# Patient Record
Sex: Male | Born: 1965 | Race: Black or African American | Hispanic: No | Marital: Single | Smoking: Current every day smoker
Health system: Southern US, Community
[De-identification: ages and names within clinical notes are randomized; demographics above are authoritative.]

## PROBLEM LIST (undated history)

## (undated) DIAGNOSIS — I10 Essential (primary) hypertension: Secondary | ICD-10-CM

## (undated) DIAGNOSIS — K219 Gastro-esophageal reflux disease without esophagitis: Secondary | ICD-10-CM

## (undated) DIAGNOSIS — F259 Schizoaffective disorder, unspecified: Secondary | ICD-10-CM

## (undated) DIAGNOSIS — K000 Anodontia: Secondary | ICD-10-CM

## (undated) DIAGNOSIS — F1910 Other psychoactive substance abuse, uncomplicated: Secondary | ICD-10-CM

## (undated) DIAGNOSIS — Z933 Colostomy status: Secondary | ICD-10-CM

## (undated) DIAGNOSIS — C801 Malignant (primary) neoplasm, unspecified: Secondary | ICD-10-CM

## (undated) DIAGNOSIS — K08109 Complete loss of teeth, unspecified cause, unspecified class: Secondary | ICD-10-CM

## (undated) DIAGNOSIS — D649 Anemia, unspecified: Secondary | ICD-10-CM

## (undated) DIAGNOSIS — Z22322 Carrier or suspected carrier of Methicillin resistant Staphylococcus aureus: Secondary | ICD-10-CM

## (undated) DIAGNOSIS — F250 Schizoaffective disorder, bipolar type: Secondary | ICD-10-CM

## (undated) HISTORY — PX: COLOSTOMY: SHX63

## (undated) HISTORY — PX: OTHER SURGICAL HISTORY: SHX169

---

## 2015-04-07 ENCOUNTER — Emergency Department: Payer: Medicaid Other

## 2015-04-07 ENCOUNTER — Encounter: Payer: Self-pay | Admitting: Emergency Medicine

## 2015-04-07 ENCOUNTER — Inpatient Hospital Stay
Admission: EM | Admit: 2015-04-07 | Discharge: 2015-04-09 | DRG: 378 | Payer: Medicaid Other | Attending: Internal Medicine | Admitting: Internal Medicine

## 2015-04-07 DIAGNOSIS — E870 Hyperosmolality and hypernatremia: Secondary | ICD-10-CM | POA: Diagnosis present

## 2015-04-07 DIAGNOSIS — Z22322 Carrier or suspected carrier of Methicillin resistant Staphylococcus aureus: Secondary | ICD-10-CM

## 2015-04-07 DIAGNOSIS — Z933 Colostomy status: Secondary | ICD-10-CM | POA: Diagnosis not present

## 2015-04-07 DIAGNOSIS — Z880 Allergy status to penicillin: Secondary | ICD-10-CM

## 2015-04-07 DIAGNOSIS — I10 Essential (primary) hypertension: Secondary | ICD-10-CM | POA: Diagnosis present

## 2015-04-07 DIAGNOSIS — F17200 Nicotine dependence, unspecified, uncomplicated: Secondary | ICD-10-CM | POA: Diagnosis present

## 2015-04-07 DIAGNOSIS — G8929 Other chronic pain: Secondary | ICD-10-CM | POA: Diagnosis present

## 2015-04-07 DIAGNOSIS — F259 Schizoaffective disorder, unspecified: Secondary | ICD-10-CM | POA: Diagnosis present

## 2015-04-07 DIAGNOSIS — K922 Gastrointestinal hemorrhage, unspecified: Principal | ICD-10-CM | POA: Diagnosis present

## 2015-04-07 DIAGNOSIS — K219 Gastro-esophageal reflux disease without esophagitis: Secondary | ICD-10-CM | POA: Diagnosis present

## 2015-04-07 DIAGNOSIS — Z79899 Other long term (current) drug therapy: Secondary | ICD-10-CM | POA: Diagnosis not present

## 2015-04-07 DIAGNOSIS — F319 Bipolar disorder, unspecified: Secondary | ICD-10-CM | POA: Diagnosis present

## 2015-04-07 DIAGNOSIS — Z9049 Acquired absence of other specified parts of digestive tract: Secondary | ICD-10-CM | POA: Diagnosis not present

## 2015-04-07 HISTORY — DX: Essential (primary) hypertension: I10

## 2015-04-07 HISTORY — DX: Gastro-esophageal reflux disease without esophagitis: K21.9

## 2015-04-07 HISTORY — DX: Schizoaffective disorder, bipolar type: F25.0

## 2015-04-07 HISTORY — DX: Schizoaffective disorder, unspecified: F25.9

## 2015-04-07 HISTORY — DX: Other psychoactive substance abuse, uncomplicated: F19.10

## 2015-04-07 HISTORY — DX: Colostomy status: Z93.3

## 2015-04-07 LAB — CBC
HCT: 33.5 % — ABNORMAL LOW (ref 40.0–52.0)
HCT: 32.5 % — ABNORMAL LOW (ref 40.0–52.0)
HEMOGLOBIN: 10.9 g/dL — AB (ref 13.0–18.0)
Hemoglobin: 10.8 g/dL — ABNORMAL LOW (ref 13.0–18.0)
MCH: 31.9 pg (ref 26.0–34.0)
MCH: 32.8 pg (ref 26.0–34.0)
MCHC: 32.4 g/dL (ref 32.0–36.0)
MCHC: 33.6 g/dL (ref 32.0–36.0)
MCV: 98.5 fL (ref 80.0–100.0)
MCV: 97.6 fL (ref 80.0–100.0)
PLATELETS: 226 10*3/uL (ref 150–440)
Platelets: 213 10*3/uL (ref 150–440)
RBC: 3.4 MIL/uL — ABNORMAL LOW (ref 4.40–5.90)
RBC: 3.33 MIL/uL — ABNORMAL LOW (ref 4.40–5.90)
RDW: 16 % — ABNORMAL HIGH (ref 11.5–14.5)
RDW: 15.7 % — AB (ref 11.5–14.5)
WBC: 8.7 10*3/uL (ref 3.8–10.6)
WBC: 7.1 10*3/uL (ref 3.8–10.6)

## 2015-04-07 LAB — VALPROIC ACID LEVEL: Valproic Acid Lvl: 87 ug/mL (ref 50.0–100.0)

## 2015-04-07 LAB — COMPREHENSIVE METABOLIC PANEL
ALK PHOS: 54 U/L (ref 38–126)
ALT: 11 U/L — AB (ref 17–63)
ANION GAP: 4 — AB (ref 5–15)
AST: 15 U/L (ref 15–41)
Albumin: 3.6 g/dL (ref 3.5–5.0)
BILIRUBIN TOTAL: 0.5 mg/dL (ref 0.3–1.2)
BUN: 12 mg/dL (ref 6–20)
CALCIUM: 9.8 mg/dL (ref 8.9–10.3)
CO2: 28 mmol/L (ref 22–32)
CREATININE: 1.43 mg/dL — AB (ref 0.61–1.24)
Chloride: 113 mmol/L — ABNORMAL HIGH (ref 101–111)
GFR, EST NON AFRICAN AMERICAN: 57 mL/min — AB (ref >60)
Glucose, Bld: 92 mg/dL (ref 65–99)
Potassium: 4.1 mmol/L (ref 3.5–5.1)
SODIUM: 145 mmol/L (ref 135–145)
TOTAL PROTEIN: 6.5 g/dL (ref 6.5–8.1)

## 2015-04-07 LAB — CBC WITH DIFFERENTIAL/PLATELET
Basophils Absolute: 0 10*3/uL (ref 0–0.1)
Basophils Relative: 0 %
EOS ABS: 0.3 10*3/uL (ref 0–0.7)
Eosinophils Relative: 4 %
HEMATOCRIT: 34.7 % — AB (ref 40.0–52.0)
HEMOGLOBIN: 11.3 g/dL — AB (ref 13.0–18.0)
LYMPHS ABS: 1.7 10*3/uL (ref 1.0–3.6)
LYMPHS PCT: 24 %
MCH: 31.9 pg (ref 26.0–34.0)
MCHC: 32.5 g/dL (ref 32.0–36.0)
MCV: 98.3 fL (ref 80.0–100.0)
MONOS PCT: 9 %
Monocytes Absolute: 0.7 10*3/uL (ref 0.2–1.0)
NEUTROS PCT: 63 %
Neutro Abs: 4.6 10*3/uL (ref 1.4–6.5)
Platelets: 220 10*3/uL (ref 150–440)
RBC: 3.53 MIL/uL — ABNORMAL LOW (ref 4.40–5.90)
RDW: 16.3 % — ABNORMAL HIGH (ref 11.5–14.5)
WBC: 7.4 10*3/uL (ref 3.8–10.6)

## 2015-04-07 LAB — TYPE AND SCREEN
ABO/RH(D): A POS
ANTIBODY SCREEN: NEGATIVE

## 2015-04-07 LAB — MRSA PCR SCREENING: MRSA BY PCR: POSITIVE — AB

## 2015-04-07 LAB — ABO/RH: ABO/RH(D): A POS

## 2015-04-07 MED ORDER — ONDANSETRON HCL 4 MG/2ML IJ SOLN: 4.0000 mg | Freq: Four times a day (QID) | INTRAMUSCULAR | Status: DC | PRN

## 2015-04-07 MED ORDER — LAMOTRIGINE 25 MG PO TABS
50.0000 mg | ORAL_TABLET | Freq: Two times a day (BID) | ORAL | Status: DC
  Administered 2015-04-07 – 2015-04-09 (×4): 50 mg via ORAL
  Filled 2015-04-07 (×4): qty 2

## 2015-04-07 MED ORDER — CLOZAPINE 100 MG PO TABS
300.0000 mg | ORAL_TABLET | Freq: Every day | ORAL | Status: DC
  Administered 2015-04-07 – 2015-04-08 (×2): 300 mg via ORAL
  Filled 2015-04-07 (×3): qty 3

## 2015-04-07 MED ORDER — CLOZAPINE 100 MG PO TABS
150.0000 mg | ORAL_TABLET | ORAL | Status: DC
  Administered 2015-04-08 – 2015-04-09 (×2): 150 mg via ORAL
  Filled 2015-04-07 (×3): qty 2

## 2015-04-07 MED ORDER — SODIUM CHLORIDE 0.9 % IV BOLUS (SEPSIS)
1000.0000 mL | Freq: Once | INTRAVENOUS | Status: AC
  Administered 2015-04-07: 1000 mL via INTRAVENOUS

## 2015-04-07 MED ORDER — SODIUM CHLORIDE 0.9 % IV SOLN
INTRAVENOUS | Status: DC
  Administered 2015-04-07 – 2015-04-08 (×2): via INTRAVENOUS

## 2015-04-07 MED ORDER — AMLODIPINE BESYLATE 10 MG PO TABS
10.0000 mg | ORAL_TABLET | Freq: Every day | ORAL | Status: DC
  Administered 2015-04-08 – 2015-04-09 (×2): 10 mg via ORAL
  Filled 2015-04-07 (×2): qty 1

## 2015-04-07 MED ORDER — ONDANSETRON HCL 4 MG PO TABS: 4.0000 mg | ORAL_TABLET | Freq: Four times a day (QID) | ORAL | Status: DC | PRN

## 2015-04-07 MED ORDER — GLYCOPYRROLATE 1 MG PO TABS
1.0000 mg | ORAL_TABLET | Freq: Three times a day (TID) | ORAL | Status: DC
  Administered 2015-04-07 – 2015-04-09 (×5): 1 mg via ORAL
  Filled 2015-04-07 (×7): qty 1

## 2015-04-07 MED ORDER — LAMOTRIGINE 25 MG PO TABS
50.0000 mg | ORAL_TABLET | Freq: Two times a day (BID) | ORAL | Status: DC
  Filled 2015-04-07: qty 2

## 2015-04-07 MED ORDER — MORPHINE SULFATE (PF) 2 MG/ML IV SOLN
1.0000 mg | Freq: Four times a day (QID) | INTRAVENOUS | Status: DC | PRN
  Administered 2015-04-07: 1 mg via INTRAVENOUS
  Filled 2015-04-07: qty 1

## 2015-04-07 MED ORDER — PANTOPRAZOLE SODIUM 40 MG PO TBEC
40.0000 mg | DELAYED_RELEASE_TABLET | Freq: Every day | ORAL | Status: DC
  Administered 2015-04-08 – 2015-04-09 (×2): 40 mg via ORAL
  Filled 2015-04-07 (×2): qty 1

## 2015-04-07 MED ORDER — CHLORHEXIDINE GLUCONATE CLOTH 2 % EX PADS
6.0000 | MEDICATED_PAD | Freq: Every day | CUTANEOUS | Status: DC
  Administered 2015-04-08 – 2015-04-09 (×2): 6 via TOPICAL

## 2015-04-07 MED ORDER — SODIUM CHLORIDE 0.9 % IV SOLN
80.0000 mg | Freq: Once | INTRAVENOUS | Status: AC
  Administered 2015-04-07: 80 mg via INTRAVENOUS
  Filled 2015-04-07 (×2): qty 80

## 2015-04-07 MED ORDER — MUPIROCIN 2 % EX OINT
1.0000 "application " | TOPICAL_OINTMENT | Freq: Two times a day (BID) | CUTANEOUS | Status: DC
  Administered 2015-04-08 – 2015-04-09 (×4): 1 via NASAL
  Filled 2015-04-07 (×2): qty 22

## 2015-04-07 MED ORDER — ADULT MULTIVITAMIN W/MINERALS CH
1.0000 | ORAL_TABLET | Freq: Every day | ORAL | Status: DC
  Administered 2015-04-08 – 2015-04-09 (×2): 1 via ORAL
  Filled 2015-04-07 (×3): qty 1

## 2015-04-07 MED ORDER — IOHEXOL 300 MG/ML  SOLN
100.0000 mL | Freq: Once | INTRAMUSCULAR | Status: AC | PRN
  Administered 2015-04-07: 100 mL via INTRAVENOUS

## 2015-04-07 MED ORDER — IOHEXOL 240 MG/ML SOLN
25.0000 mL | Freq: Once | INTRAMUSCULAR | Status: AC | PRN
  Administered 2015-04-07: 25 mL via ORAL

## 2015-04-07 MED ORDER — PANTOPRAZOLE SODIUM 40 MG PO TBEC
40.0000 mg | DELAYED_RELEASE_TABLET | Freq: Every day | ORAL | Status: DC
Start: 2015-04-07 — End: 2015-04-07

## 2015-04-07 MED ORDER — VALPROIC ACID 250 MG PO CAPS
750.0000 mg | ORAL_CAPSULE | Freq: Two times a day (BID) | ORAL | Status: DC
  Administered 2015-04-07 – 2015-04-08 (×2): 750 mg via ORAL
  Filled 2015-04-07 (×2): qty 3

## 2015-04-07 MED ORDER — AMLODIPINE BESYLATE 10 MG PO TABS
10.0000 mg | ORAL_TABLET | Freq: Every day | ORAL | Status: DC
  Filled 2015-04-07 (×2): qty 2

## 2015-04-07 MED ORDER — VALPROIC ACID 250 MG PO CAPS
750.0000 mg | ORAL_CAPSULE | Freq: Three times a day (TID) | ORAL | Status: DC
  Filled 2015-04-07 (×2): qty 3

## 2015-04-07 NOTE — H&P (Signed)
[place] [facility] NAME: Mario Ortega    MR#:  [id number]  DATE OF BIRTH:  10-26-1965  DATE OF ADMISSION:  04/07/2015  PRIMARY CARE PHYSICIAN: Christie Nottingham., PA   REQUESTING/REFERRING PHYSICIAN: [email or web address]  CHIEF COMPLAINT:   Chief Complaint  Patient presents with  . Rectal Bleeding    HISTORY OF PRESENT ILLNESS:  Mario Ortega  is a 49 y.o. male with a known history of hypertension, schizoaffective disorder with bipolar disorder, history of recent bowel perforation requiring colostomy comes from family care home because of bloody stool in his colostomy bag. Patient is a poor historian, according to the staff at family care home patient the by mouth intake is poor since yesterday associated mild abdominal pain, today morning when they tried to change her colostomy began to noticed blood in the colostomy bag mixed with stool. PAST MEDICAL HISTORY:   Past Medical History  Diagnosis Date  . Colostomy in place [facility])   . Hypertension   . Schizo affective schizophrenia ([place])   . GERD (gastroesophageal reflux disease)   . Drug abuse     PAST SURGICAL HISTOIRY:  History reviewed. No pertinent past surgical history.  SOCIAL HISTORY:   Social History  Substance Use Topics  . Smoking status: Current Every Day Smoker  . Smokeless tobacco: Not on file  . Alcohol Use: No    FAMILY HISTORY:  History reviewed. No pertinent family history.  DRUG ALLERGIES:   Allergies  Allergen Reactions  . Penicillins Rash    REVIEW OF SYSTEMS:  CONSTITUTIONAL: No fever, fatigue or weakness.  EYES: No blurred or double vision.  EARS, NOSE, AND THROAT: No tinnitus or ear pain.  RESPIRATORY: No cough, shortness of breath, wheezing or hemoptysis.  CARDIOVASCULAR: No chest pain, orthopnea, edema.  GASTROINTESTINAL:Complains of abdominal pain.  : No dysuria, hematuria.  ENDOCRINE: No polyuria, nocturia,  HEMATOLOGY: No anemia, easy  bruising or bleeding SKIN: No rash or lesion. MUSCULOSKELETAL: No joint pain or arthritis.   NEUROLOGIC: No tingling, numbness, weakness.  PSYCHIATRY: No anxiety or depression.   MEDICATIONS AT HOME:   Prior to Admission medications   Medication Sig Start Date End Date Taking? Authorizing Provider  amLODipine (NORVASC) 10 MG tablet Take 10 mg by mouth daily.   Yes Historical Provider, MD  cloZAPine (CLOZARIL) 100 MG tablet Take 150 mg by mouth every morning.   Yes Historical Provider, MD  clozapine (CLOZARIL) 200 MG tablet Take 300 mg by mouth at bedtime.   Yes Historical Provider, MD  glycopyrrolate (ROBINUL) 1 MG tablet Take 1 mg by mouth 3 (three) times daily.   Yes Historical Provider, MD  lamoTRIgine (LAMICTAL) 100 MG tablet Take 50 mg by mouth 2 (two) times daily.   Yes Historical Provider, MD  Multiple Vitamins-Minerals (MULTIVITAMIN WITH MINERALS) tablet Take 1 tablet by mouth daily.   Yes Historical Provider, MD  omeprazole (PRILOSEC) 20 MG capsule Take 20 mg by mouth every morning.   Yes Historical Provider, MD  valproic acid (DEPAKENE) 250 MG capsule Take 750 mg by mouth 3 (three) times daily.   Yes Historical Provider, MD      VITAL SIGNS:  Blood pressure 129/89, pulse 78, temperature 98 F (36.7 C), temperature source Oral, resp. rate 12, height 5\' 11"  (1.803 m), weight 69.854 kg (154 lb), SpO2 100 %.  PHYSICAL EXAMINATION:  GENERAL:  49 y.o.-year-old patient lying in the bed with no acute distress.  EYES: Pupils equal, round, reactive  to light and accommodation. No scleral icterus. Extraocular muscles intact.  HEENT: Head atraumatic, normocephalic. Oropharynx and nasopharynx clear.  NECK:  Supple, no jugular venous distention. No thyroid enlargement, no tenderness.  LUNGS: Normal breath sounds bilaterally, no wheezing, rales,rhonchi or crepitation. No use of accessory muscles of respiration.  CARDIOVASCULAR: S1, S2 normal. No murmurs, rubs, or gallops.  ABDOMEN: Soft  nontender, colostomy bag filled with stool and blood present in the right lower quadrant. Stoma site is clean without evidence of infection. EXTREMITIES: No pedal edema, cyanosis, or clubbing.  NEUROLOGIC: Cranial nerves II through XII are intact. Muscle strength 5/5 in all extremities. Sensation intact. Gait not checked.  PSYCHIATRIC: The patient is alert and oriented x 3.  SKIN: No obvious rash, lesion, or ulcer.   LABORATORY PANEL:   CBC  Recent Labs Lab 04/07/15 1010  WBC 7.4  HGB 11.3*  HCT 34.7*  PLT 220   ------------------------------------------------------------------------------------------------------------------  Chemistries   Recent Labs Lab 04/07/15 1010  NA 145  K 4.1  CL 113*  CO2 28  GLUCOSE 92  BUN 12  CREATININE 1.43*  CALCIUM 9.8  AST 15  ALT 11*  ALKPHOS 54  BILITOT 0.5   ------------------------------------------------------------------------------------------------------------------  Cardiac Enzymes No results for input(s): TROPONINI in the last 168 hours. ------------------------------------------------------------------------------------------------------------------  RADIOLOGY:  Ct Abdomen Pelvis W Contrast  04/07/2015   CLINICAL DATA:  Generalized abdominal and pelvic pain, bleeding from colostomy, smoker  EXAM: CT ABDOMEN AND PELVIS WITH CONTRAST  TECHNIQUE: Multidetector CT imaging of the abdomen and pelvis was performed using the standard protocol following bolus administration of intravenous contrast. Sagittal and coronal MPR images reconstructed from axial data set.  CONTRAST:  113mL OMNIPAQUE IOHEXOL 300 MG/ML SOLN IV. Dilute oral contrast.  COMPARISON:  None  FINDINGS: Atelectasis versus infiltrate in posterior RIGHT lower lobe.  Tiny nonobstructing LEFT renal calculi.  6 mm lesion anterior mid LEFT kidney image 30, question tiny cyst.  Liver, spleen, pancreas, kidneys, and adrenal glands otherwise unremarkable.  Contracted gallbladder  containing intermediate attenuation material, unable to exclude stones or sludge.  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  RIGHT lower quadrant ostomy question ileostomy.  Stomach and small bowel loops normal appearance.  Minimal stranding at the LEFT pericolic gutter may be related to recent colon surgery.  No mass, adenopathy, free fluid, free air, or inflammatory process.  Postsurgical changes of the anterior abdominal wall at midline.  Bladder and ureters unremarkable.  No hernia or acute bone lesion.  IMPRESSION: RIGHT lower quadrant ostomy question ileostomy with suspect prior subtotal colectomy.  Tiny nonobstructing LEFT renal calculi.  Question sludge versus noncalcified calculi in gallbladder.   Electronically Signed   By: Lavonia Dana M.D.   On: 04/07/2015 11:55    EKG:  No orders found for this or any previous visit.  IMPRESSION AND PLAN:  1. GI bleed likely lower GI bleed: Patient has history of bipolar and schizoaffective desires were not able to give a history but the patient to the will be admitted for GI bleed because of the blood in the colostomy bag. He'll have a CBC every 6 hours, nothing by mouth, IV fluids. GI consult . Continue IV PPIs.\  #2 history of bowel perforation with recent stop stopped her to colectomy and ileostomy: Patient was at [facility], regional  Recently.  3. Bipolar disorder, schizoaffective disorder: Patient had recent lithium toxicity and was at [facility] for about a month and recently discharged on Friday from there to family  Care home,  #4 hypertension controlled   All the records are reviewed and case discussed with ED provider. Management plans discussed with the patient, family and they are in agreement.  CODE STATUS:Full   TOTAL TIME TAKING CARE OF THIS PATIENT: 55 minutes.    Epifanio Lesches M.D on 04/07/2015 at 1:09 PM  Between 7am to 6pm - Pager - [phone]  After 6pm go to [email or web address] - password  [facility]  [place] [facility]  Office  [phone]  CC: Primary care physician; Christie Nottingham., PA

## 2015-04-07 NOTE — Plan of Care (Signed)
Problem: Discharge Progression Outcomes Goal: Other Discharge Outcomes/Goals Outcome: Progressing Plan of Care Progressing to Goal: Pain medications given with noted relief. VSS. Remains NPO. IV fluids infusing.

## 2015-04-07 NOTE — Progress Notes (Addendum)
Dr Vianne Bulls -1 mg morphine IV everys 6 hours PRN, valproic acid 750 mg oral BID, cancel other valproic acid order

## 2015-04-07 NOTE — ED Notes (Signed)
Right lower quadrant colostomy changed due to leakage.

## 2015-04-07 NOTE — ED Notes (Signed)
Pt returned from CT °

## 2015-04-07 NOTE — ED Notes (Signed)
Patient transported to CT 

## 2015-04-07 NOTE — Progress Notes (Signed)
MEDICATION RELATED CONSULT NOTE - INITIAL   Pharmacy Consult for clozapine monitoring  Allergies  Allergen Reactions  . Penicillins Rash   Labs:  Recent Labs  04/07/15 1010  WBC 7.4  HGB 11.3*  HCT 34.7*  PLT 220  CREATININE 1.43*  ALBUMIN 3.6  PROT 6.5  AST 15  ALT 11*  ALKPHOS 54  BILITOT 0.5   Estimated Creatinine Clearance: 62.5 mL/min (by C-G formula based on Cr of 1.43).  Medical History: Past Medical History  Diagnosis Date  . Colostomy in place [facility])   . Hypertension   . Schizo affective schizophrenia ([place])   . GERD (gastroesophageal reflux disease)   . Drug abuse     Medications:  Scheduled:  . amLODipine  10 mg Oral Daily  . [START ON 04/08/2015] cloZAPine  150 mg Oral BH-q7a  . clozapine  300 mg Oral QHS  . glycopyrrolate  1 mg Oral TID  . lamoTRIgine  50 mg Oral BID  . multivitamin with minerals  1 tablet Oral Daily  . pantoprazole  40 mg Oral Daily  . valproic acid  750 mg Oral TID    Assessment: Pharmacy consult entered to monitor [facility] on this 49 year old patient admitted for a GI bleed. Patient was taking clozapine prior to admission. Pharmacy will monitor and report lab values to the clozapine REMS program.  Plan:  Patient was admitted from family care home for GI bleed. Patient was taking clozapine prior to admission.   Labs obtained today: WBC 4.7, ANC 4600  Labs have been reported to the clozapine REMS registry, labs have been saved, patient meets criteria to dispense medication and is currently on weekly monitoring.   Continue home dose of clozapine 150 mg qAM and 300 mg qHS. Pharmacy will continue to monitor.  Darylene Price Lynde Ludwig 04/07/2015,2:58 PM

## 2015-04-07 NOTE — ED Notes (Signed)
Pt to ed with c/o bloody stools from colostomy bag since this am.  Pt with reddish color stools. Denies abd pain.

## 2015-04-07 NOTE — ED Provider Notes (Signed)
CSN: [id number]     Arrival date & time 04/07/15  0915 History   First MD Initiated Contact with Patient 04/07/15 [phone]     Chief Complaint  Patient presents with  . Rectal Bleeding     (Consider location/radiation/quality/duration/timing/severity/associated sxs/prior Treatment) The history is provided by the patient, the EMS personnel and a caregiver.  Mario Ortega is a 49 y.o. male hx of schizoaffective, recent bowel perforation causing sepsis requiring colostomy bag that during regional here presenting with blood in colostomy bag. He woke up this morning and the colostomy bag fell off and he noticed bloody stools in the bag. Has chronic abdominal pain is getting worse but denies any vomiting or fevers. States that he is still passing gas. He is not on any blood thinners. He is still on Clozaril, lamictal, and depakote. During the hospitalization at [facility], he was also found to be lithium toxic and briefly required dialysis but no longer on dialysis.    Past Medical History  Diagnosis Date  . Colostomy in place [facility])   . Hypertension   . Schizo affective schizophrenia ([place])   . GERD (gastroesophageal reflux disease)   . Drug abuse    History reviewed. No pertinent past surgical history. History reviewed. No pertinent family history. Social History  Substance Use Topics  . Smoking status: Current Every Day Smoker  . Smokeless tobacco: None  . Alcohol Use: No    Review of Systems  Gastrointestinal: Positive for blood in stool and hematochezia.  All other systems reviewed and are negative.     Allergies  Penicillins  Home Medications   Prior to Admission medications   Medication Sig Start Date End Date Taking? Authorizing Provider  amLODipine (NORVASC) 10 MG tablet Take 10 mg by mouth daily.   Yes Historical Provider, MD  cloZAPine (CLOZARIL) 100 MG tablet Take 150 mg by mouth every morning.   Yes Historical Provider, MD  clozapine (CLOZARIL) 200 MG tablet  Take 300 mg by mouth at bedtime.   Yes Historical Provider, MD  glycopyrrolate (ROBINUL) 1 MG tablet Take 1 mg by mouth 3 (three) times daily.   Yes Historical Provider, MD  lamoTRIgine (LAMICTAL) 100 MG tablet Take 50 mg by mouth 2 (two) times daily.   Yes Historical Provider, MD  Multiple Vitamins-Minerals (MULTIVITAMIN WITH MINERALS) tablet Take 1 tablet by mouth daily.   Yes Historical Provider, MD  omeprazole (PRILOSEC) 20 MG capsule Take 20 mg by mouth every morning.   Yes Historical Provider, MD  valproic acid (DEPAKENE) 250 MG capsule Take 750 mg by mouth 3 (three) times daily.   Yes Historical Provider, MD   BP 129/89 mmHg  Pulse 78  Temp(Src) 98 F (36.7 C) (Oral)  Resp 12  Ht 5\' 11"  (1.803 m)  Wt 154 lb (69.854 kg)  BMI 21.49 kg/m2  SpO2 100% Physical Exam  Constitutional: He is oriented to person, place, and time.  Chronically ill, dehydrated   HENT:  Head: Normocephalic.  MM dry, slightly pale   Eyes: EOM are normal. Pupils are equal, round, and reactive to light.  Slightly pale   Neck: Normal range of motion. Neck supple.  Cardiovascular: Normal rate, regular rhythm and normal heart sounds.   Pulmonary/Chest: Effort normal and breath sounds normal. No respiratory distress. He has no wheezes. He has no rales.  Abdominal:  Multiple surgical scars. Colostomy bag with reddish stool. Guiac positive   Musculoskeletal: Normal range of motion. He exhibits no edema or tenderness.  Neurological:  He is alert and oriented to person, place, and time. No cranial nerve deficit. Coordination normal.  Skin: Skin is warm and dry.  Psychiatric:  Unable   Nursing note and vitals reviewed.   ED Course  Procedures (including critical care time) Labs Review Labs Reviewed  CBC WITH DIFFERENTIAL/PLATELET - Abnormal; Notable for the following:    RBC 3.53 (*)    Hemoglobin 11.3 (*)    HCT 34.7 (*)    RDW 16.3 (*)    All other components within normal limits  COMPREHENSIVE METABOLIC  PANEL - Abnormal; Notable for the following:    Chloride 113 (*)    Creatinine, Ser 1.43 (*)    ALT 11 (*)    GFR calc non Af Amer 57 (*)    Anion gap 4 (*)    All other components within normal limits  VALPROIC ACID LEVEL  LAMOTRIGINE LEVEL  TYPE AND SCREEN  ABO/RH    Imaging Review Ct Abdomen Pelvis W Contrast  04/07/2015   CLINICAL DATA:  Generalized abdominal and pelvic pain, bleeding from colostomy, smoker  EXAM: CT ABDOMEN AND PELVIS WITH CONTRAST  TECHNIQUE: Multidetector CT imaging of the abdomen and pelvis was performed using the standard protocol following bolus administration of intravenous contrast. Sagittal and coronal MPR images reconstructed from axial data set.  CONTRAST:  111mL OMNIPAQUE IOHEXOL 300 MG/ML SOLN IV. Dilute oral contrast.  COMPARISON:  None  FINDINGS: Atelectasis versus infiltrate in posterior RIGHT lower lobe.  Tiny nonobstructing LEFT renal calculi.  6 mm lesion anterior mid LEFT kidney image 30, question tiny cyst.  Liver, spleen, pancreas, kidneys, and adrenal glands otherwise unremarkable.  Contracted gallbladder containing intermediate attenuation material, unable to exclude stones or sludge.  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  RIGHT lower quadrant ostomy question ileostomy.  Stomach and small bowel loops normal appearance.  Minimal stranding at the LEFT pericolic gutter may be related to recent colon surgery.  No mass, adenopathy, free fluid, free air, or inflammatory process.  Postsurgical changes of the anterior abdominal wall at midline.  Bladder and ureters unremarkable.  No hernia or acute bone lesion.  IMPRESSION: RIGHT lower quadrant ostomy question ileostomy with suspect prior subtotal colectomy.  Tiny nonobstructing LEFT renal calculi.  Question sludge versus noncalcified calculi in gallbladder.   Electronically Signed   By: Lavonia Dana M.D.   On: 04/07/2015 11:55   I have personally reviewed and evaluated these images and lab  results as part of my medical decision-making.   EKG Interpretation None      MDM   Final diagnoses:  None   Mario Ortega is a 49 y.o. male here with blood in stool. Guiac positive, grossly bloody. Consider bleeding from anatamosis site vs GI bleed. Will check labs, type and screen, CT ab/pel. Will likely admit.   12:43 PM Hg 11. Given protonix. CT showed no obvious source of bleeding. Will admit for GI bleed.    Wandra Arthurs, MD 04/07/15 1245

## 2015-04-08 LAB — CBC
HCT: 32.2 % — ABNORMAL LOW (ref 40.0–52.0)
HCT: 34.5 % — ABNORMAL LOW (ref 40.0–52.0)
HEMOGLOBIN: 10.7 g/dL — AB (ref 13.0–18.0)
Hemoglobin: 11.2 g/dL — ABNORMAL LOW (ref 13.0–18.0)
MCH: 33.2 pg (ref 26.0–34.0)
MCH: 32.2 pg (ref 26.0–34.0)
MCHC: 33.2 g/dL (ref 32.0–36.0)
MCHC: 32.4 g/dL (ref 32.0–36.0)
MCV: 99.8 fL (ref 80.0–100.0)
MCV: 99.2 fL (ref 80.0–100.0)
PLATELETS: 220 10*3/uL (ref 150–440)
Platelets: 206 10*3/uL (ref 150–440)
RBC: 3.22 MIL/uL — AB (ref 4.40–5.90)
RBC: 3.48 MIL/uL — ABNORMAL LOW (ref 4.40–5.90)
RDW: 16.3 % — ABNORMAL HIGH (ref 11.5–14.5)
RDW: 16.3 % — ABNORMAL HIGH (ref 11.5–14.5)
WBC: 6.6 10*3/uL (ref 3.8–10.6)
WBC: 10.2 10*3/uL (ref 3.8–10.6)

## 2015-04-08 LAB — GLUCOSE, CAPILLARY: GLUCOSE-CAPILLARY: 71 mg/dL (ref 65–99)

## 2015-04-08 LAB — COMPREHENSIVE METABOLIC PANEL
ALBUMIN: 3 g/dL — AB (ref 3.5–5.0)
ALK PHOS: 43 U/L (ref 38–126)
ALT: 8 U/L — AB (ref 17–63)
AST: 11 U/L — AB (ref 15–41)
Anion gap: 5 (ref 5–15)
BUN: 11 mg/dL (ref 6–20)
CALCIUM: 9.6 mg/dL (ref 8.9–10.3)
CO2: 28 mmol/L (ref 22–32)
CREATININE: 1.28 mg/dL — AB (ref 0.61–1.24)
Chloride: 118 mmol/L — ABNORMAL HIGH (ref 101–111)
GFR calc Af Amer: >60 mL/min (ref >60)
GFR calc non Af Amer: >60 mL/min (ref >60)
GLUCOSE: 72 mg/dL (ref 65–99)
Potassium: 4.2 mmol/L (ref 3.5–5.1)
SODIUM: 151 mmol/L — AB (ref 135–145)
Total Bilirubin: 0.2 mg/dL — ABNORMAL LOW (ref 0.3–1.2)
Total Protein: 5.4 g/dL — ABNORMAL LOW (ref 6.5–8.1)

## 2015-04-08 MED ORDER — VALPROIC ACID 250 MG PO CAPS
750.0000 mg | ORAL_CAPSULE | Freq: Three times a day (TID) | ORAL | Status: DC
  Administered 2015-04-08 – 2015-04-09 (×3): 750 mg via ORAL
  Filled 2015-04-08 (×3): qty 3

## 2015-04-08 MED ORDER — DEXTROSE 5 % IV SOLN
INTRAVENOUS | Status: DC
  Administered 2015-04-08: 15:00:00 via INTRAVENOUS

## 2015-04-08 MED ORDER — PEG 3350-KCL-NA BICARB-NACL 420 G PO SOLR
4000.0000 mL | Freq: Once | ORAL | Status: DC
  Filled 2015-04-08: qty 4000

## 2015-04-08 NOTE — Plan of Care (Signed)
Problem: Discharge Progression Outcomes Goal: Other Discharge Outcomes/Goals Outcome: Progressing Pt is alert to self and location, no c/o pain at this time. Group home assistant at bedside, supportive. Positive for MRSA PCR, contact precautions initiated. Resting quietly, VSS.

## 2015-04-08 NOTE — Clinical Social Work Note (Signed)
Patient is from a [facility]. CSW completed FL2 to place on patient's chart but chart was unavailable. Full CSW assessment to follow. Shela Leff MSW,LCSW [phone]

## 2015-04-08 NOTE — Consult Note (Signed)
  Pt seen and examined. Please see D. Carlye Grippe' notes. No active bleeding right now. Green stool via colostomy. However, if pt wants can proceed with colonoscopy tomorrow after bowel prep. Thanks.

## 2015-04-08 NOTE — Plan of Care (Signed)
Problem: Discharge Progression Outcomes Goal: Other Discharge Outcomes/Goals Outcome: Progressing Plan of care progress to goal:  Patient is confused. No complaints of pain. NS discontinued - D5 started at 50 ml/hr. Diet advanced to clear liquid diet. Contact precautions remain for positive MRSA by PCR. GI consulted.

## 2015-04-08 NOTE — Progress Notes (Signed)
Patient ID: Mario Ortega, male   DOB: 09/20/1965, 49 y.o.   MRN: [id number] [facility] PROGRESS NOTE  PCP: Christie Nottingham., PA  HPI/Subjective: Patient asking for something to drink. No further bleeding. Hemoglobin remained stable. Patient is a poor historian. His sodium is very high at this point.  Objective: Filed Vitals:   04/08/15 0504  BP: 117/75  Pulse: 74  Temp: 98.1 F (36.7 C)  Resp: 18    Filed Weights   04/07/15 0925 04/07/15 1528  Weight: 69.854 kg (154 lb) 68.153 kg (150 lb 4 oz)    ROS: Review of Systems  Constitutional: Negative for fever and chills.  Eyes: Negative for blurred vision.  Respiratory: Negative for cough and shortness of breath.   Cardiovascular: Negative for chest pain.  Gastrointestinal: Positive for blood in stool. Negative for nausea, vomiting, abdominal pain, diarrhea and constipation.  Genitourinary: Negative for dysuria.  Musculoskeletal: Negative for joint pain.  Neurological: Negative for dizziness and headaches.   Exam: Physical Exam  HENT:  Nose: No mucosal edema.  Mouth/Throat: No oropharyngeal exudate or posterior oropharyngeal edema.  Eyes: Conjunctivae, EOM and lids are normal. Pupils are equal, round, and reactive to light.  Neck: No JVD present. Carotid bruit is not present. No edema present. No thyroid mass and no thyromegaly present.  Cardiovascular: S1 normal and S2 normal.  Exam reveals no gallop.   Murmur heard.  Systolic murmur is present with a grade of 2/6  Pulses:      Dorsalis pedis pulses are 2+ on the right side, and 2+ on the left side.  Respiratory: No respiratory distress. He has no wheezes. He has no rhonchi. He has no rales.  GI: Soft. Bowel sounds are normal. There is no tenderness.  Musculoskeletal:       Right ankle: He exhibits no swelling.       Left ankle: He exhibits no swelling.  Lymphadenopathy:    He has no cervical adenopathy.  Neurological: He is alert. No cranial nerve  deficit.  Skin: Skin is warm. No rash noted. Nails show no clubbing.  Psychiatric: He has a normal mood and affect.    Data Reviewed: Basic Metabolic Panel:  Recent Labs Lab 04/07/15 1010 04/08/15 0438  NA 145 151*  K 4.1 4.2  CL 113* 118*  CO2 28 28  GLUCOSE 92 72  BUN 12 11  CREATININE 1.43* 1.28*  CALCIUM 9.8 9.6   Liver Function Tests:  Recent Labs Lab 04/07/15 1010 04/08/15 0438  AST 15 11*  ALT 11* 8*  ALKPHOS 54 43  BILITOT 0.5 0.2*  PROT 6.5 5.4*  ALBUMIN 3.6 3.0*   CBC:  Recent Labs Lab 04/07/15 1010 04/07/15 1510 04/07/15 1855 04/08/15 0439 04/08/15 1024  WBC 7.4 8.7 7.1 6.6 10.2  NEUTROABS 4.6  --   --   --   --   HGB 11.3* 10.8* 10.9* 10.7* 11.2*  HCT 34.7* 33.5* 32.5* 32.2* 34.5*  MCV 98.3 98.5 97.6 99.8 99.2  PLT 220 226 213 206 220    CBG:  Recent Labs Lab 04/08/15 0736  GLUCAP 71    Recent Results (from the past 240 hour(s))  MRSA PCR Screening     Status: Abnormal   Collection Time: 04/07/15  7:50 PM  Result Value Ref Range Status   MRSA by PCR POSITIVE (A) NEGATIVE Final    Comment:        The GeneXpert MRSA Assay (FDA approved for NASAL specimens only), is one  component of a comprehensive MRSA colonization surveillance program. It is not intended to diagnose MRSA infection nor to guide or monitor treatment for MRSA infections. CRITICAL RESULT CALLED TO, READ BACK BY AND VERIFIED WITH: Emeterio Reeve RN @ 2115 04/07/2015      Studies: Ct Abdomen Pelvis W Contrast  04/07/2015   CLINICAL DATA:  Generalized abdominal and pelvic pain, bleeding from colostomy, smoker  EXAM: CT ABDOMEN AND PELVIS WITH CONTRAST  TECHNIQUE: Multidetector CT imaging of the abdomen and pelvis was performed using the standard protocol following bolus administration of intravenous contrast. Sagittal and coronal MPR images reconstructed from axial data set.  CONTRAST:  179mL OMNIPAQUE IOHEXOL 300 MG/ML SOLN IV. Dilute oral contrast.  COMPARISON:   None  FINDINGS: Atelectasis versus infiltrate in posterior RIGHT lower lobe.  Tiny nonobstructing LEFT renal calculi.  6 mm lesion anterior mid LEFT kidney image 30, question tiny cyst.  Liver, spleen, pancreas, kidneys, and adrenal glands otherwise unremarkable.  Contracted gallbladder containing intermediate attenuation material, unable to exclude stones or sludge.  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  RIGHT lower quadrant ostomy question ileostomy.  Stomach and small bowel loops normal appearance.  Minimal stranding at the LEFT pericolic gutter may be related to recent colon surgery.  No mass, adenopathy, free fluid, free air, or inflammatory process.  Postsurgical changes of the anterior abdominal wall at midline.  Bladder and ureters unremarkable.  No hernia or acute bone lesion.  IMPRESSION: RIGHT lower quadrant ostomy question ileostomy with suspect prior subtotal colectomy.  Tiny nonobstructing LEFT renal calculi.  Question sludge versus noncalcified calculi in gallbladder.   Electronically Signed   By: Lavonia Dana M.D.   On: 04/07/2015 11:55    Scheduled Meds: . amLODipine  10 mg Oral Daily  . Chlorhexidine Gluconate Cloth  6 each Topical Q0600  . cloZAPine  150 mg Oral BH-q7a  . clozapine  300 mg Oral QHS  . glycopyrrolate  1 mg Oral TID  . lamoTRIgine  50 mg Oral BID  . multivitamin with minerals  1 tablet Oral Daily  . mupirocin ointment  1 application Nasal BID  . pantoprazole  40 mg Oral Daily  . valproic acid  750 mg Oral BID   Continuous Infusions: . dextrose      Assessment/Plan:  1. Hypernatremia. Patient is on D5 drip. I will start on clear liquid diet at this point. Check a sodium again tomorrow morning. 2. Blood seen in the colostomy. Hemoglobin is stable. Awaiting GI consultation. Since the patient is in a group home will allow GI to see the patient while here and assess whether colonoscopy is needed. 3. Schizophrenia- continue all psychiatric  medications 4. Gastroesophageal reflux disease without esophagitis on PPI.  Code Status:     Code Status Orders        Start     Ordered   04/07/15 1307  Full code   Continuous     04/07/15 1308     Disposition Plan: Likely back to group home tomorrow  Consultants:  Gastroenterology  Time spent: 25 minutes  [place] [place] [facility]

## 2015-04-08 NOTE — Consult Note (Signed)
  Pt now refusing to drink bowel prep or sign the consent for colonoscopy for tomorrow. Pt having green stools. Non bloody. Will resume clear liquid diet. Since pt refusing colonoscopy, will sign off. Thanks.

## 2015-04-08 NOTE — Progress Notes (Signed)
Dr. Candace Cruise notified patient refuses to do colonoscopy and prep scheduled for tomorrow. Hemoglobin is normal levels, stool from colostomy is green. Continue with clear liquid diet.

## 2015-04-08 NOTE — Consult Note (Signed)
GI Inpatient Consult Note  Reason for Consult: GI Bleed   Attending Requesting Consult: Dr. Epifanio Lesches  History of Present Illness: Mario Ortega is a 49 y.o. male patient admitted for further evaluation presented to the Emergency Room for evaluation of blood noted in his colostomy bag.  Per history documented in the chart, patient was brought to the Emergency Room from a family care home after the staff noted bloody stool in colostomy bag. Today he denies any abdominal pain. However in review of his H & P, the staff at the family care home noted that he had a poor appetite and mild abdominal pain the day before. CT of Abd & Pelvis results noted.   Past Medical History:  Past Medical History  Diagnosis Date  . Colostomy in place [facility])   . Hypertension   . Schizo affective schizophrenia ([place])   . GERD (gastroesophageal reflux disease)   . Drug abuse     Problem List: Patient Active Problem List   Diagnosis Date Noted  . GI bleed 04/07/2015    Past Surgical History: History reviewed. No pertinent past surgical history.  Allergies: Allergies  Allergen Reactions  . Penicillins Rash    Home Medications: Prescriptions prior to admission  Medication Sig Dispense Refill Last Dose  . amLODipine (NORVASC) 10 MG tablet Take 10 mg by mouth daily.   04/07/2015 at 0800  . cloZAPine (CLOZARIL) 100 MG tablet Take 150 mg by mouth every morning.   04/07/2015 at 0800  . clozapine (CLOZARIL) 200 MG tablet Take 300 mg by mouth at bedtime.   04/06/2015 at 2000  . glycopyrrolate (ROBINUL) 1 MG tablet Take 1 mg by mouth 3 (three) times daily.   04/07/2015 at 0800  . lamoTRIgine (LAMICTAL) 100 MG tablet Take 50 mg by mouth 2 (two) times daily.   04/07/2015 at 0800  . Multiple Vitamins-Minerals (MULTIVITAMIN WITH MINERALS) tablet Take 1 tablet by mouth daily.   04/07/2015 at 0800  . omeprazole (PRILOSEC) 20 MG capsule Take 20 mg by mouth every morning.   04/07/2015 at 0800  . valproic acid  (DEPAKENE) 250 MG capsule Take 750 mg by mouth 3 (three) times daily.   04/07/2015 at 0800   Home medication reconciliation was completed with the patient.   Scheduled Inpatient Medications:   . amLODipine  10 mg Oral Daily  . Chlorhexidine Gluconate Cloth  6 each Topical Q0600  . cloZAPine  150 mg Oral [id number]  . clozapine  300 mg Oral QHS  . glycopyrrolate  1 mg Oral TID  . lamoTRIgine  50 mg Oral BID  . multivitamin with minerals  1 tablet Oral Daily  . mupirocin ointment  1 application Nasal BID  . pantoprazole  40 mg Oral Daily  . valproic acid  750 mg Oral TID    Continuous Inpatient Infusions:   . dextrose 50 mL/hr at 04/08/15 1455    PRN Inpatient Medications:  morphine injection, ondansetron **OR** ondansetron (ZOFRAN) IV  Family History: family history is not on file.  The patient's family history is negative for inflammatory bowel disorders, GI malignancy, or solid organ transplantation.  Social History:  Reports that he has been smoking.  He does not have any smokeless tobacco history on file. He reports that he does not drink alcohol or use illicit drugs.   Review of Systems:  Poor historian.  Constitutional: Thin.  Eyes: No changes in vision. ENT: No oral lesions, sore throat.  GI: see HPI.  Heme/Lymph: No easy bruising.  CV: No chest pain.  GU: No hematuria.  Integumentary: No rashes.  Neuro: No headaches.  Psych: Schizo affective schizophrenia ( Mario Ortega ) Endocrine: No heat/cold intolerance.  Allergic/Immunologic: No urticaria.  Resp: No cough, SOB.  Musculoskeletal: No joint swelling.      Physical Examination: BP 142/90 mmHg  Pulse 85  Temp(Src) 97.8 F (36.6 C) (Oral)  Resp 20  Ht 5\' 11"  (1.803 m)  Wt 68.153 kg (150 lb 4 oz)  BMI 20.96 kg/m2  SpO2 97% Gen: NAD, alert and oriented x 4 HEENT: PEERLA, EOMI, Neck: supple, no JVD or thyromegaly Chest: CTA bilaterally, no wheezes, crackles, or other adventitious sounds CV: RRR, no m/g/c/r Abd:  soft, NT, ND, +BS in all four quadrants; no HSM, guarding, ridigity, or rebound tenderness. Right lower quadrant ostomy noted. Green stool. No blood. Surgical scars noted.  Ext: no edema, well perfused with 2+ pulses, Skin: no rash or lesions noted  Data: Lab Results  Component Value Date   WBC 10.2 04/08/2015   HGB 11.2* 04/08/2015   HCT 34.5* 04/08/2015   MCV 99.2 04/08/2015   PLT 220 04/08/2015    Recent Labs Lab 04/07/15 1855 04/08/15 0439 04/08/15 1024  HGB 10.9* 10.7* 11.2*   Lab Results  Component Value Date   NA 151* 04/08/2015   K 4.2 04/08/2015   CL 118* 04/08/2015   CO2 28 04/08/2015   BUN 11 04/08/2015   CREATININE 1.28* 04/08/2015   Lab Results  Component Value Date   ALT 8* 04/08/2015   AST 11* 04/08/2015   ALKPHOS 43 04/08/2015   BILITOT 0.2* 04/08/2015   No results for input(s): APTT, INR, PTT in the last 168 hours.   CT of Abd & Pelvis: Impression: Right lower quadrant ostomy question ileostomy with suspect prior sub toal colectomy. Tiny non-obstructing left renal calculi. Question sludge versus noncalcified calculi in gallbladder.  Assessment/Plan: Mr. Mario Ortega is a 49 y.o. male with diagnosis of GI bleed. Source unknown. Maybe at anastomosis site of recent bowel perforation with subsequent colostomy. History of blood noted in his colostomy bag. Hgb stable. No further blood noted in colostomy bag.   Recommendations:  Agree with PPI. Monitor H/H. Colonoscopy recommended.  Potential complications such as but not limited to infection, bleeding, perforation or reaction to medication. Patient agreed to proceed.   Thank you for the consult. Please call with questions or concerns. Patient seen with Dr. Verdie Shire under collaborative agreement.  Faye Ramsay, FNP

## 2015-04-09 ENCOUNTER — Encounter
Admission: EM | Disposition: A | Discharge status: Other Acute Inpatient Hospital | Payer: Self-pay | Source: Home / Self Care | Attending: Internal Medicine

## 2015-04-09 LAB — BASIC METABOLIC PANEL
ANION GAP: 3 — AB (ref 5–15)
BUN: 11 mg/dL (ref 6–20)
CO2: 28 mmol/L (ref 22–32)
CREATININE: 1.32 mg/dL — AB (ref 0.61–1.24)
Calcium: 9.4 mg/dL (ref 8.9–10.3)
Chloride: 114 mmol/L — ABNORMAL HIGH (ref 101–111)
Glucose, Bld: 82 mg/dL (ref 65–99)
POTASSIUM: 3.9 mmol/L (ref 3.5–5.1)
Sodium: 145 mmol/L (ref 135–145)

## 2015-04-09 LAB — LAMOTRIGINE LEVEL: LAMOTRIGINE LVL: 5.7 ug/mL (ref 2.0–20.0)

## 2015-04-09 LAB — HEMOGLOBIN: HEMOGLOBIN: 10.7 g/dL — AB (ref 13.0–18.0)

## 2015-04-09 SURGERY — COLONOSCOPY WITH PROPOFOL
Anesthesia: General

## 2015-04-09 MED ORDER — MUPIROCIN 2 % EX OINT: TOPICAL_OINTMENT | CUTANEOUS | Status: DC

## 2015-04-09 MED ORDER — CHLORHEXIDINE GLUCONATE CLOTH 2 % EX PADS: MEDICATED_PAD | CUTANEOUS | Status: DC

## 2015-04-09 MED ORDER — VALPROIC ACID 250 MG PO CAPS: 750.0000 mg | ORAL_CAPSULE | Freq: Two times a day (BID) | ORAL | Status: DC

## 2015-04-09 NOTE — Discharge Summary (Addendum)
[facility] [place] NAME: Mario Ortega    MR#:  [id number]  DATE OF BIRTH:  1966-06-02  DATE OF ADMISSION:  04/07/2015 ADMITTING PHYSICIAN: Epifanio Lesches, MD  DATE OF DISCHARGE: 04/09/2015  PRIMARY CARE PHYSICIAN: Christie Nottingham., PA    ADMISSION DIAGNOSIS:  Gastrointestinal hemorrhage, unspecified gastritis, unspecified gastrointestinal hemorrhage type [K92.2]  DISCHARGE DIAGNOSIS:  Active Problems:   GI bleed   SECONDARY DIAGNOSIS:   Past Medical History  Diagnosis Date  . Colostomy in place [facility])   . Hypertension   . Schizo affective schizophrenia ([place])   . GERD (gastroesophageal reflux disease)   . Drug abuse     HOSPITAL COURSE:   1. Bleeding into colostomy. Patient seen in consultation by gastroenterology. They recommended doing a colonoscopy. Patient refused to drink the prep for the colonoscopy. Since hemoglobin remained stable they can set this up as outpatient if they choose to do so. 2. Hypernatremia- likely from nothing by mouth. Patient was on D5 drip. Restart regular diet. 3. MRSA nares- continue protocol 4. Schizophrenia-no changes in psych meds 5. Essential hypertension on Norvasc 6. Gastroesophageal reflux disease on PPI  DISCHARGE CONDITIONS:   stable  CONSULTS OBTAINED:     DRUG ALLERGIES:   Allergies  Allergen Reactions  . Penicillins Rash    DISCHARGE MEDICATIONS:   Current Discharge Medication List    START taking these medications   Details  mupirocin ointment (BACTROBAN) 2 % Until completed Qty: 22 g, Refills: 0      CONTINUE these medications which have CHANGED   Details  valproic acid (DEPAKENE) 250 MG capsule Take 3 capsules (750 mg total) by mouth 2 (two) times daily.      CONTINUE these medications which have NOT CHANGED   Details  amLODipine (NORVASC) 10 MG tablet Take 10 mg by mouth daily.    !! cloZAPine (CLOZARIL) 100 MG tablet Take 150 mg by mouth  every morning.    !! clozapine (CLOZARIL) 200 MG tablet Take 300 mg by mouth at bedtime.    glycopyrrolate (ROBINUL) 1 MG tablet Take 1 mg by mouth 3 (three) times daily.    lamoTRIgine (LAMICTAL) 100 MG tablet Take 50 mg by mouth 2 (two) times daily.    Multiple Vitamins-Minerals (MULTIVITAMIN WITH MINERALS) tablet Take 1 tablet by mouth daily.    omeprazole (PRILOSEC) 20 MG capsule Take 20 mg by mouth every morning.     !! - Potential duplicate medications found. Please discuss with provider.       DISCHARGE INSTRUCTIONS:   Follow up PMD one week  If you experience worsening of your admission symptoms, develop shortness of breath, life threatening emergency, suicidal or homicidal thoughts you must seek medical attention immediately by calling 911 or calling your MD immediately  if symptoms less severe.  You Must read complete instructions/literature along with all the possible adverse reactions/side effects for all the Medicines you take and that have been prescribed to you. Take any new Medicines after you have completely understood and accept all the possible adverse reactions/side effects.   Please note  You were cared for by a hospitalist during your hospital stay. If you have any questions about your discharge medications or the care you received while you were in the hospital after you are discharged, you can call the unit and asked to speak with the hospitalist on call if the hospitalist that took care of you is not available. Once you are discharged,  your primary care physician will handle any further medical issues. Please note that NO REFILLS for any discharge medications will be authorized once you are discharged, as it is imperative that you return to your primary care physician (or establish a relationship with a primary care physician if you do not have one) for your aftercare needs so that they can reassess your need for medications and monitor your lab  values.    Today   CHIEF COMPLAINT:   Chief Complaint  Patient presents with  . Rectal Bleeding    HISTORY OF PRESENT ILLNESS:  Mario Ortega  is a 49 y.o. male presented with bleeding into colostomy.   VITAL SIGNS:  Blood pressure 109/70, pulse 79, temperature 99.1 F (37.3 C), temperature source Oral, resp. rate 20, height 5\' 11"  (1.803 m), weight 68.153 kg (150 lb 4 oz), SpO2 97 %.    PHYSICAL EXAMINATION:  GENERAL:  50 y.o.-year-old patient lying in the bed with no acute distress.  EYES: Pupils equal, round, reactive to light and accommodation. No scleral icterus. Extraocular muscles intact.  HEENT: Head atraumatic, normocephalic. Oropharynx and nasopharynx clear.  NECK:  Supple, no jugular venous distention. No thyroid enlargement, no tenderness.  LUNGS: Normal breath sounds bilaterally, no wheezing, rales,rhonchi or crepitation. No use of accessory muscles of respiration.  CARDIOVASCULAR: S1, S2 normal. No murmurs, rubs, or gallops.  ABDOMEN: Soft, non-tender, non-distended. Bowel sounds present. No organomegaly or mass. No blood seen in colostomy on day of discharge EXTREMITIES: No pedal edema, cyanosis, or clubbing.  NEUROLOGIC: Cranial nerves II through XII are intact. Muscle strength 5/5 in all extremities. Sensation intact. Gait not checked.  PSYCHIATRIC: The patient is alert and oriented x 3.  SKIN: No obvious rash, lesion, or ulcer.   DATA REVIEW:   CBC  Recent Labs Lab 04/08/15 1024 04/09/15 0602  WBC 10.2  --   HGB 11.2* 10.7*  HCT 34.5*  --   PLT 220  --     Chemistries   Recent Labs Lab 04/08/15 0438 04/09/15 0602  NA 151* 145  K 4.2 3.9  CL 118* 114*  CO2 28 28  GLUCOSE 72 82  BUN 11 11  CREATININE 1.28* 1.32*  CALCIUM 9.6 9.4  AST 11*  --   ALT 8*  --   ALKPHOS 43  --   BILITOT 0.2*  --     Microbiology Results  Results for orders placed or performed during the hospital encounter of 04/07/15  MRSA PCR Screening     Status:  Abnormal   Collection Time: 04/07/15  7:50 PM  Result Value Ref Range Status   MRSA by PCR POSITIVE (A) NEGATIVE Final    Comment:        The GeneXpert MRSA Assay (FDA approved for NASAL specimens only), is one component of a comprehensive MRSA colonization surveillance program. It is not intended to diagnose MRSA infection nor to guide or monitor treatment for MRSA infections. CRITICAL RESULT CALLED TO, READ BACK BY AND VERIFIED WITH: Emeterio Reeve RN @ 2115 04/07/2015     RADIOLOGY:  No results found.  Management plans discussed with the patient, and he is in agreement.  CODE STATUS:     Code Status Orders        Start     Ordered   04/07/15 1307  Full code   Continuous     04/07/15 1308      TOTAL TIME TAKING CARE OF THIS PATIENT: 35 minutes.    Leslye Peer,  Mikaya Bunner M.D on 04/09/2015 at 12:37 PM  Between 7am to 6pm - Pager - [phone]  After 6pm go to [email or web address] - password [email or web address] [email or web address]  [facility]  Office  [phone]  CC: Primary care physician; Christie Nottingham., PA

## 2015-04-09 NOTE — Discharge Instructions (Signed)
Can follow up with Dr Candace Cruise if you want to schedule colonoscopy

## 2015-04-09 NOTE — Clinical Social Work Note (Signed)
Patient to discharge today to return to his family care home: [facility]. CSW contacted Ms. Nicole Kindred who is the owner and is listed on the facesheet and she will come to pick patient up today. Nurse is aware. Packet made and ready. Patient is aware and in agreement. Shela Leff MSW,LCSW [phone]

## 2015-04-09 NOTE — Progress Notes (Addendum)
Pt is alert and oriented, restricted affect, sleeping in between care, compliant with medications, remains on MRSA precautions, denies pain, small green output in colostomy bag, hgb remains stable, pt is d/c to group home, reviewed d/c instructions with Manuela Schwartz, caregiver at group home. No new prescriptions, patient will continue on ointment for MRSA in nares, tube used in hospital provided to caregiver. Pt is d/c via group home, morning medications given prior to discharge. Pt continues to have low grade fever/ Uneventful shift.

## 2015-04-09 NOTE — Plan of Care (Signed)
Problem: Discharge Progression Outcomes Goal: Other Discharge Outcomes/Goals Outcome: Progressing Pt is alert and oriented, no c/o pain at this time. No signs of GI bleed at this time. VSS, continue to assess.

## 2015-04-22 DIAGNOSIS — Z79899 Other long term (current) drug therapy: Secondary | ICD-10-CM | POA: Diagnosis not present

## 2015-04-29 DIAGNOSIS — Z79899 Other long term (current) drug therapy: Secondary | ICD-10-CM | POA: Diagnosis not present

## 2015-05-08 ENCOUNTER — Encounter: Payer: Self-pay | Admitting: *Deleted

## 2015-05-08 ENCOUNTER — Emergency Department
Admission: EM | Admit: 2015-05-08 | Discharge: 2015-05-12 | Disposition: A | Discharge status: Other Acute Inpatient Hospital | Payer: Medicaid Other | Attending: Emergency Medicine | Admitting: Emergency Medicine

## 2015-05-08 DIAGNOSIS — Z720 Tobacco use: Secondary | ICD-10-CM | POA: Diagnosis not present

## 2015-05-08 DIAGNOSIS — Z880 Allergy status to penicillin: Secondary | ICD-10-CM | POA: Insufficient documentation

## 2015-05-08 DIAGNOSIS — F250 Schizoaffective disorder, bipolar type: Secondary | ICD-10-CM | POA: Diagnosis not present

## 2015-05-08 DIAGNOSIS — I10 Essential (primary) hypertension: Secondary | ICD-10-CM

## 2015-05-08 DIAGNOSIS — Z79899 Other long term (current) drug therapy: Secondary | ICD-10-CM | POA: Diagnosis not present

## 2015-05-08 DIAGNOSIS — F259 Schizoaffective disorder, unspecified: Secondary | ICD-10-CM | POA: Diagnosis not present

## 2015-05-08 DIAGNOSIS — K219 Gastro-esophageal reflux disease without esophagitis: Secondary | ICD-10-CM

## 2015-05-08 DIAGNOSIS — F22 Delusional disorders: Secondary | ICD-10-CM | POA: Insufficient documentation

## 2015-05-08 DIAGNOSIS — R4182 Altered mental status, unspecified: Secondary | ICD-10-CM | POA: Diagnosis present

## 2015-05-08 HISTORY — DX: Carrier or suspected carrier of methicillin resistant Staphylococcus aureus: Z22.322

## 2015-05-08 LAB — URINE DRUG SCREEN, QUALITATIVE (ARMC ONLY)
AMPHETAMINES, UR SCREEN: NOT DETECTED
BARBITURATES, UR SCREEN: NOT DETECTED
BENZODIAZEPINE, UR SCRN: NOT DETECTED
Cannabinoid 50 Ng, Ur ~~LOC~~: NOT DETECTED
Cocaine Metabolite,Ur ~~LOC~~: NOT DETECTED
MDMA (Ecstasy)Ur Screen: NOT DETECTED
METHADONE SCREEN, URINE: NOT DETECTED
OPIATE, UR SCREEN: NOT DETECTED
Phencyclidine (PCP) Ur S: NOT DETECTED
TRICYCLIC, UR SCREEN: NOT DETECTED

## 2015-05-08 LAB — COMPREHENSIVE METABOLIC PANEL
ALBUMIN: 3.8 g/dL (ref 3.5–5.0)
ALK PHOS: 48 U/L (ref 38–126)
ALT: 19 U/L (ref 17–63)
ANION GAP: 5 (ref 5–15)
AST: 22 U/L (ref 15–41)
BUN: 16 mg/dL (ref 6–20)
CO2: 24 mmol/L (ref 22–32)
CREATININE: 1.31 mg/dL — AB (ref 0.61–1.24)
Calcium: 9.5 mg/dL (ref 8.9–10.3)
Chloride: 110 mmol/L (ref 101–111)
GFR calc Af Amer: >60 mL/min (ref >60)
GFR calc non Af Amer: >60 mL/min (ref >60)
GLUCOSE: 91 mg/dL (ref 65–99)
POTASSIUM: 4.3 mmol/L (ref 3.5–5.1)
SODIUM: 139 mmol/L (ref 135–145)
TOTAL PROTEIN: 6.8 g/dL (ref 6.5–8.1)
Total Bilirubin: 0.4 mg/dL (ref 0.3–1.2)

## 2015-05-08 LAB — CBC
HEMATOCRIT: 34.4 % — AB (ref 40.0–52.0)
Hemoglobin: 11.4 g/dL — ABNORMAL LOW (ref 13.0–18.0)
MCH: 33 pg (ref 26.0–34.0)
MCHC: 33.1 g/dL (ref 32.0–36.0)
MCV: 99.8 fL (ref 80.0–100.0)
Platelets: 224 10*3/uL (ref 150–440)
RBC: 3.45 MIL/uL — ABNORMAL LOW (ref 4.40–5.90)
RDW: 15.1 % — AB (ref 11.5–14.5)
WBC: 7.8 10*3/uL (ref 3.8–10.6)

## 2015-05-08 LAB — ACETAMINOPHEN LEVEL

## 2015-05-08 LAB — ETHANOL

## 2015-05-08 LAB — SALICYLATE LEVEL
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
## 2015-05-08 NOTE — BH Assessment (Addendum)
Tele Assessment Note   Mario Ortega is an 49 y.o. male, single, African-American who presents unaccompanied to [facility] ED from [place]. Pt has a history of schizophrenia and states he is hearing voices telling him to kill people. Pt states he has auditory hallucinations and has heard voices for years but recently they have increased in intensity. Pt has tangential thought process and is religiously preoccupied. He states he is "dealing with the devil." His thought process is tangential and he quotes Biblical verse when asked questions. He states he is having "flashbacks." He says he disrespected at the care home, "they are trying to have a boy do a man's job", and that two residents were trying to kill him but he would kill them first. He also told ED staff he was concerned that he didn't get to see him mother because he didn't have any teeth and that he wasn't a child and he would hurt Manuela Schwartz, (staff member) if she tried to keep him from his mother. Pt reports he has a history of abusing cocaine in the past but cannot say how long ago he last used. Pt reported to EDP that he killed his grandfather and per [place] offender search Pt was convicted of second degree murder in 91.  Pt became increasingly frustrated and agitated as assessment progressed. He states, "You are trying to play mind games with me and I'm not going to let you." He then refused to answer any additional question.  Pt is dressed in hospital scrubs, alert and oriented to person, place and situation but not time. He has no teeth and speech is mumbled and unclear at times. Motor behavior appears normal. Unable to assess ADLs. Eye contact is poor and Pt kept eyes closed during assessment. Pt's mood is angry and defensive and affect agitated. Thought process is tangential with grandiose, paranoid and religious content.  Pt appears to be responding to internal stimuli. Pt would not indicate whether he would be willing to  participate in inpatient psychiatric treatment.   Diagnosis: Schizophrenia  Past Medical History:  Past Medical History  Diagnosis Date  . Colostomy in place [facility])   . Hypertension   . Schizo affective schizophrenia ([place])   . GERD (gastroesophageal reflux disease)   . Drug abuse   . MRSA (methicillin resistant staph aureus) culture positive     History reviewed. No pertinent past surgical history.  Family History: No family history on file.  Social History:  reports that he has been smoking.  He does not have any smokeless tobacco history on file. He reports that he does not drink alcohol or use illicit drugs.  Additional Social History:  Alcohol / Drug Use Pain Medications: Unknown Prescriptions: See MAR Over the Counter: See MAR History of alcohol / drug use?: Yes (Pt reports a history of abusing cocaine in the past) Longest period of sobriety (when/how long): Unknown  CIWA: CIWA-Ar BP: (!) 162/99 mmHg Pulse Rate: 92 COWS:    PATIENT STRENGTHS: (choose at least two) Average or above average intelligence General fund of knowledge  Allergies:  Allergies  Allergen Reactions  . Penicillins Rash    Home Medications:  (Not in a hospital admission)  OB/GYN Status:  No LMP for male patient.  General Assessment Data Location of Assessment: [facility] ED TTS Assessment: In system Is this a Tele or Face-to-Face Assessment?: Tele Assessment Is this an Initial Assessment or a Re-assessment for this encounter?: Initial Assessment Marital status: Single [place]  name: NA Is patient pregnant?: No Pregnancy Status: No Living Arrangements: Other (Comment) (Care Home) Can pt return to current living arrangement?: Yes Admission Status: Voluntary Is patient capable of signing voluntary admission?: Yes Referral Source: Other (Care home staff) Insurance type: Medicaid     Crisis Care Plan Living Arrangements: Other (Comment) (Care Home) Name of Psychiatrist: Unknown Name of  Therapist: Unknown  Education Status Is patient currently in school?: No Current Grade: NA Highest grade of school patient has completed: NA Name of school: NA Contact person: NA  Risk to self with the past 6 months Suicidal Ideation: No Has patient been a risk to self within the past 6 months prior to admission? : No Suicidal Intent: No Has patient had any suicidal intent within the past 6 months prior to admission? : No Is patient at risk for suicide?: No Suicidal Plan?: No Has patient had any suicidal plan within the past 6 months prior to admission? : No Access to Means: No What has been your use of drugs/alcohol within the last 12 months?: Pt reports a history of using cocaine in the past Previous Attempts/Gestures: No How many times?: 0 Other Self Harm Risks: None identified Triggers for Past Attempts: None known Intentional Self Injurious Behavior: None Family Suicide History: Unknown Recent stressful life event(s): Other (Comment) (Pt feels disrespected by staff at care home) Persecutory voices/beliefs?: Yes Depression: Yes Depression Symptoms: Feeling angry/irritable Substance abuse history and/or treatment for substance abuse?: Yes Suicide prevention information given to non-admitted patients: Not applicable  Risk to Others within the past 6 months Homicidal Ideation: No Does patient have any lifetime risk of violence toward others beyond the six months prior to admission? : Unknown Thoughts of Harm to Others: Yes-Currently Present Comment - Thoughts of Harm to Others: Thoughts of harm residents and staff at care home Current Homicidal Intent: No Current Homicidal Plan: No Access to Homicidal Means: No Identified Victim: Residents and staff at care home History of harm to others?: No (Pt denies history of violence) Assessment of Violence: None Noted Violent Behavior Description: Pt denies history of violence Does patient have access to weapons?: No Criminal  Charges Pending?: No Does patient have a court date: No Is patient on probation?: No  Psychosis Hallucinations: Auditory, With command (Command auditory hallucinations to kill people) Delusions: Grandiose, Persecutory (Religious preoccupation)  Mental Status Report Appearance/Hygiene: In scrubs Eye Contact: Poor Motor Activity: Unremarkable Speech: Tangential Level of Consciousness: Alert Mood: Irritable, Angry Affect: Irritable Anxiety Level: Minimal Thought Processes: Tangential Judgement: Impaired Orientation: Person, Place Obsessive Compulsive Thoughts/Behaviors: Moderate  Cognitive Functioning Concentration: Decreased Memory: Unable to Assess IQ: Average Insight: Poor Impulse Control: Fair Appetite: Fair Weight Loss: 0 Weight Gain: 0 Sleep: No Change Total Hours of Sleep: 8 Vegetative Symptoms: Unable to Assess  ADLScreening [facility] Assessment Services) Patient's cognitive ability adequate to safely complete daily activities?: Yes Patient able to express need for assistance with ADLs?: Yes Independently performs ADLs?:  (unknown)  Prior Inpatient Therapy Prior Inpatient Therapy: Yes Prior Therapy Dates: unknown Prior Therapy Facilty/Provider(s): unknown Reason for Treatment: Schizophrenia  Prior Outpatient Therapy Prior Outpatient Therapy: Yes Prior Therapy Dates: Current Prior Therapy Facilty/Provider(s): Unknown Reason for Treatment: Schizophrenia Does patient have an ACCT team?: No Does patient have Intensive In-House Services?  : No Does patient have [facility] services? : No Does patient have P4CC services?: No  ADL Screening (condition at time of admission) Patient's cognitive ability adequate to safely complete daily activities?: Yes Patient able to express need for  assistance with ADLs?: Yes Independently performs ADLs?:  (unknown)       Abuse/Neglect Assessment (Assessment to be complete while patient is alone) Physical Abuse: Denies Verbal  Abuse: Denies Sexual Abuse: Denies Exploitation of patient/patient's resources: Denies Self-Neglect: Denies     Regulatory affairs officer (For Healthcare) Does patient have an advance directive?: No Would patient like information on creating an advanced directive?: No - patient declined information    Additional Information 1:1 In Past 12 Months?: No CIRT Risk: No Elopement Risk: No Does patient have medical clearance?: Yes     Disposition: Melissa, RN at [facility], confirms psychiatric unit is at capacity. Lavell Luster, [facility] at [facility], confirms [address] is currently at capacity. Attempted to contact [facility] psychiatry on-call Dr. Idalia Needle at [phone] and left voicemail requesting callback. After no response, spoke to Socorro, Therapist, sports at [facility] who instructed to call Dr. Jimmye Norman at [phone]-- left TTS number on pager.  05/09/15 2353 - No response from [facility] psychiatry on-call. Discussed case with EDP, Nena Polio, MD, who agreed that Pt be observed in ED and evaluated by psychiatry in the morning.  Disposition Initial Assessment Completed for this Encounter: Yes Disposition of Patient: Other dispositions Other disposition(s): Other (Comment)   Evelena Peat, [facility], [facility], [facility] Triage Specialist [phone]   Anson Fret, Orpah Greek 05/08/2015 11:26 PM

## 2015-05-09 ENCOUNTER — Encounter: Payer: Self-pay | Admitting: Emergency Medicine

## 2015-05-09 DIAGNOSIS — I10 Essential (primary) hypertension: Secondary | ICD-10-CM

## 2015-05-09 DIAGNOSIS — F259 Schizoaffective disorder, unspecified: Secondary | ICD-10-CM | POA: Diagnosis not present

## 2015-05-09 DIAGNOSIS — K219 Gastro-esophageal reflux disease without esophagitis: Secondary | ICD-10-CM

## 2015-05-09 MED ORDER — ZIPRASIDONE MESYLATE 20 MG IM SOLR
20.0000 mg | Freq: Once | INTRAMUSCULAR | Status: AC
  Administered 2015-05-09: 20 mg via INTRAMUSCULAR

## 2015-05-09 MED ORDER — MORPHINE SULFATE (PF) 2 MG/ML IV SOLN
INTRAVENOUS | Status: AC
  Filled 2015-05-09: qty 1

## 2015-05-09 MED ORDER — PANTOPRAZOLE SODIUM 20 MG PO TBEC
20.0000 mg | DELAYED_RELEASE_TABLET | Freq: Every day | ORAL | Status: DC
  Filled 2015-05-09: qty 1

## 2015-05-09 MED ORDER — ZIPRASIDONE MESYLATE 20 MG IM SOLR
INTRAMUSCULAR | Status: AC
  Administered 2015-05-09: 20 mg via INTRAMUSCULAR
  Filled 2015-05-09: qty 20

## 2015-05-09 MED ORDER — LAMOTRIGINE 25 MG PO TABS
50.0000 mg | ORAL_TABLET | Freq: Every day | ORAL | Status: DC
  Administered 2015-05-09 – 2015-05-12 (×3): 50 mg via ORAL
  Filled 2015-05-09 (×4): qty 2

## 2015-05-09 MED ORDER — VALPROIC ACID 250 MG PO CAPS
750.0000 mg | ORAL_CAPSULE | Freq: Two times a day (BID) | ORAL | Status: DC
  Administered 2015-05-09 – 2015-05-12 (×7): 750 mg via ORAL
  Filled 2015-05-09 (×10): qty 3

## 2015-05-09 MED ORDER — CLOZAPINE 100 MG PO TABS
400.0000 mg | ORAL_TABLET | Freq: Every day | ORAL | Status: DC
  Administered 2015-05-10 – 2015-05-12 (×4): 400 mg via ORAL
  Filled 2015-05-09 (×4): qty 4

## 2015-05-09 MED ORDER — GLYCOPYRROLATE 1 MG PO TABS
1.0000 mg | ORAL_TABLET | Freq: Three times a day (TID) | ORAL | Status: DC
  Administered 2015-05-09 – 2015-05-12 (×9): 1 mg via ORAL
  Filled 2015-05-09 (×10): qty 1

## 2015-05-09 MED ORDER — CLOZAPINE 25 MG PO TABS
50.0000 mg | ORAL_TABLET | Freq: Every morning | ORAL | Status: DC
  Administered 2015-05-09: 50 mg via ORAL
  Filled 2015-05-09 (×2): qty 2

## 2015-05-09 MED ORDER — PANTOPRAZOLE SODIUM 40 MG PO TBEC
40.0000 mg | DELAYED_RELEASE_TABLET | Freq: Every day | ORAL | Status: DC
  Administered 2015-05-09: 40 mg via ORAL
  Filled 2015-05-09: qty 1

## 2015-05-09 MED ORDER — AMLODIPINE BESYLATE 5 MG PO TABS
10.0000 mg | ORAL_TABLET | Freq: Every day | ORAL | Status: DC
  Administered 2015-05-09 – 2015-05-12 (×3): 10 mg via ORAL
  Filled 2015-05-09 (×4): qty 2

## 2015-05-09 MED ORDER — ONDANSETRON HCL 4 MG/2ML IJ SOLN
INTRAMUSCULAR | Status: AC
  Filled 2015-05-09: qty 2

## 2015-05-09 NOTE — ED Notes (Signed)
Pt in room with MD. Yelling heard. Door opened and MD states they are currently under control. Pt heard crying stating homicidal ideation. Will continue to monitor. Education officer, environmental present.

## 2015-05-09 NOTE — ED Notes (Signed)
BEHAVIORAL HEALTH ROUNDING  Patient sleeping: No.  Patient alert and oriented: yes  Behavior appropriate: Yes. ; If no, describe:  Nutrition and fluids offered: Yes  Toileting and hygiene offered: Yes  Sitter present: not applicable  Law enforcement present: Yes [facility]  

## 2015-05-09 NOTE — ED Notes (Signed)
Report received from RN Kerry  Pt in room. No complaints or concerns voiced at this time. No abnormal behavior noted at this time. Will continue to monitor with q15 min checks. [employer] officer in area.

## 2015-05-09 NOTE — ED Notes (Signed)
Pt given sandwich tray due to not liking breakfast tray options. Pt given peanut butter crackers and milk per request as well. Placed into room per request to watch TV. Denies complaints at this time. Will continue to monitor for behavioral escalation. Calm and cooperative at this time.

## 2015-05-09 NOTE — ED Notes (Signed)
Pharm called for med 

## 2015-05-09 NOTE — ED Notes (Signed)
BEHAVIORAL HEALTH ROUNDING Patient sleeping: Yes.   Patient alert and oriented: not applicable Behavior appropriate: Yes.    Nutrition and fluids offered: No Toileting and hygiene offered: No Sitter present: q15 minute observations Law enforcement present: Yes [employer] 

## 2015-05-09 NOTE — ED Notes (Signed)
BEHAVIORAL HEALTH ROUNDING  Patient sleeping: No.  Patient alert and oriented: yes  Behavior appropriate: Yes. ; If no, describe:  Nutrition and fluids offered: Yes  Toileting and hygiene offered: Yes  Sitter present: not applicable  Law enforcement present: Yes [facility] ED [place]  Is the patient under IVC or is there intent for IVC: Yes.  Is the patient medically cleared: Yes.  Is there vacancy in the ED BHU: Yes.  Is the population mix appropriate for patient: Yes.  Is the patient awaiting placement in inpatient or outpatient setting: Yes.  Has the patient had a psychiatric consult: Yes.  Survey of unit performed for contraband, proper placement and condition of furniture, tampering with fixtures in bathroom, shower, and each patient room: Yes. ; Findings: All clear  APPEARANCE/BEHAVIOR  calm, cooperative and adequate rapport can be established  NEURO ASSESSMENT  Orientation: time, place and person  Hallucinations: No.None noted (Hallucinations)  Speech: Normal  Gait: normal  RESPIRATORY ASSESSMENT  WNL  CARDIOVASCULAR ASSESSMENT  WNL  GASTROINTESTINAL ASSESSMENT  WNL Pt has colostomy bag.  EXTREMITIES  WNL  PLAN OF CARE  Provide calm/safe environment. Vital signs assessed twice daily. ED BHU Assessment once each 12-hour shift. Collaborate with intake RN daily or as condition indicates. Assure the ED provider has rounded once each shift. Provide and encourage hygiene. Provide redirection as needed. Assess for escalating behavior; address immediately and inform ED provider.  Assess family dynamic and appropriateness for visitation as needed: Yes. ; If necessary, describe findings:  Educate the patient/family about BHU procedures/visitation: Yes. ; If necessary, describe findings: Pt is calm and cooperative at this time. Pt understanding and accepting of unit procedures/rules. Will continue to monitor.

## 2015-05-09 NOTE — ED Notes (Signed)
BEHAVIORAL HEALTH ROUNDING Patient sleeping: yes Patient alert and oriented: yes Behavior appropriate: Yes.  ; If no, describe: y Nutrition and fluids offered: Yes  Toileting and hygiene offered: no Sitter present: no, pt not on suicide watch.  q 15 min checks only Law enforcement present: Yes

## 2015-05-09 NOTE — ED Notes (Signed)
BEHAVIORAL HEALTH ROUNDING Patient sleeping: Yes.   Patient alert and oriented: yes Behavior appropriate: Yes.  ;  Nutrition and fluids offered: No Toileting and hygiene offered: No Sitter present: pt not on suicide watch,  No sitter needed.   q 15 minute checks only Law enforcement present: Yes

## 2015-05-09 NOTE — ED Notes (Signed)
BEHAVIORAL HEALTH ROUNDING Patient sleeping: No. Patient alert and oriented: yes Behavior appropriate: Yes.  ; If no, describe: y Nutrition and fluids offered: Yes  Toileting and hygiene offered: Yes  Sitter present: no, pt not on suicide watch.  q 15 min checks only Law enforcement present: Yes

## 2015-05-09 NOTE — ED Provider Notes (Signed)
-----------------------------------------   10:55 PM on 05/09/2015 -----------------------------------------  Patient was calm and comfortable all day, however we will come to give his medications he began to scream and violently threaten [place]. He states "get the fuck out of my room you pale faced mother fucker"  given his significant agitation and violent statements towards staff, we will give him Geodon to help relieve some of his anxiety. QTC is 408.  Schuyler Amor, MD 05/09/15 2256

## 2015-05-09 NOTE — ED Notes (Signed)
Pt agitated due to breakfast tray not being brought by cafeteria this am. Cafeteria called and breakfast tray ordered. Pt states " yall will not treat me this way. Bring me my things. Im ready to go." Instructed tray has been ordered and assisted to shower by [employer]. Called cafeteria for tray once again, and notified tray has been sent. Pt calm and cooperative at this time. Mildly agitated.

## 2015-05-09 NOTE — ED Notes (Signed)

## 2015-05-09 NOTE — Consult Note (Addendum)
[facility] Consult   Reason for Consult:  Altered Mental Status Referring Physician:  Nena Polio, MD  Patient Identification: Mario Ortega MRN:  [id number] Principal Diagnosis: Schizoaffective disorder [facility])  Diagnosis:   Patient Active Problem List   Diagnosis Date Noted  . Schizoaffective disorder ([place]) [F25.9] 05/09/2015  . HTN (hypertension) [I10] 05/09/2015  . GERD (gastroesophageal reflux disease) [K21.9] 05/09/2015  . GI bleed [K92.2] 04/07/2015   Total Time spent with patient: 45 minutes  Subjective:   Mario Ortega is a 49 y.o. male patient who presents to [facility] ED with altered mental status. He notes he is experiencing command AH telling him to kill people in his group home. He shared he previously killed his grandfather.  He is afraid he would harm himself or someone else and therefore he came to the hospital for help.   Nursing notes reviewed. The pt shared he is having flashbacks and hearing voices.  He expressed flashbacks are of the evil and it is causing him to be violent.  Per his group home, [facility], Mario Ortega was agitated and stating two residents there were trying to kill him but he would kill them first. He is also upset that he was not able to see his mother. Pt has colostomy bag.   Today on interview, Mario Ortega is tangential and circumstantial. His mood is agitated and irritable. His affect is labile. He begins the interview by greeting this provider then, unprovoked, he began to discuss the murder of his grandfather (who he calls his father). The pt relived that story and discussed that he wants to kill the person who killed his father. The pt then becomes upset, starts crying, shows this provider his colostomy bag, starts to cry, yell and scream and subsequently stated that he does not know who killed his grandfather. He states he wants to find the man who killed his grandfather and kill him as the pt spent 7.5  years in prison for "A crime I did not commit."   The pt is uncertain of his current medications. He states we will have to check his group home. He does not recall when he took his last medications but shared he has been medication compliant. Pt endorses command AH stating they are telling him to kill his family. He also shares he has SI with a plan to kill himself via hanging. He expressed he also has VH of dots.  Past Psychiatric History: Dx: Schizoaffective disorder, bipolar type Polysubstance abuse Psychiatrist: Unknown, possibly Dr. Lacinda Axon Therapist: Denies Hospitalizations: Unknown ECT: Denies  Suicide attempt/Self-harm: Denies  Homicide attempts/harming others: Yes, murdered his grandfather in 1986/87 Previous Medication Trials: Unknown   Risk to Self: Suicidal Ideation: No Suicidal Intent: No Is patient at risk for suicide?: No Suicidal Plan?: No Access to Means: No What has been your use of drugs/alcohol within the last 12 months?: Pt reports a history of using cocaine in the past How many times?: 0 Other Self Harm Risks: None identified Triggers for Past Attempts: None known Intentional Self Injurious Behavior: None Risk to Others: Homicidal Ideation: No Thoughts of Harm to Others: Yes-Currently Present Comment - Thoughts of Harm to Others: Thoughts of harm residents and staff at care home Current Homicidal Intent: No Current Homicidal Plan: No Access to Homicidal Means: No Identified Victim: Residents and staff at care home History of harm to others?: No (Pt denies history of violence) Assessment of Violence: None Noted Violent Behavior Description:  Pt denies history of violence Does patient have access to weapons?: No Criminal Charges Pending?: No Does patient have a court date: No Prior Inpatient Therapy: Prior Inpatient Therapy: Yes Prior Therapy Dates: unknown Prior Therapy Facilty/Provider(s): unknown Reason for Treatment: Schizophrenia Prior Outpatient  Therapy: Prior Outpatient Therapy: Yes Prior Therapy Dates: Current Prior Therapy Facilty/Provider(s): Unknown Reason for Treatment: Schizophrenia Does patient have an ACCT team?: No Does patient have Intensive In-House Services?  : No Does patient have [facility] services? : No Does patient have P4CC services?: No  Past Medical History:  Past Medical History  Diagnosis Date  . Colostomy in place [facility])   . Hypertension   . Schizo affective schizophrenia ([place])   . GERD (gastroesophageal reflux disease)   . Drug abuse   . MRSA (methicillin resistant staph aureus) culture positive    History reviewed. No pertinent past surgical history. Family History: History reviewed. No pertinent family history.  Family Psychiatric  History:  Maternal: Unknown Paternal: Unknown Suicide: Unknown  Social History:  History  Alcohol Use No     History  Drug Use No    Social History   Social History  . Marital Status: Single    Spouse Name: N/A  . Number of Children: N/A  . Years of Education: N/A   Social History Main Topics  . Smoking status: Current Every Day Smoker  . Smokeless tobacco: None  . Alcohol Use: No  . Drug Use: No  . Sexual Activity: Not Asked   Other Topics Concern  . None   Social History Narrative   Additional Social History:    Pain Medications: Unknown Prescriptions: See MAR Over the Counter: See MAR History of alcohol / drug use?: Yes (Pt reports a history of abusing cocaine in the past) Longest period of sobriety (when/how long): Unknown  Lives in a group home,  [facility] [address] [place], [place] [address] Phone  Allergies:   Allergies  Allergen Reactions  . Penicillins Rash    Labs:  Results for orders placed or performed during the hospital encounter of 05/08/15 (from the past 48 hour(s))  Comprehensive metabolic panel     Status: Abnormal   Collection Time: 05/08/15  8:51 PM  Result Value Ref  Range   Sodium 139 135 - 145 mmol/L   Potassium 4.3 3.5 - 5.1 mmol/L   Chloride 110 101 - 111 mmol/L   CO2 24 22 - 32 mmol/L   Glucose, Bld 91 65 - 99 mg/dL   BUN 16 6 - 20 mg/dL   Creatinine, Ser 1.31 (H) 0.61 - 1.24 mg/dL   Calcium 9.5 8.9 - 10.3 mg/dL   Total Protein 6.8 6.5 - 8.1 g/dL   Albumin 3.8 3.5 - 5.0 g/dL   AST 22 15 - 41 U/L   ALT 19 17 - 63 U/L   Alkaline Phosphatase 48 38 - 126 U/L   Total Bilirubin 0.4 0.3 - 1.2 mg/dL   GFR calc non Af Amer >60 >60 mL/min   GFR calc Af Amer >60 >60 mL/min    Comment: (NOTE) The eGFR has been calculated using the CKD EPI equation. This calculation has not been validated in all clinical situations. eGFR's persistently <60 mL/min signify possible Chronic Kidney Disease.    Anion gap 5 5 - 15  Ethanol (ETOH)     Status: None   Collection Time: 05/08/15  8:51 PM  Result Value Ref Range   Alcohol, Ethyl (B) <  5 <5 mg/dL    Comment:        LOWEST DETECTABLE LIMIT FOR SERUM ALCOHOL IS 5 mg/dL FOR MEDICAL PURPOSES ONLY   Salicylate level     Status: None   Collection Time: 05/08/15  8:51 PM  Result Value Ref Range   Salicylate Lvl <8.0 2.8 - 30.0 mg/dL  Acetaminophen level     Status: Abnormal   Collection Time: 05/08/15  8:51 PM  Result Value Ref Range   Acetaminophen (Tylenol), Serum <10 (L) 10 - 30 ug/mL    Comment:        THERAPEUTIC CONCENTRATIONS VARY SIGNIFICANTLY. A RANGE OF 10-30 ug/mL MAY BE AN EFFECTIVE CONCENTRATION FOR MANY PATIENTS. HOWEVER, SOME ARE BEST TREATED AT CONCENTRATIONS OUTSIDE THIS RANGE. ACETAMINOPHEN CONCENTRATIONS >150 ug/mL AT 4 HOURS AFTER INGESTION AND >50 ug/mL AT 12 HOURS AFTER INGESTION ARE OFTEN ASSOCIATED WITH TOXIC REACTIONS.   CBC     Status: Abnormal   Collection Time: 05/08/15  8:51 PM  Result Value Ref Range   WBC 7.8 3.8 - 10.6 K/uL   RBC 3.45 (L) 4.40 - 5.90 MIL/uL   Hemoglobin 11.4 (L) 13.0 - 18.0 g/dL   HCT 34.4 (L) 40.0 - 52.0 %   MCV 99.8 80.0 - 100.0 fL   MCH 33.0  26.0 - 34.0 pg   MCHC 33.1 32.0 - 36.0 g/dL   RDW 15.1 (H) 11.5 - 14.5 %   Platelets 224 150 - 440 K/uL  Urine Drug Screen, Qualitative ([facility] only)     Status: None   Collection Time: 05/08/15  8:51 PM  Result Value Ref Range   Tricyclic, Ur Screen NONE DETECTED NONE DETECTED   Amphetamines, Ur Screen NONE DETECTED NONE DETECTED   MDMA (Ecstasy)Ur Screen NONE DETECTED NONE DETECTED   Cocaine Metabolite,Ur [place] NONE DETECTED NONE DETECTED   Opiate, Ur Screen NONE DETECTED NONE DETECTED   Phencyclidine (PCP) Ur S NONE DETECTED NONE DETECTED   Cannabinoid 50 Ng, Ur [place] NONE DETECTED NONE DETECTED   Barbiturates, Ur Screen NONE DETECTED NONE DETECTED   Benzodiazepine, Ur Scrn NONE DETECTED NONE DETECTED   Methadone Scn, Ur NONE DETECTED NONE DETECTED    Comment: (NOTE) 034  Tricyclics, urine               Cutoff 1000 ng/mL 200  Amphetamines, urine             Cutoff 1000 ng/mL 300  MDMA (Ecstasy), urine           Cutoff 500 ng/mL 400  Cocaine Metabolite, urine       Cutoff 300 ng/mL 500  Opiate, urine                   Cutoff 300 ng/mL 600  Phencyclidine (PCP), urine      Cutoff 25 ng/mL 700  Cannabinoid, urine              Cutoff 50 ng/mL 800  Barbiturates, urine             Cutoff 200 ng/mL 900  Benzodiazepine, urine           Cutoff 200 ng/mL 1000 Methadone, urine                Cutoff 300 ng/mL 1100 1200 The urine drug screen provides only a preliminary, unconfirmed 1300 analytical test result and should not be used for non-medical 1400 purposes. Clinical consideration and professional judgment should 1500 be applied to any positive drug  screen result due to possible 1600 interfering substances. A more specific alternate chemical method 1700 must be used in order to obtain a confirmed analytical result.  1800 Gas chromato graphy / mass spectrometry (GC/MS) is the preferred 1900 confirmatory method.     No current facility-administered medications for this encounter.   Current  Outpatient Prescriptions  Medication Sig Dispense Refill  . amLODipine (NORVASC) 10 MG tablet Take 10 mg by mouth daily.    . cloZAPine (CLOZARIL) 100 MG tablet Take 150 mg by mouth every morning.    . clozapine (CLOZARIL) 200 MG tablet Take 300 mg by mouth at bedtime.    Marland Kitchen glycopyrrolate (ROBINUL) 1 MG tablet Take 1 mg by mouth 3 (three) times daily.    Marland Kitchen lamoTRIgine (LAMICTAL) 100 MG tablet Take 50 mg by mouth 2 (two) times daily.    . Multiple Vitamins-Minerals (MULTIVITAMIN WITH MINERALS) tablet Take 1 tablet by mouth daily.    . mupirocin ointment (BACTROBAN) 2 % Until completed 22 g 0  . omeprazole (PRILOSEC) 20 MG capsule Take 20 mg by mouth every morning.    . valproic acid (DEPAKENE) 250 MG capsule Take 3 capsules (750 mg total) by mouth 2 (two) times daily.      Musculoskeletal: Strength & Muscle Tone: within normal limits Gait & Station: not assessed Patient leans: N/A  Psychiatric Specialty Exam: Review of Systems  Constitutional: Negative.   HENT: Negative.   Eyes: Negative.   Respiratory: Negative.   Cardiovascular: Negative.   Gastrointestinal: Negative.   Genitourinary: Negative.   Musculoskeletal: Negative.   Skin: Negative.   Neurological: Negative.   Endo/Heme/Allergies: Negative.   Psychiatric/Behavioral: Positive for depression, suicidal ideas and hallucinations. Negative for memory loss and substance abuse. The patient is not nervous/anxious and does not have insomnia.     Blood pressure 162/99, pulse 92, temperature 99.4 F (37.4 C), temperature source Oral, resp. rate 20, height $RemoveBe'5\' 11"'HGlHsibwP$  (1.803 m), weight 71.668 kg (158 lb), SpO2 100 %.Body mass index is 22.05 kg/(m^2).  General Appearance: Disheveled and edentulous  Eye Contact::  Fair  Speech:  Garbled and variable rate  Volume:  Increased  Mood:  Irritable and agitated  Affect:  Labile  Thought Process:  Circumstantial, Disorganized and Tangential  Orientation:  Full (Time, Place, and Person)   Thought Content:  Hallucinations: Auditory Command:  yes, please see HPI. Visual  Suicidal Thoughts:  Yes.  with intent/plan  Homicidal Thoughts:  Yes.  without intent/plan  Memory:  intact to conversation  Judgement:  Poor  Insight:  Lacking  Psychomotor Activity:  Increased and Tremor  Concentration:  Poor  Recall:  intact to conversation  [employer] of Knowledge:Fair  Language: Fair  Akathisia:  No  Handed:  Not asked  AIMS (if indicated):     Assets:  Financial Resources/Insurance Housing  ADL's:  Intact  Cognition: WNL  Sleep:      Treatment Plan Summary: Daily contact with patient to assess and evaluate symptoms and progress in treatment and Medication management   1. Restart home medications including the following:  - Clozaril $RemoveBe'50mg'kubtnaEol$  po Qam and $Remov'400mg'UAgLMI$  po QHS for schizoaffective disorder  - Lamictal $RemoveBe'50mg'BAtoXHiSR$  po Qam (uncertain why pt is taking this home medication)  - Depakote $RemoveBe'750mg'NzMjppmVZ$  po BID for mood stabilization  - Glycopyrrolate $RemoveBeforeDE'1mg'tvFizoBTcDvrKFb$  po TID   - Multivitamin 1 po QD  - Norvasc $RemoveB'10mg'SXzOBTkd$  po Qam.   - Prilosec $RemoveBe'20mg'qjeNLlbcm$  po Qam (pantoprazole $RemoveBeforeDEI'40mg'ObLkthZfYeDeYBNF$  po Qam was initially ordered and the pt may have  received '40mg'$  on 05/09/2015. Dosing was decreased to $RemoveBefo'20mg'eSUAWEOUQqE$  on 05/09/2015 11:50 AM as this was his home dosage).     - Please note that Prilosec and pantoprazole can cause a decrease in antipsychotic levels.   - Also note that Norvasc can increase the level of Clozaril in the pt's system.   2. It may be of benefit to check the following studies/labs:  - valproic acid level prior to morning dosage  - will order Clozaril level   - will order EKG to determine QTc prolongation.   3. Labs from 04-16-2015 were reviewed which included VPA level of 73; Vitamin D of 41.6; Hemoglobin A1c: 4.9%; Albumin 3.8, Creatinine: 1.55m, serum glucose of 105; serum calcium 10.4 and GFR: 1.73. CBC with diff was unremarkable.   4. Will order EKG as pt is on Clozaril.  3. Continue IVC at this time.   Disposition: Recommend  psychiatric Inpatient admission when medically cleared.  Donita Brooks 05/09/2015 7:11 AM

## 2015-05-09 NOTE — ED Notes (Signed)
BEHAVIORAL HEALTH ROUNDING Patient sleeping: Yes.   Patient alert and oriented: not applicable Behavior appropriate: Yes.    Nutrition and fluids offered: No Toileting and hygiene offered: No Sitter present: q15 minute observations Law enforcement present: Yes [employer]  ENVIRONMENTAL ASSESSMENT Potentially harmful objects out of patient reach: Yes.   Personal belongings secured: Yes.   Patient dressed in hospital provided attire only: Yes.   Plastic bags out of patient reach: Yes.   Patient care equipment (cords, cables, call bells, lines, and drains) shortened, removed, or accounted for: Yes.   Equipment and supplies removed from bottom of stretcher: Yes.   Potentially toxic materials out of patient reach: Yes.   Sharps container removed or out of patient reach: Yes.

## 2015-05-09 NOTE — ED Notes (Signed)
Pt on stretcher in [place]. No complaints or concerns voiced at this time. No abnormal behavior noted at this time. Will continue to monitor with q15 min checks. ODS officer in area.

## 2015-05-09 NOTE — ED Provider Notes (Signed)
-----------------------------------------   7:05 AM on 05/09/2015 -----------------------------------------   Blood pressure 162/99, pulse 92, temperature 99.4 F (37.4 C), temperature source Oral, resp. rate 20, height 5\' 11"  (1.803 m), weight 158 lb (71.668 kg), SpO2 100 %.  The patient had no acute events since last update.  Calm and cooperative at this time.  Disposition is pending per Psychiatry/Behavioral Medicine team recommendations.     Delman Kitten, MD 05/09/15 [phone]

## 2015-05-09 NOTE — ED Notes (Signed)
Patient ambulatory to bathroom, emptied his colostomy bag. Pt denies complaints, remains cooperative.

## 2015-05-09 NOTE — ED Notes (Signed)
BEHAVIORAL HEALTH ROUNDING Patient sleeping: No. Patient alert and oriented: yes Behavior appropriate: Yes.  ;  Nutrition and fluids offered: Yes  Toileting and hygiene offered: Yes  Sitter present: pt no on suicide watch.   No sitter present.   Q 15 min checks Law enforcement present: Yes

## 2015-05-09 NOTE — BH Assessment (Signed)
Referral information for Inpatient Psychiatric Treatment have been faxed to;    [facility] [phone])   [phone])   [phone])    [phone])   [facility]([phone])   [facility] [phone])   Declined  [facility]([phone])-Don't take Adult [facility] ([place]-[phone])-No Beds

## 2015-05-09 NOTE — ED Notes (Signed)
BEHAVIORAL HEALTH ROUNDING Patient sleeping: No. Patient alert and oriented: yes Behavior appropriate: Yes.  ; If no, describe: y  Nutrition and fluids offered: Yes  Toileting and hygiene offered: Yes  Sitter present: yes Law enforcement present: Yes

## 2015-05-09 NOTE — ED Notes (Signed)
Into room to give medications. Pt not talking. Explained to pt that I needed to give his medications and pt became upset. Pt started yelling and cursing at this nurse. Pt shaking. Asked officer to step into the room. Pt became more upset.

## 2015-05-09 NOTE — ED Notes (Signed)
Pharmacy called for valproic acid. Will administer when received.

## 2015-05-09 NOTE — ED Notes (Signed)
TTS consult via computer in progress

## 2015-05-10 DIAGNOSIS — F250 Schizoaffective disorder, bipolar type: Secondary | ICD-10-CM

## 2015-05-10 LAB — CLOZAPINE (CLOZARIL)
Clozapine Lvl: 200 ng/mL — ABNORMAL LOW (ref 350–650)
NorClozapine: 50 ng/mL
TOTAL(CLOZ+ NORCLOZ): 250 ng/mL

## 2015-05-10 LAB — CBC WITH DIFFERENTIAL/PLATELET
Basophils Absolute: 0 10*3/uL (ref 0–0.1)
Basophils Relative: 0 %
EOS PCT: 6 %
Eosinophils Absolute: 0.4 10*3/uL (ref 0–0.7)
HEMATOCRIT: 33.7 % — AB (ref 40.0–52.0)
Hemoglobin: 11 g/dL — ABNORMAL LOW (ref 13.0–18.0)
LYMPHS PCT: 27 %
Lymphs Abs: 1.8 10*3/uL (ref 1.0–3.6)
MCH: 32.6 pg (ref 26.0–34.0)
MCHC: 32.7 g/dL (ref 32.0–36.0)
MCV: 99.7 fL (ref 80.0–100.0)
MONO ABS: 0.7 10*3/uL (ref 0.2–1.0)
MONOS PCT: 10 %
NEUTROS ABS: 3.8 10*3/uL (ref 1.4–6.5)
Neutrophils Relative %: 57 %
PLATELETS: 198 10*3/uL (ref 150–440)
RBC: 3.38 MIL/uL — ABNORMAL LOW (ref 4.40–5.90)
RDW: 14.9 % — AB (ref 11.5–14.5)
WBC: 6.6 10*3/uL (ref 3.8–10.6)

## 2015-05-10 MED ORDER — ZIPRASIDONE MESYLATE 20 MG IM SOLR
20.0000 mg | Freq: Two times a day (BID) | INTRAMUSCULAR | Status: DC
  Administered 2015-05-10 – 2015-05-12 (×5): 20 mg via INTRAMUSCULAR
  Filled 2015-05-10 (×6): qty 20

## 2015-05-10 MED ORDER — PANTOPRAZOLE SODIUM 40 MG PO TBEC
40.0000 mg | DELAYED_RELEASE_TABLET | Freq: Every day | ORAL | Status: DC
  Administered 2015-05-11 – 2015-05-12 (×2): 40 mg via ORAL
  Filled 2015-05-10 (×3): qty 1

## 2015-05-10 MED ORDER — CLOZAPINE 100 MG PO TABS
100.0000 mg | ORAL_TABLET | Freq: Every morning | ORAL | Status: DC
  Administered 2015-05-11 – 2015-05-12 (×2): 100 mg via ORAL
  Filled 2015-05-10 (×2): qty 4

## 2015-05-10 NOTE — ED Notes (Signed)
Pt in room. No complaints or concerns voiced at this time. No abnormal behavior noted at this time. Will continue to monitor with q15 min checks. ODS officer in area. 

## 2015-05-10 NOTE — ED Notes (Signed)

## 2015-05-10 NOTE — ED Notes (Signed)
Lunch provided along with an extra drink   Appropriate to stimulation  No verbalized needs or concerns at this time   I have ordered him an extra lunch tray  NAD assessed  Continue to monitor

## 2015-05-10 NOTE — ED Notes (Signed)
Pt has stuffed his extra food into a urinal and then emptied his colostomy bag into the urinal with the food - pt then demanding another food tray   - he will wait for a supper tray

## 2015-05-10 NOTE — ED Notes (Signed)
Back to room to give injection and pt noted to be on bed being held by officer [place]. Pt still agitated. Pt held for injection by staff. Pt hold and medication administration observed by York Spaniel, RN until completion. Pt released immediately after injection. No injuries noted or reports. Pt calmed immediately. Pt stating he will continue to be calm.

## 2015-05-10 NOTE — ED Notes (Signed)
He has observed the officer in the hallway and he has decided to cuss and yell at him - pt verbalizing threats at the officer  - pt was able to be verbally deescalated at this time  Continue to monitor closely

## 2015-05-10 NOTE — ED Notes (Signed)
BEHAVIORAL HEALTH ROUNDING Patient sleeping: No. Patient alert and oriented: yes Behavior appropriate: Yes.  ; If no, describe:  Nutrition and fluids offered: yes Toileting and hygiene offered: Yes  Sitter present: q15 minute observations and security camera monitoring Law enforcement present: Yes  ODS  

## 2015-05-10 NOTE — ED Notes (Signed)
BEHAVIORAL HEALTH ROUNDING Patient sleeping: No. Patient alert and oriented: yes Behavior appropriate: Yes.  ; If no, describe:  Nutrition and fluids offered: yes Toileting and hygiene offered: Yes  Sitter present: q15 minute observations and security camera monitoring Law enforcement present: Yes  ODS  He can be observed lying in bed with his eyes open - as I entered his room he closed his eyes really tight

## 2015-05-10 NOTE — ED Notes (Signed)
BEHAVIORAL HEALTH ROUNDING Patient sleeping: No. Patient alert and oriented: yes Behavior appropriate: Yes.  ; If no, describe:  Nutrition and fluids offered: yes Toileting and hygiene offered: Yes  Sitter present: q15 minute observations and security camera monitoring Law enforcement present: Yes  ODS  Pt refuses to allow me to draw any labs on him

## 2015-05-10 NOTE — ED Notes (Signed)
I have attempted to give his am medications again and he has refused again

## 2015-05-10 NOTE — ED Notes (Signed)
BEHAVIORAL HEALTH ROUNDING Patient sleeping: Yes.   Patient alert and oriented: not applicable SLEEPING Behavior appropriate: Yes.  ; If no, describe: SLEEPING Nutrition and fluids offered: No SLEEPING Toileting and hygiene offered: NoSLEEPING Sitter present: not applicable Law enforcement present: Yes ODS 

## 2015-05-10 NOTE — Progress Notes (Signed)
Pharmacy Note   Pharmacy consulted for clozapine monitoring in this 49 year old male continuing on home clozapine regimen.  Current orders for clozapine 50mg  PO QAM and 400mg  PO QHS  Patient eligible for clozaril in registry, last labs submitted 05/06/15, ANC: 4500. Monitored weekly.   Will recheck [place] 05/13/15 and submit to registry  Rexene Edison, PharmD Clinical Pharmacist  05/10/2015 1:55 PM
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
## 2015-05-10 NOTE — ED Notes (Signed)
BEHAVIORAL HEALTH ROUNDING Patient sleeping: No. Patient alert and oriented: yes Behavior appropriate: Yes.   Nutrition and fluids offered: Yes  Toileting and hygiene offered: Yes  Sitter present: q15 min observations Law enforcement present: Yes [employer] 

## 2015-05-10 NOTE — ED Notes (Addendum)
ED [facility] [place] Is the patient under IVC or is there intent for IVC: Yes.   Is the patient medically cleared: Yes.   Is there vacancy in the ED BHU: Yes.   Is the population mix appropriate for patient: Yes.   Is the patient awaiting placement in inpatient or outpatient setting:    Has the patient had a psychiatric consult: Yes.   Survey of unit performed for contraband, proper placement and condition of furniture, tampering with fixtures in bathroom, shower, and each patient room: Yes.  ; Findings:  APPEARANCE/BEHAVIOR Calm and cooperative NEURO ASSESSMENT Orientation: oriented x3  Denies pain Hallucinations: No.None noted (Hallucinations) Speech: Normal Gait: normal RESPIRATORY ASSESSMENT Even  Unlabored respirations  CARDIOVASCULAR ASSESSMENT         Skin warm and dry   GASTROINTESTINAL ASSESSMENT no GI complaint EXTREMITIES Full ROM  PLAN OF CARE Provide calm/safe environment. Vital signs assessed twice daily. ED BHU Assessment once each 12-hour shift. Collaborate with intake RN daily or as condition indicates. Assure the ED provider has rounded once each shift. Provide and encourage hygiene. Provide redirection as needed. Assess for escalating behavior; address immediately and inform ED provider.  Assess family dynamic and appropriateness for visitation as needed: Yes.  ; If necessary, describe findings:  Educate the patient/family about BHU procedures/visitation: Yes.  ; If necessary, describe findings:

## 2015-05-10 NOTE — ED Notes (Signed)
Breakfast provided  Appropriate to stimulation  No verbalized needs or concerns at this time  NAD assessed  Continue to monitor 

## 2015-05-10 NOTE — ED Notes (Signed)
Report received from RN Amy T  Pt in room. No complaints or concerns voiced at this time. No abnormal behavior noted at this time. Will continue to monitor with q15 min checks. ODS officer in area. 

## 2015-05-10 NOTE — ED Notes (Signed)
Extra tray provided

## 2015-05-10 NOTE — ED Notes (Signed)
Pt observed lying in bed with his TV on  Pt visualized with NAD  No verbalized needs or concerns at this time  Continue to monitor

## 2015-05-10 NOTE — ED Notes (Signed)
Dr Burlene Arnt to bedside and pt remained agitated. Orders received for Geodon.

## 2015-05-10 NOTE — ED Notes (Signed)
Supper provided along with an extra drink  Pt observed with no unusual behavior  Appropriate to stimulation  No verbalized needs or concerns at this time  NAD assessed  Continue to monitor 
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
## 2015-05-10 NOTE — ED Notes (Signed)
BEHAVIORAL HEALTH ROUNDING  Patient sleeping: No.  Patient alert and oriented: yes  Behavior appropriate: Yes. ; If no, describe:  Nutrition and fluids offered: Yes  Toileting and hygiene offered: Yes  Sitter present: not applicable  Law enforcement present: Yes [facility]  

## 2015-05-10 NOTE — BHH Counselor (Signed)
Pt is on waiting list for [facility].

## 2015-05-10 NOTE — ED Notes (Signed)

## 2015-05-10 NOTE — ED Notes (Signed)
Pt is talkative, calm and relaxed, consented to blood draw and took pills previously refused.  Pt apologized for earlier actions and thanked for care.

## 2015-05-11 DIAGNOSIS — F250 Schizoaffective disorder, bipolar type: Secondary | ICD-10-CM | POA: Insufficient documentation

## 2015-05-11 NOTE — ED Notes (Signed)
Pt took a shower and pulled his colostomy bag off, emptied it into the trashcan, and threw away bag. New colostomy/wafer ordered and applied to pt. New behavior scrubs applied. Pt tolerated well.

## 2015-05-11 NOTE — ED Notes (Signed)
Psychotherapeutic Services ACT team came by for information on [employer]. She wants to be notified when pt is moved downstairs to [employer]. She also states she is faxing over pts labwork when she gets back to office. [phone]; Crisis [phone].

## 2015-05-11 NOTE — ED Notes (Signed)
Pt eating breakfast 

## 2015-05-11 NOTE — Consult Note (Signed)
  Psychiatry: Follow-up consult for this 49 year old man with a history of schizophrenia. I tried to interview the patient today. He was wide awake and made brief eye contact but refused to speak to me. He didn't move and didn't answer any of my questions. Nursing tells me that he has been more compliant with his medicine. They also tell me that he has gotten up and dumped his colostomy bag inside the bathroom and made other bizarre behaviors although he doesn't seem to be as angry or hostile as he was previously. Her Payton Mccallum I had hoped that if his cooperation and behavior improved we could consider hospitalization here but with what they are describing I still don't think that's appropriate. We have put in a [facility]. Continue his current medicine with the clozapine as well as the Geodon that I have added. Follow-up daily. We may be able to consider admission if his behavior continues to improve some. No symptoms of schizophrenia. I do note that his blood pressure has been running quite high. Not consistent however. We will keep an eye on that and may consider adjusting his medicine if it stays high.

## 2015-05-11 NOTE — ED Notes (Signed)

## 2015-05-11 NOTE — ED Notes (Signed)

## 2015-05-11 NOTE — ED Notes (Signed)
Pt sleeping. Breakfast tray put at bedside

## 2015-05-11 NOTE — ED Notes (Signed)

## 2015-05-11 NOTE — ED Notes (Signed)
Pt has clogged up sink in room by draining his ostomy bag into the sink along with urinating into the sink. Flow Coordinator Lea notifed and work order being put in.  Pt informed not to use sink for the rest of the night, and door will not be allowed to be closed for the rest of the night.

## 2015-05-11 NOTE — ED Notes (Signed)
Pt in room. No complaints or concerns voiced at this time. No abnormal behavior noted at this time. Will continue to monitor with q15 min checks. ODS officer in area. 

## 2015-05-11 NOTE — ED Notes (Signed)
BEHAVIORAL HEALTH ROUNDING Patient sleeping: Yes.   Patient alert and oriented: not applicable Behavior appropriate: Yes.    Nutrition and fluids offered: No Toileting and hygiene offered: No Sitter present: q15 minute observations Law enforcement present: Yes [employer] 

## 2015-05-11 NOTE — ED Notes (Signed)

## 2015-05-11 NOTE — ED Provider Notes (Signed)
-----------------------------------------   7:32 AM on 05/11/2015 -----------------------------------------   Blood pressure 133/82, pulse 83, temperature 99.4 F (37.4 C), temperature source Oral, resp. rate 18, height 5\' 11"  (1.803 m), weight 158 lb (71.668 kg), SpO2 99 %.  The patient had no acute events since last update.  Calm and cooperative at this time.  Disposition is pending per Psychiatry/Behavioral Medicine team recommendations.     Schuyler Amor, MD 05/11/15 [phone]

## 2015-05-11 NOTE — ED Notes (Signed)
IVC/Pending placement 

## 2015-05-12 ENCOUNTER — Inpatient Hospital Stay
Admit: 2015-05-12 | Discharge: 2015-05-17 | DRG: 885 | Disposition: A | Discharge status: Home / Self Care | Payer: Medicaid Other | Source: Intra-hospital | Attending: Psychiatry | Admitting: Psychiatry

## 2015-05-12 DIAGNOSIS — F209 Schizophrenia, unspecified: Secondary | ICD-10-CM | POA: Insufficient documentation

## 2015-05-12 DIAGNOSIS — I10 Essential (primary) hypertension: Secondary | ICD-10-CM | POA: Diagnosis present

## 2015-05-12 DIAGNOSIS — N3944 Nocturnal enuresis: Secondary | ICD-10-CM | POA: Diagnosis present

## 2015-05-12 DIAGNOSIS — G4700 Insomnia, unspecified: Secondary | ICD-10-CM | POA: Diagnosis present

## 2015-05-12 DIAGNOSIS — Z9114 Patient's other noncompliance with medication regimen: Secondary | ICD-10-CM

## 2015-05-12 DIAGNOSIS — F22 Delusional disorders: Secondary | ICD-10-CM | POA: Diagnosis not present

## 2015-05-12 DIAGNOSIS — R45851 Suicidal ideations: Secondary | ICD-10-CM | POA: Diagnosis present

## 2015-05-12 DIAGNOSIS — Z933 Colostomy status: Secondary | ICD-10-CM

## 2015-05-12 DIAGNOSIS — F250 Schizoaffective disorder, bipolar type: Secondary | ICD-10-CM | POA: Diagnosis present

## 2015-05-12 DIAGNOSIS — Z85038 Personal history of other malignant neoplasm of large intestine: Secondary | ICD-10-CM | POA: Diagnosis not present

## 2015-05-12 DIAGNOSIS — R45850 Homicidal ideations: Secondary | ICD-10-CM | POA: Diagnosis present

## 2015-05-12 DIAGNOSIS — Z9119 Patient's noncompliance with other medical treatment and regimen: Secondary | ICD-10-CM

## 2015-05-12 DIAGNOSIS — Z9049 Acquired absence of other specified parts of digestive tract: Secondary | ICD-10-CM | POA: Diagnosis not present

## 2015-05-12 DIAGNOSIS — R451 Restlessness and agitation: Secondary | ICD-10-CM | POA: Diagnosis present

## 2015-05-12 DIAGNOSIS — F17200 Nicotine dependence, unspecified, uncomplicated: Secondary | ICD-10-CM | POA: Diagnosis present

## 2015-05-12 DIAGNOSIS — A4902 Methicillin resistant Staphylococcus aureus infection, unspecified site: Secondary | ICD-10-CM | POA: Diagnosis present

## 2015-05-12 DIAGNOSIS — K219 Gastro-esophageal reflux disease without esophagitis: Secondary | ICD-10-CM | POA: Diagnosis present

## 2015-05-12 MED ORDER — CLOZAPINE 100 MG PO TABS
400.0000 mg | ORAL_TABLET | Freq: Every day | ORAL | Status: DC
  Filled 2015-05-12: qty 4

## 2015-05-12 MED ORDER — ACETAMINOPHEN 325 MG PO TABS
650.0000 mg | ORAL_TABLET | Freq: Four times a day (QID) | ORAL | Status: DC | PRN
  Administered 2015-05-13 – 2015-05-15 (×2): 650 mg via ORAL
  Filled 2015-05-12 (×2): qty 2

## 2015-05-12 MED ORDER — VALPROIC ACID 250 MG PO CAPS
750.0000 mg | ORAL_CAPSULE | Freq: Two times a day (BID) | ORAL | Status: DC
  Filled 2015-05-12 (×2): qty 3

## 2015-05-12 MED ORDER — MAGNESIUM HYDROXIDE 400 MG/5ML PO SUSP
30.0000 mL | Freq: Every day | ORAL | Status: DC | PRN
Start: 2015-05-12 — End: 2015-05-17

## 2015-05-12 MED ORDER — CLOZAPINE 25 MG PO TABS: 100.0000 mg | ORAL_TABLET | Freq: Every morning | ORAL | Status: DC

## 2015-05-12 MED ORDER — GLYCOPYRROLATE 1 MG PO TABS
1.0000 mg | ORAL_TABLET | Freq: Three times a day (TID) | ORAL | Status: DC
  Filled 2015-05-12 (×3): qty 1

## 2015-05-12 MED ORDER — PANTOPRAZOLE SODIUM 40 MG PO TBEC
40.0000 mg | DELAYED_RELEASE_TABLET | Freq: Every day | ORAL | Status: DC
  Administered 2015-05-13 – 2015-05-17 (×5): 40 mg via ORAL
  Filled 2015-05-12 (×5): qty 1

## 2015-05-12 MED ORDER — ALUM & MAG HYDROXIDE-SIMETH 200-200-20 MG/5ML PO SUSP: 30.0000 mL | ORAL | Status: DC | PRN

## 2015-05-12 MED ORDER — AMLODIPINE BESYLATE 10 MG PO TABS: 10.0000 mg | ORAL_TABLET | Freq: Every day | ORAL | Status: DC

## 2015-05-12 MED ORDER — LAMOTRIGINE 25 MG PO TABS: 50.0000 mg | ORAL_TABLET | Freq: Every day | ORAL | Status: DC

## 2015-05-12 MED ORDER — ZIPRASIDONE MESYLATE 20 MG IM SOLR
20.0000 mg | Freq: Two times a day (BID) | INTRAMUSCULAR | Status: DC
  Administered 2015-05-13: 20 mg via INTRAMUSCULAR
  Filled 2015-05-12: qty 20

## 2015-05-12 NOTE — ED Notes (Signed)
Report to Margaret, RN

## 2015-05-12 NOTE — ED Notes (Signed)
Pt ostomy bag changed, and at pt's request given change of clothes, requested 2 x pants "cause I'm cold natured" -- given, meal tray and OJ given
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
## 2015-05-12 NOTE — ED Notes (Signed)
First call for report, Thayer Headings states RN starting med pass, will call back for report, # given

## 2015-05-12 NOTE — ED Notes (Signed)
Pt taken over to shower by Solmon Ice, ED tech

## 2015-05-12 NOTE — Progress Notes (Signed)
Pt. is to be admitted to [facility] by Dr. Weber Cooks. Attending Physician will be Dr. Bary Leriche Pt. has been assigned to room 320, by [place]. Intake Paper Work has been signed and placed on pt. chart. ER staff [facility] Dr. Marcelene Butte, ER MD; Mitchell Heir Patient's Nurse & Renee Patient Access) have been made aware of the admission.   05/12/2015 Con Memos, MS, [place], [facility] Therapeutic Triage Specialist

## 2015-05-12 NOTE — ED Provider Notes (Signed)
-----------------------------------------   6:21 AM on 05/12/2015 -----------------------------------------   Blood pressure 127/96, pulse 121, temperature 99.2 F (37.3 C), temperature source Oral, resp. rate 20, height 5\' 11"  (1.803 m), weight 158 lb (71.668 kg), SpO2 100 %.  The patient had no acute events since last update.  Calm and cooperative at this time.  Disposition is pending per Psychiatry/Behavioral Medicine team recommendations.     Paulette Blanch, MD 05/12/15 [id number]

## 2015-05-12 NOTE — Progress Notes (Signed)
TTS faxed pts paperwork down to the [facility] unit for the charge nurse to review for possible admission. TTS has confirmed that the pt has a colostomy bag and has cared for himself independently while in the ed. Per nursing report materials management has supplies neccessary to care for the pt if needed.   05/12/2015 Con Memos, MS, [place], [facility] Therapeutic Triage Specialist

## 2015-05-12 NOTE — ED Notes (Signed)
Pt eating lunch at this time

## 2015-05-12 NOTE — Progress Notes (Signed)
This is a 48 y.o. AA male with a h/o Schizoaffective d/o that has been placed under IVC with hearing voices telling him to kill people in the group home. Pt. Has been accepted for inpatient services by Dr.Clapacs/Dr. Bary Leriche to room 320-A on the Inpatient Behavioral Medicine Unit. Client presented to the ED-BHU @ 2054; after having received report from Marrion Coy., RN @ 2040; and entered into room #1. Pt. Received a tour of the unit; fluids/snack was given; Pt. Received p.m. Medications with out difficulty; and report was called to Burnard Bunting, RN on the inpatient unit.

## 2015-05-12 NOTE — ED Notes (Signed)
Report from Rebecca, RN

## 2015-05-13 ENCOUNTER — Encounter: Payer: Self-pay | Admitting: Psychiatry

## 2015-05-13 DIAGNOSIS — F17200 Nicotine dependence, unspecified, uncomplicated: Secondary | ICD-10-CM | POA: Diagnosis present

## 2015-05-13 DIAGNOSIS — A4902 Methicillin resistant Staphylococcus aureus infection, unspecified site: Secondary | ICD-10-CM | POA: Diagnosis present

## 2015-05-13 LAB — CBC WITH DIFFERENTIAL/PLATELET
BASOS ABS: 0 10*3/uL (ref 0–0.1)
Basophils Relative: 0 %
EOS PCT: 3 %
Eosinophils Absolute: 0.3 10*3/uL (ref 0–0.7)
HCT: 38.5 % — ABNORMAL LOW (ref 40.0–52.0)
Hemoglobin: 12.3 g/dL — ABNORMAL LOW (ref 13.0–18.0)
LYMPHS PCT: 32 %
Lymphs Abs: 3.4 10*3/uL (ref 1.0–3.6)
MCH: 32 pg (ref 26.0–34.0)
MCHC: 31.8 g/dL — AB (ref 32.0–36.0)
MCV: 100.4 fL — AB (ref 80.0–100.0)
MONO ABS: 0.9 10*3/uL (ref 0.2–1.0)
MONOS PCT: 9 %
Neutro Abs: 5.8 10*3/uL (ref 1.4–6.5)
Neutrophils Relative %: 56 %
PLATELETS: 231 10*3/uL (ref 150–440)
RBC: 3.84 MIL/uL — ABNORMAL LOW (ref 4.40–5.90)
RDW: 14.9 % — AB (ref 11.5–14.5)
WBC: 10.4 10*3/uL (ref 3.8–10.6)

## 2015-05-13 LAB — MRSA PCR SCREENING: MRSA by PCR: POSITIVE — AB

## 2015-05-13 MED ORDER — VALPROIC ACID 250 MG PO CAPS
750.0000 mg | ORAL_CAPSULE | Freq: Two times a day (BID) | ORAL | Status: DC
  Administered 2015-05-13 – 2015-05-17 (×9): 750 mg via ORAL
  Filled 2015-05-13 (×10): qty 3

## 2015-05-13 MED ORDER — CLOZAPINE 25 MG PO TABS
100.0000 mg | ORAL_TABLET | Freq: Every day | ORAL | Status: DC
  Administered 2015-05-13 – 2015-05-17 (×5): 100 mg via ORAL
  Filled 2015-05-13 (×5): qty 4

## 2015-05-13 MED ORDER — LAMOTRIGINE 25 MG PO TABS
50.0000 mg | ORAL_TABLET | Freq: Every day | ORAL | Status: DC
  Administered 2015-05-13: 50 mg via ORAL
  Filled 2015-05-13: qty 2

## 2015-05-13 MED ORDER — AMLODIPINE BESYLATE 10 MG PO TABS
10.0000 mg | ORAL_TABLET | Freq: Every day | ORAL | Status: DC
  Administered 2015-05-13 – 2015-05-17 (×5): 10 mg via ORAL
  Filled 2015-05-13 (×5): qty 1

## 2015-05-13 MED ORDER — GLYCOPYRROLATE 1 MG PO TABS
1.0000 mg | ORAL_TABLET | Freq: Three times a day (TID) | ORAL | Status: DC
  Administered 2015-05-13 – 2015-05-17 (×14): 1 mg via ORAL
  Filled 2015-05-13 (×15): qty 1

## 2015-05-13 MED ORDER — TRAZODONE HCL 100 MG PO TABS
100.0000 mg | ORAL_TABLET | Freq: Every day | ORAL | Status: DC
  Administered 2015-05-13: 100 mg via ORAL
  Filled 2015-05-13: qty 1

## 2015-05-13 MED ORDER — ZIPRASIDONE HCL 40 MG PO CAPS
80.0000 mg | ORAL_CAPSULE | Freq: Two times a day (BID) | ORAL | Status: DC
  Administered 2015-05-13 – 2015-05-17 (×8): 80 mg via ORAL
  Filled 2015-05-13 (×8): qty 2

## 2015-05-13 MED ORDER — CLOZAPINE 25 MG PO TABS
400.0000 mg | ORAL_TABLET | Freq: Every day | ORAL | Status: DC
  Administered 2015-05-13 – 2015-05-15 (×3): 400 mg via ORAL
  Filled 2015-05-13 (×2): qty 16

## 2015-05-13 NOTE — Tx Team (Signed)
Initial Interdisciplinary Treatment Plan   PATIENT STRESSORS: Health problems Medication change or noncompliance   PATIENT STRENGTHS: Capable of independent living Motivation for treatment/growth   PROBLEM LIST: Problem List/Patient Goals Date to be addressed Date deferred Reason deferred Estimated date of resolution  Anger/Aggression Symptoms 05/12/2015     Homicidal Ideations 05/12/2015     Psychosis 05/12/2015     Depressive Symptoms 05/12/2015                                    DISCHARGE CRITERIA:  Improved stabilization in mood, thinking, and/or behavior Motivation to continue treatment in a less acute level of care Safe-care adequate arrangements made  PRELIMINARY DISCHARGE PLAN: Attend aftercare/continuing care group Return to previous living arrangement  PATIENT/FAMIILY INVOLVEMENT: This treatment plan has been presented to and reviewed with the patient, Mario Ortega, and/or family member.  The patient and family have been given the opportunity to ask questions and make suggestions.  Mario Ortega T [name] 05/13/2015, 4:00 AM

## 2015-05-13 NOTE — BHH Suicide Risk Assessment (Signed)
[facility] Admission Suicide Risk Assessment   Nursing information obtained from:    Demographic factors:    Current Mental Status:    Loss Factors:    Historical Factors:    Risk Reduction Factors:    Total Time spent with patient: 1 hour Principal Problem: Schizoaffective disorder, bipolar type ([place]) Diagnosis:   Patient Active Problem List   Diagnosis Date Noted  . Tobacco use disorder [F17.200] 05/13/2015  . MRSA (methicillin resistant Staphylococcus aureus) [A49.02] 05/13/2015  . Schizophrenia ([place]) [F20.9] 05/12/2015  . Schizoaffective disorder, bipolar type ([place]) [F25.0]   . HTN (hypertension) [I10] 05/09/2015  . GERD (gastroesophageal reflux disease) [K21.9] 05/09/2015  . GI bleed [K92.2] 04/07/2015     Continued Clinical Symptoms:  Alcohol Use Disorder Identification Test Final Score (AUDIT): 0 The "Alcohol Use Disorders Identification Test", Guidelines for Use in Primary Care, Second Edition.  World Pharmacologist [facility]). Score between 0-7:  no or low risk or alcohol related problems. Score between 8-15:  moderate risk of alcohol related problems. Score between 16-19:  high risk of alcohol related problems. Score 20 or above:  warrants further diagnostic evaluation for alcohol dependence and treatment.   CLINICAL FACTORS:   Schizophrenia:   Command hallucinatons Paranoid or undifferentiated type   Musculoskeletal: Strength & Muscle Tone: within normal limits Gait & Station: normal Patient leans: N/A  Psychiatric Specialty Exam: Physical Exam  Nursing note and vitals reviewed. Constitutional: He is oriented to person, place, and time. He appears well-developed and well-nourished.  HENT:  Head: Normocephalic.  Eyes: Conjunctivae and EOM are normal. Pupils are equal, round, and reactive to light.  Neck: Normal range of motion. Neck supple.  Cardiovascular: Normal rate, regular rhythm and normal heart sounds.   Respiratory: Effort normal and breath sounds  normal.  GI: Soft. Bowel sounds are normal.  Colostomy bag.  Musculoskeletal: Normal range of motion.  Neurological: He is alert and oriented to person, place, and time. He has normal reflexes.  Skin: Skin is warm and dry.    Review of Systems  Psychiatric/Behavioral: Positive for hallucinations. The patient has insomnia.   All other systems reviewed and are negative.   Blood pressure 121/89, pulse 115, temperature 98.3 F (36.8 C), temperature source Oral, resp. rate 18, height 5' 11.5" (1.816 m), weight 69.4 kg (153 lb), SpO2 100 %.Body mass index is 21.04 kg/(m^2).  General Appearance: Casual  Eye Contact::  Good  Speech:  Clear and Coherent  Volume:  Normal  Mood:  Euphoric  Affect:  Appropriate  Thought Process:  Disorganized  Orientation:  Full (Time, Place, and Person)  Thought Content:  Delusions, Hallucinations: Auditory and Paranoid Ideation  Suicidal Thoughts:  Yes.  without intent/plan  Homicidal Thoughts:  No  Memory:  Immediate;   Fair Recent;   Fair Remote;   Fair  Judgement:  Impaired  Insight:  Shallow  Psychomotor Activity:  Increased  Concentration:  Fair  Recall:  [place]: Fair  Akathisia:  No  Handed:  Right  AIMS (if indicated):     Assets:  Communication Skills Desire for Improvement Financial Resources/Insurance Housing Physical Health Resilience Social Support  Sleep:  Number of Hours: 8.3  Cognition: WNL  ADL's:  Intact     COGNITIVE FEATURES THAT CONTRIBUTE TO RISK:  None    SUICIDE RISK:   Mild:  Suicidal ideation of limited frequency, intensity, duration, and specificity.  There are no identifiable plans, no associated intent, mild dysphoria and related symptoms,  good self-control (both objective and subjective assessment), few other risk factors, and identifiable protective factors, including available and accessible social support.  PLAN OF CARE: hospital admission, medication management, discharge  planning.  Medical Decision Making:  New problem, with additional work up planned, Review of Psycho-Social Stressors (1), Review or order clinical lab tests (1), Review of Medication Regimen & Side Effects (2) and Review of New Medication or Change in Dosage (2)   Mario Ortega is a 49 year old male with a history of schizoaffective disorder admitted for disorganized, agitated, threatening behavior at the group home in the context of medication noncompliance.  1. Agitation. This has resolved  2. Suicidal and homicidal ideation. The patient is able to contract for safety in the hospital.  3. Psychosis. The patient has not been compliant with clozapine at the group home. Clozaril level was only 250. We restarted Clozaril. The patient refused several doses in the emergency room but now agrees to take his medications. Geodon injections were elevated in the emergency room to address psychosis and partial compliance with clozaril. The patient agrees to take Geodon by mouth. We'll give 80 mg twice daily.  4. Mood. The patient was started on Depakote. We will check the level on Friday.  5. GERD. We'll continue Protonix . 6. Hypertension. We'll continue Norvasc.  7. Disposition. The patient isn't allowed to return to his group home although they've been looking for another placement. He will follow-up with PSI act team.  I certify that inpatient services furnished can reasonably be expected to improve the patient's condition.   Mario Ortega 05/13/2015, 2:55 PM
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
## 2015-05-13 NOTE — Progress Notes (Signed)
Recreation Therapy Notes  INPATIENT RECREATION THERAPY ASSESSMENT  Patient Details Name: Devonta Bohren MRN: [id number] DOB: 1965-09-13 Today's Date: 05/13/2015  Patient Stressors:  Patient reported no stressors.  Coping Skills:   Isolate, Arguments, Exercise, Art/Dance, Talking, Sports, Music, Other (Comment) (Do beads)  Personal Challenges: Anger, Trusting Others  Leisure Interests (2+):  Individual - Other (Comment) (Read magazines, read history books)  Awareness of Community Resources:  Yes  Community Resources:  Park  Current Use: Yes  If no, Barriers?:    Patient Strengths:  Goes to church, expresses feelings  Patient Identified Areas of Improvement:  Self-control - terrible temper  Current Recreation Participation:  Working for group home owners  Patient Goal for Hospitalization:  To go to [employer] of Residence:  [place] of Residence:  [place]   Current SI (including self-harm):  No  Current HI:  No  Consent to Intern Participation: N/A   Leonette Monarch, LRT/CTRS 05/13/2015, 2:23 PM

## 2015-05-13 NOTE — BHH Group Notes (Signed)
[place] Group Notes:  (Nursing/MHT/Case Management/Adjunct)  Date:  05/13/2015  Time:  1:51 PM  Type of Therapy:  Psychoeducational Skills  Participation Level:  Minimal  Participation Quality:  Inattentive and Redirectable  Affect:  Defensive  Cognitive:  Lacking  Insight:  Lacking  Engagement in Group:  Limited  Modes of Intervention:  Activity  Summary of Progress/Problems:  Mario Ortega Mario Ortega 05/13/2015, 1:51 PM

## 2015-05-13 NOTE — Progress Notes (Signed)
Recreation Therapy Notes  Date: 11.10.16 Time: 3:00 pm Location: Craft Room  Group Topic: Self-expression, Coping Skills  Goal Area(s) Addresses:  Patient will effectively use art as a means of self-expression. Patient will recognize positive benefit of self-expression. Patient will be able to identify one emotion experienced during group discussion. Patient will identify use of art as a coping skill.  Behavioral Response: Arrived late, Attentive, Interactive  Intervention: Two Faces of Me  Activity: Patients were given a blank face worksheet and instructed to draw a line down the middle of the worksheet. On one side, patients were instructed to draw how they felt when they were admitted to the hospital and on the other side they were instructed to draw how they want to feel when they are d/c from the hospital.  Education: LRT educated patients on how art is a good Technical sales engineer.  Education Outcome: In group clarification offered   Clinical Observations/Feedback: Patient arrived to group at approximately 3:23 pm. LRT explained activity. Patient worked on activity. Patient contributed to group discussion by stating how he felt when he was admitted and how he wants to feel when he is d/c, what he has learned since being in the hospital to help him with positive change, and what types of feelings he felt during group. Patient did talk about God and the devil. LRT redirected patient. Patient complied.  Leonette Monarch, LRT/CTRS 05/13/2015 4:14 PM

## 2015-05-13 NOTE — Clinical Social Work Note (Signed)
CSW called group home [facility] and Manuela Schwartz stated patient can return but is looking for patient a group home in [place] where he came from. Patient was there 17 years and stable but recently was placed at [facility] and discharge to [facility] on 04/02/15. She reports she has been talking to [facility] in [place] and would like for patient to go there if possible but can return to her group home if nothing is found by discharge.

## 2015-05-13 NOTE — Progress Notes (Signed)
Patient positive for MRSA, Dr. Gwyndolyn Saxon notified, advised to Maintain Contact Isolation per Guidelines on Psych Unit; call infection control for further advice, no medication ordered at this time.
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
## 2015-05-13 NOTE — Progress Notes (Signed)
Called infection control department at [facility] regarding patient's MRSA in nares. No answer at office or on call phone. Left message for call back.
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
## 2015-05-13 NOTE — Progress Notes (Signed)
Pharmacy Note   Pharmacy consulted for clozapine monitoring in this 49 year old male continuing on home clozapine regimen.  Current orders for clozapine 50mg  PO QAM and 400mg  PO QHS  Patient eligible for clozaril in registry, last labs submitted 05/06/15, [place]: 4500. Monitored weekly.   Will recheck [place] 05/13/15 and submit to registry  Rexene Edison, PharmD Clinical Pharmacist  05/13/2015 9:22 AM   05/13/15 ANC is 5.8 acceptable to continue Clozapine Labs submitted to registry  Will recheck [place] on 05/20/15 Andros Channing K 05/13/2015 9:23 AM

## 2015-05-14 LAB — CBC WITH DIFFERENTIAL/PLATELET
BASOS ABS: 0.1 10*3/uL (ref 0–0.1)
Basophils Relative: 1 %
EOS ABS: 0.5 10*3/uL (ref 0–0.7)
EOS PCT: 5 %
HCT: 34.8 % — ABNORMAL LOW (ref 40.0–52.0)
Hemoglobin: 11.4 g/dL — ABNORMAL LOW (ref 13.0–18.0)
LYMPHS ABS: 3.1 10*3/uL (ref 1.0–3.6)
LYMPHS PCT: 31 %
MCH: 32.6 pg (ref 26.0–34.0)
MCHC: 32.8 g/dL (ref 32.0–36.0)
MCV: 99.5 fL (ref 80.0–100.0)
MONO ABS: 0.9 10*3/uL (ref 0.2–1.0)
Monocytes Relative: 9 %
Neutro Abs: 5.5 10*3/uL (ref 1.4–6.5)
Neutrophils Relative %: 54 %
PLATELETS: 214 10*3/uL (ref 150–440)
RBC: 3.5 MIL/uL — AB (ref 4.40–5.90)
RDW: 14.5 % (ref 11.5–14.5)
WBC: 10.1 10*3/uL (ref 3.8–10.6)

## 2015-05-14 MED ORDER — CLOZAPINE 25 MG PO TABS
ORAL_TABLET | ORAL | Status: AC
Start: 2015-05-14 — End: 2015-05-14
  Administered 2015-05-14: 22:00:00
  Filled 2015-05-14: qty 4

## 2015-05-14 MED ORDER — TEMAZEPAM 15 MG PO CAPS: 30.0000 mg | ORAL_CAPSULE | Freq: Every evening | ORAL | Status: DC | PRN

## 2015-05-14 NOTE — BHH Group Notes (Signed)
[place] LCSW Group Therapy  05/14/2015 4:53 PM  Type of Therapy:  Group Therapy  Participation Level:  Minimal  Participation Quality:  Redirectable  Affect:  Flat  Cognitive:  Disorganized  Insight:  Limited  Engagement in Therapy:  Limited  Modes of Intervention:  Discussion, Education, Socialization and Support  Summary of Progress/Problems: Feelings around Relapse. Group members discussed the meaning of relapse and shared personal stories of relapse, how it affected them and others, and how they perceived themselves during this time. Group members were encouraged to identify triggers, warning signs and coping skills used when facing the possibility of relapse. Social supports were discussed and explored in detail. Fredrick attended group and stayed the entire time. He had difficulties staying on topic but was easily redirectable.   [place] MSW, [place]  05/14/2015, 4:53 PM

## 2015-05-14 NOTE — Plan of Care (Signed)
Problem: [facility] Participation in Recreation Therapeutic Interventions Goal: STG-Patient will identify at least five coping skills for ** STG: Coping Skills - Within 4 treatment sessions, patient will verbalize at least 5 coping skills for anger in each of 2 treatment sessions to increase anger management skills post d/c.  Outcome: Progressing Treatment Session 1; Completed 1 out of 2: At approximately 3:45 pm, LRT met with patient in craft room. Patient verbalized 5 coping skills for anger. Patient verbalized what triggers him to get angry, how his body responds to anger, and how he is going to remember to use his healthy coping skills. LRT provided suggestions as well. Intervention Used: Coping Skills worksheet  Leonette Monarch, LRT/CTRS 11.11.16 4:52 pm
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
## 2015-05-14 NOTE — Progress Notes (Signed)
[facility] MD Progress Note  05/14/2015 8:47 PM Mario Ortega  MRN:  [id number]  Subjective:  Mario Ortega has a history of schizophrenia admitted for agitated behavior in the context of medication noncompliance.  Mario Ortega is no longer agitated. He is floridly psychotic and disorganized. He denies symptoms of depression, anxiety or psychosis. He has no somatic complaints. He accepts medication and tolerates them well. We switched to oral geodon upon his request. His sleep is poor, he slept 4 hours only.  Principal Problem: Schizoaffective disorder, bipolar type ([place]) Diagnosis:   Patient Active Problem List   Diagnosis Date Noted  . Tobacco use disorder [F17.200] 05/13/2015  . MRSA (methicillin resistant Staphylococcus aureus) [A49.02] 05/13/2015  . Schizophrenia ([place]) [F20.9] 05/12/2015  . Schizoaffective disorder, bipolar type ([place]) [F25.0]   . HTN (hypertension) [I10] 05/09/2015  . GERD (gastroesophageal reflux disease) [K21.9] 05/09/2015  . GI bleed [K92.2] 04/07/2015   Total Time spent with patient: 20 minutes  Past Psychiatric History: schizoaffective disorder bipolar type.  Past Medical History:  Past Medical History  Diagnosis Date  . Colostomy in place [facility])   . Hypertension   . Schizo affective schizophrenia ([place])   . GERD (gastroesophageal reflux disease)   . Drug abuse   . MRSA (methicillin resistant staph aureus) culture positive    History reviewed. No pertinent past surgical history. Family History: History reviewed. No pertinent family history. Family Psychiatric  History: nonereported. Social History:  History  Alcohol Use No     History  Drug Use No    Social History   Social History  . Marital Status: Single    Spouse Name: N/A  . Number of Children: N/A  . Years of Education: N/A   Social History Main Topics  . Smoking status: Current Every Day Smoker  . Smokeless tobacco: None  . Alcohol Use: No  . Drug Use: No  . Sexual Activity: Not Asked    Other Topics Concern  . None   Social History Narrative   Additional Social History:                         Sleep: Poor  Appetite:  Fair  Current Medications: Current Facility-Administered Medications  Medication Dose Route Frequency Provider Last Rate Last Dose  . acetaminophen (TYLENOL) tablet 650 mg  650 mg Oral Q6H PRN Gonzella Lex, MD   650 mg at 05/13/15 0827  . alum & mag hydroxide-simeth (MAALOX/MYLANTA) 200-200-20 MG/5ML suspension 30 mL  30 mL Oral Q4H PRN Gonzella Lex, MD      . amLODipine (NORVASC) tablet 10 mg  10 mg Oral Q breakfast Corinna Burkman B Hayden Kihara, MD   10 mg at 05/14/15 0900  . cloZAPine (CLOZARIL) tablet 100 mg  100 mg Oral Q breakfast Reynaldo Rossman B Cathyrn Deas, MD   100 mg at 05/14/15 0859  . cloZAPine (CLOZARIL) tablet 400 mg  400 mg Oral QHS Lois Slagel B Jasmarie Coppock, MD   400 mg at 05/13/15 2357  . glycopyrrolate (ROBINUL) tablet 1 mg  1 mg Oral TID AC Zechariah Bissonnette B Adis Sturgill, MD   1 mg at 05/14/15 1734  . magnesium hydroxide (MILK OF MAGNESIA) suspension 30 mL  30 mL Oral Daily PRN Gonzella Lex, MD      . pantoprazole (PROTONIX) EC tablet 40 mg  40 mg Oral QAC breakfast Gonzella Lex, MD   40 mg at 05/14/15 0900  . traZODone (DESYREL) tablet 100 mg  100 mg Oral QHS  Clovis Fredrickson, MD   100 mg at 05/13/15 2358  . valproic acid (DEPAKENE) 250 MG capsule 750 mg  750 mg Oral BID AC Brittay Mogle B Kallen Delatorre, MD   750 mg at 05/14/15 1734  . ziprasidone (GEODON) capsule 80 mg  80 mg Oral BID WC Corian Handley B Arelis Neumeier, MD   80 mg at 05/14/15 1733    Lab Results:  Results for orders placed or performed during the hospital encounter of 05/12/15 (from the past 48 hour(s))  MRSA PCR Screening     Status: Abnormal   Collection Time: 05/13/15  1:55 AM  Result Value Ref Range   MRSA by PCR POSITIVE (A) NEGATIVE    Comment: READ BACK AND VERIFIED BY KENISHA HERBIN @0318  05/13/15.Marland KitchenMarland KitchenAJO        The GeneXpert MRSA Assay (FDA approved for NASAL specimens only), is  one component of a comprehensive MRSA colonization surveillance program. It is not intended to diagnose MRSA infection nor to guide or monitor treatment for MRSA infections.   CBC with Differential/Platelet     Status: Abnormal   Collection Time: 05/13/15  7:33 AM  Result Value Ref Range   WBC 10.4 3.8 - 10.6 K/uL   RBC 3.84 (L) 4.40 - 5.90 MIL/uL   Hemoglobin 12.3 (L) 13.0 - 18.0 g/dL   HCT 38.5 (L) 40.0 - 52.0 %   MCV 100.4 (H) 80.0 - 100.0 fL   MCH 32.0 26.0 - 34.0 pg   MCHC 31.8 (L) 32.0 - 36.0 g/dL   RDW 14.9 (H) 11.5 - 14.5 %   Platelets 231 150 - 440 K/uL   Neutrophils Relative % 56 %   Neutro Abs 5.8 1.4 - 6.5 K/uL   Lymphocytes Relative 32 %   Lymphs Abs 3.4 1.0 - 3.6 K/uL   Monocytes Relative 9 %   Monocytes Absolute 0.9 0.2 - 1.0 K/uL   Eosinophils Relative 3 %   Eosinophils Absolute 0.3 0 - 0.7 K/uL   Basophils Relative 0 %   Basophils Absolute 0.0 0 - 0.1 K/uL  CBC with Differential/Platelet     Status: Abnormal   Collection Time: 05/14/15  6:12 PM  Result Value Ref Range   WBC 10.1 3.8 - 10.6 K/uL   RBC 3.50 (L) 4.40 - 5.90 MIL/uL   Hemoglobin 11.4 (L) 13.0 - 18.0 g/dL   HCT 34.8 (L) 40.0 - 52.0 %   MCV 99.5 80.0 - 100.0 fL   MCH 32.6 26.0 - 34.0 pg   MCHC 32.8 32.0 - 36.0 g/dL   RDW 14.5 11.5 - 14.5 %   Platelets 214 150 - 440 K/uL   Neutrophils Relative % 54 %   Neutro Abs 5.5 1.4 - 6.5 K/uL   Lymphocytes Relative 31 %   Lymphs Abs 3.1 1.0 - 3.6 K/uL   Monocytes Relative 9 %   Monocytes Absolute 0.9 0.2 - 1.0 K/uL   Eosinophils Relative 5 %   Eosinophils Absolute 0.5 0 - 0.7 K/uL   Basophils Relative 1 %   Basophils Absolute 0.1 0 - 0.1 K/uL    Physical Findings: AIMS:  , ,  ,  ,    CIWA:  CIWA-Ar Total: 0 COWS:     Musculoskeletal: Strength & Muscle Tone: within normal limits Gait & Station: normal Patient leans: N/A  Psychiatric Specialty Exam: Review of Systems  All other systems reviewed and are negative.   Blood pressure 121/89,  pulse 115, temperature 98.3 F (36.8 C), temperature source Oral, resp. rate  18, height 5' 11.5" (1.816 m), weight 69.4 kg (153 lb), SpO2 100 %.Body mass index is 21.04 kg/(m^2).  General Appearance: Casual  Eye Contact::  Fair  Speech:  Clear and Coherent  Volume:  Normal  Mood:  Euthymic  Affect:  Flat  Thought Process:  Disorganized  Orientation:  Full (Time, Place, and Person)  Thought Content:  Delusions, Hallucinations: Auditory and Paranoid Ideation  Suicidal Thoughts:  No  Homicidal Thoughts:  No  Memory:  Immediate;   Fair Recent;   Fair Remote;   Fair  Judgement:  Poor  Insight:  Shallow  Psychomotor Activity:  Normal  Concentration:  Fair  Recall:  [place]: Fair  Akathisia:  No  Handed:  Right  AIMS (if indicated):     Assets:  Communication Skills Desire for Improvement Financial Resources/Insurance Housing Physical Health Resilience  ADL's:  Intact  Cognition: WNL  Sleep:  Number of Hours: 4.15   Treatment Plan Summary: Daily contact with patient to assess and evaluate symptoms and progress in treatment and Medication management   Mr. Kuczkowski is a 49 year old male with a history of schizoaffective disorder admitted for disorganized, agitated, threatening behavior at the group home in the context of medication noncompliance.  1. Agitation. This has resolved  2. Suicidal and homicidal ideation. The patient is able to contract for safety in the hospital.  3. Psychosis. The patient has not been compliant with clozapine at the group home. Clozaril level was only 250. We restarted Clozaril. The patient refused several doses in the emergency room but now agrees to take his medications. Geodon injections were started in the emergency room to address psychosis and partial compliance with clozaril. The patient agreed to take Geodon by mouth. We switched to Geodon 80 mg twice daily.  4. Mood. The patient was started on Depakote. We will  check the level on Saturday.  5. GERD. We'll continue Protonix . 6. Hypertension. We'll continue Norvasc.  7. Insomnia. He did not sleep with Trazodone. We will start restoril.  8. Disposition. The patient is allowed to return to his group home although they've been looking for another placement. He will follow-up with his [facility] act team.  Nicle Connole 05/14/2015, 8:47 PM

## 2015-05-14 NOTE — Plan of Care (Signed)
Problem: Ineffective individual coping Goal: LTG: Patient will report a decrease in negative feelings Outcome: Progressing Denies SI and depression

## 2015-05-14 NOTE — [facility] Group Notes (Signed)
[facility] Group Notes:  (Nursing/MHT/Case Management/Adjunct)  Date:  05/14/2015  Time:  1:17 PM  Type of Therapy:  Group Therapy  Participation Level:  Active  Participation Quality:  Redirectable  Affect:  Appropriate  Cognitive:  Lacking  Insight:  Lacking  Engagement in Group:  Developing/Improving and Engaged  Modes of Intervention:  Activity  Summary of Progress/Problems:  Mario Ortega De'Chelle Keysi Oelkers 05/14/2015, 1:17 PM

## 2015-05-14 NOTE — Progress Notes (Signed)
   05/14/15 1530  Clinical Encounter Type  Visited With Patient  Visit Type Initial;Psychological support;Spiritual support;Behavioral Health  Referral From Nurse  Consult/Referral To Chaplain  Spiritual Encounters  Spiritual Needs Sacred text;Prayer;Emotional;Ritual  Stress Factors  Patient Stress Factors Family relationships;Health changes;Loss of control  Met w/patient and provided pastoral care, confessional, and prayer. Provided pt. w/Bible. [place] Geraci [place]. [place]

## 2015-05-14 NOTE — Progress Notes (Signed)
D: Pt denies SI/HI, Pt is irritable, restless, affect flat and sad upon approach,but after a while brightens up. Patient is  coperative with care and compliant with medication. Patient did not interact with peers this evening.  A: Pt was offered support and encouragement. Pt was given scheduled medications. Pt was encouraged to attend groups. Q 15 minute checks were done for safety.  R:Pt did not attend evening group. Pt is compliant with medication. Safety maintained on unit.

## 2015-05-14 NOTE — Progress Notes (Signed)
Recreation Therapy Notes  Date: 11.11.16 Time: 3:00 pm Location: Craft Room  Group Topic: Communication, Problem Solving, Teamwork  Goal Area(s) Addresses:  Patient will effectively work with peer towards shared goal. Patient will identify skills used to make activity successful. Patient will identify benefit of using group skills effectively post d/c.  Behavioral Response: Arrived late, Attentive, Disruptive  Intervention: Pipe Mudlogger  Activity: Patients were split into 2 groups and instructed to build the tallest free standing tower out of 15 pipe cleaners. Patients were given 2 minutes to determine a strategy. After patient's had been building for approximately 5 minutes, patients were instructed to put their dominant hand behind their back. After approximately 3 minutes of building, patients were instructed to stop talking to their teammates.    Education: LRT educated patients on how communication, problem solving, and teamwork relate to building their healthy support systems.  Education Outcome: In group clarification offered  Clinical Observations/Feedback: Patient arrived to group at approximately 3:35 pm. Patient listened to group discussion. Patient started to get off topic. LRT redirected patient. Patient complied.  Leonette Monarch, LRT/CTRS 05/14/2015 4:48 PM

## 2015-05-14 NOTE — Tx Team (Signed)
Interdisciplinary Treatment Plan Update (Adult)  Date:  05/14/2015 Time Reviewed:  2:39 PM  Progress in Treatment: Attending groups: Yes. Participating in groups:  Yes. Taking medication as prescribed:  Yes. Tolerating medication:  Yes. Family/Significant othe contact made:  No, will contact:  if patient provides cosnent Patient understands diagnosis:  Yes. Discussing patient identified problems/goals with staff:  Yes. Medical problems stabilized or resolved:  Yes. Denies suicidal/homicidal ideation: Yes. Issues/concerns per patient self-inventory:  No. Other:  New problem(s) identified: No, Describe:  none reported  Discharge Plan or Barriers:Patient will stabilize on medication and discharge back to group home. Patient's group home is searching for another group home closer to [place] where patient is originally from and patient could discharge there if something is found by patient's discharge. Patient will need mental health followup and is seen by [facility] ACT.  Reason for Continuation of Hospitalization: Aggression Medication stabilization Other; describe disorganized thought process  Comments:  Estimated length of stay:up to 4 days expected discharge Monday 05/17/15  New goal(s):  Review of initial/current patient goals per problem list:   1.  Goal(s):participate in care planning  Met:  No  Target date:by discharge  2.  Goal (s):decrease disorganized thinking  Met:  No  Target date:by discharge  Attendees: Physician:  Jolanta Pucilowska, MD 11/11/20162:39 PM  Nursing:   Wendy Stringfellow, RN 11/11/20162:39 PM  Other:   , LCSWA 11/11/20162:39 PM  Other:   11/11/20162:39 PM  Other:   11/11/20162:39 PM  Other:  11/11/20162:39 PM  Other:  11/11/20162:39 PM  Other:  11/11/20162:39 PM  Other:  11/11/20162:39 PM  Other:  11/11/20162:39 PM  Other:  11/11/20162:39 PM  Other:   11/11/20162:39 PM   Scribe for Treatment Team:   ,  T, MSW, LCSWA   05/14/2015, 2:39 PM  

## 2015-05-14 NOTE — Plan of Care (Signed)
Problem: Aggression Towards others,Towards Self, and or Destruction Goal: LTG - No aggression,physical/verbal/destruction prior to D/C (Patient will have no episodes of physical or verbal aggression or property destruction towards self or others for _____ day (s) prior to discharge.)  Outcome: Progressing Patient is cooperative with staff, at times irritable but no aggressive behavior towards staff.

## 2015-05-14 NOTE — B[facility] Group Notes (Signed)
[facility] LCSW Aftercare Discharge Planning Group Note   05/14/2015 12:02 PM  Participation Quality:  Active  Mood/Affect:  Appropriate  Thoughts of Suicide:  NA Will you contract for safety?   NA  Current AVH:  NA  Plan for Discharge/Comments: Patient attended and participated in group discussion appropriately. Patient introduced himself and identified his goal to "keep occupied and keep myself out of the dumps." Patient reported that in order to meet his goals, he needs to work on his concentration, fear of socializing with patients, and implementing learned coping skills. Patient's cognition was tangential; however, was easily redirected. Patient plans to return to his previous group home upon discharge.  Transportation Means: Patient reported that group home staff may assist with transportation.  Supports: Patient identified group home staff as a source of support.   Christa See, Clinical Social Work Intern 05/14/15  Carmell Austria, MSW, LCSWA 05/14/15

## 2015-05-15 DIAGNOSIS — F250 Schizoaffective disorder, bipolar type: Principal | ICD-10-CM

## 2015-05-15 LAB — VALPROIC ACID LEVEL: VALPROIC ACID LVL: 77 ug/mL (ref 50.0–100.0)

## 2015-05-15 MED ORDER — DESMOPRESSIN ACE SPRAY REFRIG 0.01 % NA SOLN
2.0000 | Freq: Every day | NASAL | Status: DC
  Administered 2015-05-15 – 2015-05-16 (×2): 2 via NASAL
  Filled 2015-05-15: qty 5

## 2015-05-15 MED ORDER — TEMAZEPAM 15 MG PO CAPS
30.0000 mg | ORAL_CAPSULE | Freq: Every day | ORAL | Status: DC
  Administered 2015-05-15 – 2015-05-16 (×2): 30 mg via ORAL
  Filled 2015-05-15 (×2): qty 2

## 2015-05-15 NOTE — Plan of Care (Signed)
Problem: Aggression Towards others,Towards Self, and or Destruction Goal: STG-Patient will comply with prescribed medication regimen (Patient will comply with prescribed medication regimen)  Outcome: Progressing Medication compliant

## 2015-05-15 NOTE — Progress Notes (Signed)
[facility] MD Progress Note  05/15/2015 2:35 PM Kinzer Horney  MRN:  [id number]  Subjective:  Mr. Turkovich has a history of schizophrenia admitted for agitated behavior in the context of medication noncompliance.  Mr. Glad is greatly improved from how he was when I saw him in the emergency room however he is still disorganized in his thinking and has a hostile tone about him a little bit. He has not been threatening but the nurses note that he still seems to be agitated at times with a somewhat intense look on his face. In conversation with me his only complaint was that he continues to wet the bed at night and has trouble sleeping.  Principal Problem: Schizoaffective disorder, bipolar type ([place]) Diagnosis:   Patient Active Problem List   Diagnosis Date Noted  . Tobacco use disorder [F17.200] 05/13/2015  . MRSA (methicillin resistant Staphylococcus aureus) [A49.02] 05/13/2015  . Schizophrenia ([place]) [F20.9] 05/12/2015  . Schizoaffective disorder, bipolar type ([place]) [F25.0]   . HTN (hypertension) [I10] 05/09/2015  . GERD (gastroesophageal reflux disease) [K21.9] 05/09/2015  . GI bleed [K92.2] 04/07/2015   Total Time spent with patient: 35 minutes  Past Psychiatric History: schizoaffective disorder bipolar type.  Past Medical History:  Past Medical History  Diagnosis Date  . Colostomy in place [facility])   . Hypertension   . Schizo affective schizophrenia ([place])   . GERD (gastroesophageal reflux disease)   . Drug abuse   . MRSA (methicillin resistant staph aureus) culture positive    History reviewed. No pertinent past surgical history. Family History: History reviewed. No pertinent family history. Family Psychiatric  History: nonereported. Social History:  History  Alcohol Use No     History  Drug Use No    Social History   Social History  . Marital Status: Single    Spouse Name: N/A  . Number of Children: N/A  . Years of Education: N/A   Social History Main Topics  . Smoking  status: Current Every Day Smoker  . Smokeless tobacco: None  . Alcohol Use: No  . Drug Use: No  . Sexual Activity: Not Asked   Other Topics Concern  . None   Social History Narrative   Additional Social History:                         Sleep: Poor  Appetite:  Fair  Current Medications: Current Facility-Administered Medications  Medication Dose Route Frequency Provider Last Rate Last Dose  . acetaminophen (TYLENOL) tablet 650 mg  650 mg Oral Q6H PRN Gonzella Lex, MD   650 mg at 05/15/15 1156  . alum & mag hydroxide-simeth (MAALOX/MYLANTA) 200-200-20 MG/5ML suspension 30 mL  30 mL Oral Q4H PRN Gonzella Lex, MD      . amLODipine (NORVASC) tablet 10 mg  10 mg Oral Q breakfast Jolanta B Pucilowska, MD   10 mg at 05/15/15 0842  . cloZAPine (CLOZARIL) tablet 100 mg  100 mg Oral Q breakfast Jolanta B Pucilowska, MD   100 mg at 05/15/15 0842  . cloZAPine (CLOZARIL) tablet 400 mg  400 mg Oral QHS Clovis Fredrickson, MD   400 mg at 05/14/15 2227  . desmopressin (DDAVP) 0.01 % (10 mcg/activation) spray 2 spray  2 spray Nasal QHS Gonzella Lex, MD      . glycopyrrolate (ROBINUL) tablet 1 mg  1 mg Oral TID AC Jolanta B Pucilowska, MD   1 mg at 05/15/15 1156  . magnesium hydroxide (  MILK OF MAGNESIA) suspension 30 mL  30 mL Oral Daily PRN Gonzella Lex, MD      . pantoprazole (PROTONIX) EC tablet 40 mg  40 mg Oral QAC breakfast Gonzella Lex, MD   40 mg at 05/15/15 0841  . temazepam (RESTORIL) capsule 30 mg  30 mg Oral QHS PRN Jolanta B Pucilowska, MD      . temazepam (RESTORIL) capsule 30 mg  30 mg Oral QHS Gonzella Lex, MD      . valproic acid (DEPAKENE) 250 MG capsule 750 mg  750 mg Oral BID AC Jolanta B Pucilowska, MD   750 mg at 05/15/15 0842  . ziprasidone (GEODON) capsule 80 mg  80 mg Oral BID WC Clovis Fredrickson, MD   80 mg at 05/15/15 0841    Lab Results:  Results for orders placed or performed during the hospital encounter of 05/12/15 (from the past 48 hour(s))   CBC with Differential/Platelet     Status: Abnormal   Collection Time: 05/14/15  6:12 PM  Result Value Ref Range   WBC 10.1 3.8 - 10.6 K/uL   RBC 3.50 (L) 4.40 - 5.90 MIL/uL   Hemoglobin 11.4 (L) 13.0 - 18.0 g/dL   HCT 34.8 (L) 40.0 - 52.0 %   MCV 99.5 80.0 - 100.0 fL   MCH 32.6 26.0 - 34.0 pg   MCHC 32.8 32.0 - 36.0 g/dL   RDW 14.5 11.5 - 14.5 %   Platelets 214 150 - 440 K/uL   Neutrophils Relative % 54 %   Neutro Abs 5.5 1.4 - 6.5 K/uL   Lymphocytes Relative 31 %   Lymphs Abs 3.1 1.0 - 3.6 K/uL   Monocytes Relative 9 %   Monocytes Absolute 0.9 0.2 - 1.0 K/uL   Eosinophils Relative 5 %   Eosinophils Absolute 0.5 0 - 0.7 K/uL   Basophils Relative 1 %   Basophils Absolute 0.1 0 - 0.1 K/uL  Valproic acid level     Status: None   Collection Time: 05/14/15  6:12 PM  Result Value Ref Range   Valproic Acid Lvl 77 50.0 - 100.0 ug/mL    Physical Findings: AIMS:  , ,  ,  ,    CIWA:  CIWA-Ar Total: 0 COWS:     Musculoskeletal: Strength & Muscle Tone: within normal limits Gait & Station: normal Patient leans: N/A  Psychiatric Specialty Exam: Review of Systems  Constitutional: Negative.   HENT: Negative.   Eyes: Negative.   Respiratory: Negative.   Cardiovascular: Negative.   Gastrointestinal: Negative.   Genitourinary: Positive for frequency.  Musculoskeletal: Negative.   Skin: Negative.   Neurological: Negative.   Psychiatric/Behavioral: Negative for depression, suicidal ideas, hallucinations, memory loss and substance abuse. The patient is nervous/anxious and has insomnia.   All other systems reviewed and are negative.   Blood pressure 142/105, pulse 118, temperature 98.6 F (37 C), temperature source Oral, resp. rate 20, height 5' 11.5" (1.816 m), weight 69.4 kg (153 lb), SpO2 100 %.Body mass index is 21.04 kg/(m^2).  General Appearance: Casual  Eye Contact::  Fair  Speech:  Clear and Coherent  Volume:  Normal  Mood:  Euthymic  Affect:  Flat  Thought Process:   Disorganized  Orientation:  Full (Time, Place, and Person)  Thought Content:  Delusions, Hallucinations: Auditory and Paranoid Ideation  Suicidal Thoughts:  No  Homicidal Thoughts:  No  Memory:  Immediate;   Fair Recent;   Fair Remote;   Fair  Judgement:  Poor  Insight:  Shallow  Psychomotor Activity:  Normal  Concentration:  Fair  Recall:  [place]  Language: Fair  Akathisia:  No  Handed:  Right  AIMS (if indicated):     Assets:  Communication Skills Desire for Improvement Financial Resources/Insurance Housing Physical Health Resilience  ADL's:  Intact  Cognition: WNL  Sleep:  Number of Hours: 4   Treatment Plan Summary: Daily contact with patient to assess and evaluate symptoms and progress in treatment and Medication management   Mr. Sifuentez is a 49 year old male with a history of schizoaffective disorder admitted for disorganized, agitated, threatening behavior at the group home in the context of medication noncompliance.  1. Agitation. This has resolved  2. Suicidal and homicidal ideation. The patient is able to contract for safety in the hospital.  3. Psychosis. The patient has not been compliant with clozapine at the group home. Clozaril level was only 250. We restarted Clozaril. The patient refused several doses in the emergency room but now agrees to take his medications. Geodon injections were started in the emergency room to address psychosis and partial compliance with clozaril. The patient agreed to take Geodon by mouth. We switched to Geodon 80 mg twice daily.  4. Mood. The patient was started on Depakote. We will check the level on Saturday.  5. GERD. We'll continue Protonix . 6. Hypertension. We'll continue Norvasc.  7. Insomnia. He did not sleep with Trazodone. We will start restoril.  8. Disposition. The patient is allowed to return to his group home although they've been looking for another placement. He will follow-up with his [facility]  act team.  Another problem is his nocturnal enuresis. I have discussed the availability of desmopressin with the pharmacy and we will start desmopressin nasal spray 1 spray each nostril at night to see if that helps with the wetting the bed. I've also started Restoril at night to help with sleep. Continue current medications including clozapine. He seems to be getting gradually much better. Supportive counseling completed. No change to discharge plan. Naomi Fitton 05/15/2015, 2:35 PM

## 2015-05-15 NOTE — Plan of Care (Signed)
Problem: Ineffective individual coping Goal: STG:Pt. will utilize relaxation techniques to reduce stress STG: Patient will utilize relaxation techniques to reduce stress levels  Outcome: Progressing Requesting books or  Do-to-dot pages to help him slow down his racing thoughts.

## 2015-05-15 NOTE — Plan of Care (Signed)
Problem: Ineffective individual coping Goal: LTG: Patient will report a decrease in negative feelings Outcome: Progressing Patient denies SI/HI/AVH.

## 2015-05-15 NOTE — Progress Notes (Signed)
Patient looks tense and angry and pacing halls.  Smiles easily  upon approach and is pleasant.  Thought processes are disorganized.  Fixated on obtaining a blue folder.  When informed that he was given a blue folder on the previous night, stated that he was not given one. Presents frequently to nurses station requesting copy paper and noted to transports magazines to his room   All books and magazines stacked in neat stacks in his room.  Inappropriate comments made at time.  Asked this Probation officer "Can I have a kiss" when informed that was not appropriate states "I'm not going to ever give you anything else."  Intrusive at times.  Support and encouragement offered.  Safety maintained.
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
## 2015-05-15 NOTE — Progress Notes (Signed)
Aryian was pleasant on approach, thought process appeared to be logical on shift,he remains fixated on papers and folders.  He spent most of the evening in the dayroom with peers, he had no behavioral issues on shift. Will continue to monitor's patient behavior.

## 2015-05-15 NOTE — Progress Notes (Signed)
D: Pt denies SI/HI/AVH, affect is blunted, mood is irritable, thoughts, are disorganized , speech is tangential he  appears anxious and apprehensive, pacing back and forth, with some confusion noted to place, frequent redirection used to maintain safety on the unit. Patient is not interacting with peers and staff appropriately. Colostomy bag to RLQ intact stoma is pink, denies pain or discomfort.    A: Pt was offered support and encouragement. Pt was given scheduled medications. Pt was encouraged to attend groups. Q 15 minute checks were done for safety.  R:Pt did not attend group. Pt is taking medication. .Pt receptive to treatment and safety maintained on unit.

## 2015-05-15 NOTE — BHH Group Notes (Signed)
[place] LCSW Group Therapy  05/15/2015 1:03 PM  Type of Therapy:  Group Therapy  Participation Level:  Minimal  Participation Quality:  Attentive  Affect:  Flat  Cognitive:  Disorganized  Insight:  Limited  Engagement in Therapy:  Limited  Modes of Intervention:  Discussion, Education, Socialization and Support  Summary of Progress/Problems: Pt will identify unhealthy thoughts and how they impact their emotions and behavior. Pt will be encouraged to discuss these thoughts, emotions and behaviors with the group. He attended group and stayed the entire time. He discussed how important his faith is to him. He was disorganized but pleasant.   [place] MSW, LCSWA  05/15/2015, 1:03 PM

## 2015-05-16 MED ORDER — CLOZAPINE 25 MG PO TABS
450.0000 mg | ORAL_TABLET | Freq: Every day | ORAL | Status: DC
  Administered 2015-05-16: 450 mg via ORAL
  Filled 2015-05-16 (×3): qty 2

## 2015-05-16 MED ORDER — DESMOPRESSIN ACE SPRAY REFRIG 0.01 % NA SOLN: 2.0000 | Freq: Every day | NASAL | Status: DC

## 2015-05-16 MED ORDER — PANTOPRAZOLE SODIUM 40 MG PO TBEC: 40.0000 mg | DELAYED_RELEASE_TABLET | Freq: Every day | ORAL | Status: DC

## 2015-05-16 MED ORDER — ZIPRASIDONE HCL 80 MG PO CAPS: 80.0000 mg | ORAL_CAPSULE | Freq: Two times a day (BID) | ORAL | Status: DC

## 2015-05-16 MED ORDER — TEMAZEPAM 30 MG PO CAPS: 30.0000 mg | ORAL_CAPSULE | Freq: Every evening | ORAL | Status: DC | PRN

## 2015-05-16 NOTE — Progress Notes (Signed)
[facility] MD Progress Note  05/16/2015 1:28 PM Mario Ortega  MRN:  [id number]  Subjective:  Mario Ortega has a history of schizophrenia admitted for agitated behavior in the context of medication noncompliance.  Follow-up interview today on Sunday the 13th. Patient is interactive on the unit and does not appear to be intrusive or causing problems. He is still somewhat odd and aggressive in conversation with me. He tells me that he wants me to increase his medicine because he still couldn't sleep at night. This is despite adding 30 mg of Restoril at night. He does also tell me that he did not wet the bed last night. Principal Problem: Schizoaffective disorder, bipolar type ([place]) Diagnosis:   Patient Active Problem List   Diagnosis Date Noted  . Tobacco use disorder [F17.200] 05/13/2015  . MRSA (methicillin resistant Staphylococcus aureus) [A49.02] 05/13/2015  . Schizophrenia ([place]) [F20.9] 05/12/2015  . Schizoaffective disorder, bipolar type ([place]) [F25.0]   . HTN (hypertension) [I10] 05/09/2015  . GERD (gastroesophageal reflux disease) [K21.9] 05/09/2015  . GI bleed [K92.2] 04/07/2015   Total Time spent with patient: 35 minutes  Past Psychiatric History: schizoaffective disorder bipolar type.  Past Medical History:  Past Medical History  Diagnosis Date  . Colostomy in place [facility])   . Hypertension   . Schizo affective schizophrenia ([place])   . GERD (gastroesophageal reflux disease)   . Drug abuse   . MRSA (methicillin resistant staph aureus) culture positive    History reviewed. No pertinent past surgical history. Family History: History reviewed. No pertinent family history. Family Psychiatric  History: nonereported. Social History:  History  Alcohol Use No     History  Drug Use No    Social History   Social History  . Marital Status: Single    Spouse Name: N/A  . Number of Children: N/A  . Years of Education: N/A   Social History Main Topics  . Smoking status: Current  Every Day Smoker  . Smokeless tobacco: None  . Alcohol Use: No  . Drug Use: No  . Sexual Activity: Not Asked   Other Topics Concern  . None   Social History Narrative   Additional Social History:                         Sleep: Poor  Appetite:  Fair  Current Medications: Current Facility-Administered Medications  Medication Dose Route Frequency Provider Last Rate Last Dose  . acetaminophen (TYLENOL) tablet 650 mg  650 mg Oral Q6H PRN Gonzella Lex, MD   650 mg at 05/15/15 1156  . alum & mag hydroxide-simeth (MAALOX/MYLANTA) 200-200-20 MG/5ML suspension 30 mL  30 mL Oral Q4H PRN Gonzella Lex, MD      . amLODipine (NORVASC) tablet 10 mg  10 mg Oral Q breakfast Jolanta B Pucilowska, MD   10 mg at 05/16/15 0753  . cloZAPine (CLOZARIL) tablet 100 mg  100 mg Oral Q breakfast Jolanta B Pucilowska, MD   100 mg at 05/16/15 0752  . cloZAPine (CLOZARIL) tablet 450 mg  450 mg Oral QHS Gonzella Lex, MD      . desmopressin (DDAVP) 0.01 % (10 mcg/activation) spray 2 spray  2 spray Nasal QHS Gonzella Lex, MD   2 spray at 05/15/15 2156  . glycopyrrolate (ROBINUL) tablet 1 mg  1 mg Oral TID AC Jolanta B Pucilowska, MD   1 mg at 05/16/15 1151  . magnesium hydroxide (MILK OF MAGNESIA) suspension 30 mL  30 mL Oral Daily PRN Gonzella Lex, MD      . pantoprazole (PROTONIX) EC tablet 40 mg  40 mg Oral QAC breakfast Gonzella Lex, MD   40 mg at 05/16/15 0753  . temazepam (RESTORIL) capsule 30 mg  30 mg Oral QHS PRN Jolanta B Pucilowska, MD      . temazepam (RESTORIL) capsule 30 mg  30 mg Oral QHS Gonzella Lex, MD   30 mg at 05/15/15 2155  . valproic acid (DEPAKENE) 250 MG capsule 750 mg  750 mg Oral BID AC Jolanta B Pucilowska, MD   750 mg at 05/16/15 0753  . ziprasidone (GEODON) capsule 80 mg  80 mg Oral BID WC Clovis Fredrickson, MD   80 mg at 05/16/15 [id number]    Lab Results:  Results for orders placed or performed during the hospital encounter of 05/12/15 (from the past 48 hour(s))   CBC with Differential/Platelet     Status: Abnormal   Collection Time: 05/14/15  6:12 PM  Result Value Ref Range   WBC 10.1 3.8 - 10.6 K/uL   RBC 3.50 (L) 4.40 - 5.90 MIL/uL   Hemoglobin 11.4 (L) 13.0 - 18.0 g/dL   HCT 34.8 (L) 40.0 - 52.0 %   MCV 99.5 80.0 - 100.0 fL   MCH 32.6 26.0 - 34.0 pg   MCHC 32.8 32.0 - 36.0 g/dL   RDW 14.5 11.5 - 14.5 %   Platelets 214 150 - 440 K/uL   Neutrophils Relative % 54 %   Neutro Abs 5.5 1.4 - 6.5 K/uL   Lymphocytes Relative 31 %   Lymphs Abs 3.1 1.0 - 3.6 K/uL   Monocytes Relative 9 %   Monocytes Absolute 0.9 0.2 - 1.0 K/uL   Eosinophils Relative 5 %   Eosinophils Absolute 0.5 0 - 0.7 K/uL   Basophils Relative 1 %   Basophils Absolute 0.1 0 - 0.1 K/uL  Valproic acid level     Status: None   Collection Time: 05/14/15  6:12 PM  Result Value Ref Range   Valproic Acid Lvl 77 50.0 - 100.0 ug/mL    Physical Findings: AIMS:  , ,  ,  ,    CIWA:  CIWA-Ar Total: 0 COWS:     Musculoskeletal: Strength & Muscle Tone: within normal limits Gait & Station: normal Patient leans: N/A  Psychiatric Specialty Exam: Review of Systems  Constitutional: Negative.   HENT: Negative.   Eyes: Negative.   Respiratory: Negative.   Cardiovascular: Negative.   Gastrointestinal: Negative.   Genitourinary: Positive for frequency.  Musculoskeletal: Negative.   Skin: Negative.   Neurological: Negative.   Psychiatric/Behavioral: Negative for depression, suicidal ideas, hallucinations, memory loss and substance abuse. The patient is nervous/anxious and has insomnia.   All other systems reviewed and are negative.   Blood pressure 130/83, pulse 93, temperature 97.6 F (36.4 C), temperature source Oral, resp. rate 20, height 5' 11.5" (1.816 m), weight 69.4 kg (153 lb), SpO2 100 %.Body mass index is 21.04 kg/(m^2).  General Appearance: Casual  Eye Contact::  Fair  Speech:  Clear and Coherent  Volume:  Normal  Mood:  Euthymic  Affect:  Flat  Thought Process:   Disorganized  Orientation:  Full (Time, Place, and Person)  Thought Content:  Delusions, Hallucinations: Auditory and Paranoid Ideation  Suicidal Thoughts:  No  Homicidal Thoughts:  No  Memory:  Immediate;   Fair Recent;   Fair Remote;   Fair  Judgement:  Poor  Insight:  Shallow  Psychomotor Activity:  Normal  Concentration:  Fair  Recall:  [place]  Language: Fair  Akathisia:  No  Handed:  Right  AIMS (if indicated):     Assets:  Communication Skills Desire for Improvement Financial Resources/Insurance Housing Physical Health Resilience  ADL's:  Intact  Cognition: WNL  Sleep:  Number of Hours: 6.5   Treatment Plan Summary: Daily contact with patient to assess and evaluate symptoms and progress in treatment and Medication management   Mario Ortega is a 49 year old male with a history of schizoaffective disorder admitted for disorganized, agitated, threatening behavior at the group home in the context of medication noncompliance.  1. Agitation. This has resolved  2. Suicidal and homicidal ideation. The patient is able to contract for safety in the hospital.  3. Psychosis. The patient has not been compliant with clozapine at the group home. Clozaril level was only 250. We restarted Clozaril. The patient refused several doses in the emergency room but now agrees to take his medications. Geodon injections were started in the emergency room to address psychosis and partial compliance with clozaril. The patient agreed to take Geodon by mouth. We switched to Geodon 80 mg twice daily.  4. Mood. The patient was started on Depakote. We will check the level on Saturday.  5. GERD. We'll continue Protonix . 6. Hypertension. We'll continue Norvasc.  7. Insomnia. He did not sleep with Trazodone. We will start restoril.  8. Disposition. The patient is allowed to return to his group home although they've been looking for another placement. He will [facility] with [facility]  [facility]. Continue the DDAVP for his nocturnal enuresis. Continue the sleeping medicine. Increase clozapine to 450 mg at night. Supportive counseling. Plan to check sodium tomorrow morning to make sure he is not getting hyponatremic. Michelle Vanhise 05/16/2015, 1:28 PM

## 2015-05-16 NOTE — Progress Notes (Signed)
D: patient presenting with a flat affect and can be very liable at times.  Patient denies any SI or HI at this time.  Patient compliant with medications.  Patient denies any auditory or visual hallucinations at this time but seems to be having delusions and bizarre thinking at times. Patient in no distress at this time.  A: support and encouragement provided.  Medications given as prescribed.  q 15 min checks done R: patient not receptive of information given and needs re teaching and reinforcing
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
## 2015-05-16 NOTE — Plan of Care (Signed)
Problem: Ineffective individual coping Goal: LTG: Patient will report a decrease in negative feelings Outcome: Progressing Patient has seemed a little irritable today but reports a decrease in depression and any negative feelings

## 2015-05-17 NOTE — Progress Notes (Signed)
D: pt aware of discharge this shift, pt denies suicidal ideation or homicidal ideation, pt calm and cooperative, no distress noted  A: all personal items in locker returned to patient, instructions given on discharge information, received prescriptions and follow up appointment  R: patient states he will comply with outpatient services and medications as prescribed, patient left with group home

## 2015-05-17 NOTE — Progress Notes (Signed)
  [facility] Adult Case Management Discharge Plan :  Will you be returning to the same living situation after discharge:  Yes,  back to group home [facility] At discharge, do you have transportation home?: Yes,  group home will pick up Do you have the ability to pay for your medications: Yes,  patient has insurance  Release of information consent forms completed and in the chart;  Patient's signature needed at discharge.  Patient to Follow up at: Follow-up Information    Follow up with [facility]  On 05/18/2015.   Why:  For follow-up care ACT will visit patient at the group home on Tuesday 05/18/15   Contact information:   [address] [place], [place] Ph [phone] Fax [phone]       Next level of care provider has access to [place]  Patient denies SI/HI: Yes,  patient denies SI/HI    Safety Planning and Suicide Prevention discussed: Yes,  SPE discussed with paitent and Arlie Solomons (group home owner) [phone]  Have you used any form of tobacco in the last 30 days? (Cigarettes, Smokeless Tobacco, Cigars, and/or Pipes): Yes  Has patient been referred to the Quitline?: Patient refused referral  Keene Breath, MSW, LCSWA 05/17/2015, 12:36 PM

## 2015-05-17 NOTE — BHH Suicide Risk Assessment (Signed)
[place] INPATIENT:  Family/Significant Other Suicide Prevention Education  Suicide Prevention Education:  Education Completed; Arlie Solomons (group home owner) [phone] has been identified by the patient as the family member/significant other with whom the patient will be residing, and identified as the person(s) who will aid the patient in the event of a mental health crisis (suicidal ideations/suicide attempt).  With written consent from the patient, the family member/significant other has been provided the following suicide prevention education, prior to the and/or following the discharge of the patient.  The suicide prevention education provided includes the following:  Suicide risk factors  Suicide prevention and interventions  National Suicide Hotline telephone number  [facility] assessment telephone number  [facility] Emergency Assistance [place] and/or Residential Mobile Crisis Unit telephone number  Request made of family/significant other to:  Remove weapons (e.g., guns, rifles, knives), all items previously/currently identified as safety concern.    Remove drugs/medications (over-the-counter, prescriptions, illicit drugs), all items previously/currently identified as a safety concern.  The family member/significant other verbalizes understanding of the suicide prevention education information provided.  The family member/significant other agrees to remove the items of safety concern listed above.  Keene Breath, MSW, LCSWA 05/17/2015, 12:35 PM

## 2015-05-17 NOTE — Progress Notes (Signed)
Recreation Therapy Notes  INPATIENT RECREATION TR PLAN  Patient Details Name: Mario Ortega MRN: [id number] DOB: 08/24/1965 Today's Date: 05/17/2015  Rec Therapy Plan Is patient appropriate for Therapeutic Recreation?: Yes Treatment times per week: At least once a week TR Treatment/Interventions: 1:1 session, Group participation (Comment) (Appropriate participation in daily recreational therapy tx)  Discharge Criteria Pt will be discharged from therapy if:: Treatment goals are met, Discharged Treatment plan/goals/alternatives discussed and agreed upon by:: Patient/family  Discharge Summary Short term goals set: See Care Plan Short term goals met: Adequate for discharge Progress toward goals comments: One-to-one attended Which groups?: Coping skills, Communication, Other (Comment) (Self-expression, problem solving, teamwork) One-to-one attended: Anger Management Reason goals not met: Patient refused Therapeutic equipment acquired: None Reason patient discharged from therapy: Discharge from hospital Pt/family agrees with progress & goals achieved: Yes Date patient discharged from therapy: 05/17/15   , M, LRT/CTRS 05/17/2015, 4:51 PM  

## 2015-05-17 NOTE — Plan of Care (Signed)
Problem: [facility] Participation in Recreation Therapeutic Interventions Goal: STG-Patient will identify at least five coping skills for ** STG: Coping Skills - Within 4 treatment sessions, patient will verbalize at least 5 coping skills for anger in each of 2 treatment sessions to increase anger management skills post d/c.  Outcome: Adequate for Discharge Treatment Session 2; Completed 1 out of 2: At approximately 1:00 pm, LRT attempted treatment session. Patient refused.   Leonette Monarch, LRT/CTRS 11.14.16 4:49 pm

## 2015-05-17 NOTE — Progress Notes (Signed)
D: Patient denies SI/HI/AVH.  Patient is irritable and anxious.  Patient did attend evening group. Patient visible on the milieu. Patient mixed all the games up in the day room and tore up the boxes of some of them.   A: Support and encouragement offered. Scheduled medications given to pt. Q 15 min checks continued for patient safety. R: Patient receptive. Patient remains safe on the unit.

## 2015-05-17 NOTE — BHH Suicide Risk Assessment (Signed)
[facility] Discharge Suicide Risk Assessment   Demographic Factors:  Male  Total Time spent with patient: 30 minutes  Musculoskeletal: Strength & Muscle Tone: within normal limits Gait & Station: normal Patient leans: N/A  Psychiatric Specialty Exam: Physical Exam  Nursing note and vitals reviewed.   Review of Systems  All other systems reviewed and are negative.   Blood pressure 153/96, pulse 93, temperature 97.6 F (36.4 C), temperature source Oral, resp. rate 20, height 5' 11.5" (1.816 m), weight 69.4 kg (153 lb), SpO2 100 %.Body mass index is 21.04 kg/(m^2).  General Appearance: Casual  Eye Contact::  Good  Speech:  Clear and [id number]  Volume:  Normal  Mood:  Euthymic  Affect:  Appropriate  Thought Process:  Goal Directed  Orientation:  Full (Time, Place, and Person)  Thought Content:  WDL  Suicidal Thoughts:  No  Homicidal Thoughts:  No  Memory:  Immediate;   Fair Recent;   Fair Remote;   Fair  Judgement:  Fair  Insight:  Fair  Psychomotor Activity:  Normal  Concentration:  Fair  Recall:  [employer]  Language: Fair  Akathisia:  No  Handed:  Right  AIMS (if indicated):     Assets:  Communication Skills Desire for Improvement Financial Resources/Insurance Housing Resilience  Sleep:  Number of Hours: 5.5  Cognition: WNL  ADL's:  Intact   Have you used any form of tobacco in the last 30 days? (Cigarettes, Smokeless Tobacco, Cigars, and/or Pipes): Yes  Has this patient used any form of tobacco in the last 30 days? (Cigarettes, Smokeless Tobacco, Cigars, and/or Pipes) Yes, A prescription for an FDA-approved tobacco cessation medication was offered at discharge and the patient refused  Mental Status Per Nursing Assessment::   On Admission:     Current Mental Status by Physician: NA  Loss Factors: NA  Historical Factors: Impulsivity  Risk Reduction Factors:   Sense of responsibility to family and Living with another person, especially  a relative  Continued Clinical Symptoms:  Schizophrenia:   Paranoid or undifferentiated type  Cognitive Features That Contribute To Risk:  None    Suicide Risk:  Minimal: No identifiable suicidal ideation.  Patients presenting with no risk factors but with morbid ruminations; may be classified as minimal risk based on the severity of the depressive symptoms  Principal Problem: Schizoaffective disorder, bipolar type [facility]) Discharge Diagnoses:  Patient Active Problem List   Diagnosis Date Noted  . Tobacco use disorder [F17.200] 05/13/2015  . MRSA (methicillin resistant Staphylococcus aureus) [A49.02] 05/13/2015  . Schizophrenia ([place]) [F20.9] 05/12/2015  . Schizoaffective disorder, bipolar type ([place]) [F25.0]   . HTN (hypertension) [I10] 05/09/2015  . GERD (gastroesophageal reflux disease) [K21.9] 05/09/2015  . GI bleed [K92.2] 04/07/2015      Plan Of Care/Follow-up recommendations:  Activity:  As tolerated. Diet:  Low sodium heart healthy. Other:  Keep follow-up appointments.  Is patient on multiple antipsychotic therapies at discharge:  Yes,   Do you recommend tapering to monotherapy for antipsychotics?  No   Has Patient had three or more failed trials of antipsychotic monotherapy by history:  Yes,   Antipsychotic medications that previously failed include:   1.  Haldol., 2.  Zyprexa. and 3.  Clozaril.  Recommended Plan for Multiple Antipsychotic Therapies: Second antipsychotic is Clozapine.  Reason for adding Clozapine Incomplete response to Clozaril.    Mario Ortega 05/17/2015, 11:03 AM

## 2015-05-17 NOTE — BHH Group Notes (Signed)
[facility] Group Notes:  (Nursing/MHT/Case Management/Adjunct)  Date:  05/17/2015  Time:  1:36 PM  Type of Therapy:  Psychoeducational Skills  Participation Level:  None  Participation Quality:  Drowsy  Affect:  Not Congruent  Cognitive:  Lacking  Insight:  None  Engagement in Group:  Off Topic  Modes of Intervention:  Discussion and Education  Summary of Progress/Problems:  [name] 05/17/2015, 1:36 PM

## 2015-05-17 NOTE — BHH Group Notes (Signed)
[place] Group Notes:  (Nursing/MHT/Case Management/Adjunct)  Date:  05/17/2015  Time:  1:09 AM  Type of Therapy:  Group Therapy  Participation Level:  Active  Participation Quality:  Redirectable  Affect:  Irritable  Cognitive:  Disorganized  Insight:  Limited  Engagement in Group:  Off Topic  Modes of Intervention:  Discussion  Summary of Progress/Problems: Pt talked about his family seemingly getting progressively upset. Staff attempted to follow along as best as possible. Staff was able to redirect PT he eventually calmed down.   Jenetta Downer Saidee Geremia 05/17/2015, 1:09 AM
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
## 2015-05-17 NOTE — Progress Notes (Signed)
   05/17/15 1000  Clinical Encounter Type  Visited With Patient  Visit Type Follow-up  Referral From Patient  Consult/Referral To Chaplain  Spiritual Encounters  Spiritual Needs Emotional  Stress Factors  Patient Stress Factors Family relationships  Met w/patient to follow up on emotional stability. Patient expressed positive emotional and spiritual attitude. He was visiting with others and appeared well adjusted. Chap. Asif Muchow G. [place]

## 2015-09-16 DIAGNOSIS — K631 Perforation of intestine (nontraumatic): Secondary | ICD-10-CM | POA: Diagnosis not present

## 2016-02-02 DIAGNOSIS — F250 Schizoaffective disorder, bipolar type: Secondary | ICD-10-CM | POA: Diagnosis not present

## 2016-02-08 DIAGNOSIS — F250 Schizoaffective disorder, bipolar type: Secondary | ICD-10-CM | POA: Diagnosis not present

## 2016-02-08 DIAGNOSIS — Z79899 Other long term (current) drug therapy: Secondary | ICD-10-CM | POA: Diagnosis not present

## 2016-02-15 DIAGNOSIS — I10 Essential (primary) hypertension: Secondary | ICD-10-CM | POA: Diagnosis not present

## 2016-02-15 DIAGNOSIS — D649 Anemia, unspecified: Secondary | ICD-10-CM | POA: Diagnosis not present

## 2016-02-15 DIAGNOSIS — Z933 Colostomy status: Secondary | ICD-10-CM | POA: Diagnosis not present

## 2016-02-15 DIAGNOSIS — K219 Gastro-esophageal reflux disease without esophagitis: Secondary | ICD-10-CM | POA: Diagnosis not present

## 2016-02-21 ENCOUNTER — Encounter (HOSPITAL_COMMUNITY): Payer: Self-pay | Admitting: Emergency Medicine

## 2016-02-21 ENCOUNTER — Emergency Department (HOSPITAL_COMMUNITY)
Admission: EM | Admit: 2016-02-21 | Discharge: 2016-02-22 | Disposition: A | Discharge status: Home / Self Care | Payer: Medicare Other | Attending: Emergency Medicine | Admitting: Emergency Medicine

## 2016-02-21 DIAGNOSIS — Z79899 Other long term (current) drug therapy: Secondary | ICD-10-CM | POA: Insufficient documentation

## 2016-02-21 DIAGNOSIS — F068 Other specified mental disorders due to known physiological condition: Secondary | ICD-10-CM | POA: Diagnosis not present

## 2016-02-21 DIAGNOSIS — F17200 Nicotine dependence, unspecified, uncomplicated: Secondary | ICD-10-CM | POA: Diagnosis not present

## 2016-02-21 DIAGNOSIS — F259 Schizoaffective disorder, unspecified: Secondary | ICD-10-CM | POA: Diagnosis not present

## 2016-02-21 DIAGNOSIS — I10 Essential (primary) hypertension: Secondary | ICD-10-CM | POA: Insufficient documentation

## 2016-02-21 DIAGNOSIS — Z008 Encounter for other general examination: Secondary | ICD-10-CM | POA: Diagnosis present

## 2016-02-21 DIAGNOSIS — F23 Brief psychotic disorder: Secondary | ICD-10-CM | POA: Diagnosis not present

## 2016-02-21 LAB — CBC
HCT: 39.6 % (ref 39.0–52.0)
Hemoglobin: 12.8 g/dL — ABNORMAL LOW (ref 13.0–17.0)
MCH: 33.2 pg (ref 26.0–34.0)
MCHC: 32.3 g/dL (ref 30.0–36.0)
MCV: 102.6 fL — ABNORMAL HIGH (ref 78.0–100.0)
PLATELETS: 53 10*3/uL — AB (ref 150–400)
RBC: 3.86 MIL/uL — ABNORMAL LOW (ref 4.22–5.81)
RDW: 15 % (ref 11.5–15.5)
WBC: 8.1 10*3/uL (ref 4.0–10.5)

## 2016-02-21 LAB — BASIC METABOLIC PANEL
Anion gap: 2 — ABNORMAL LOW (ref 5–15)
BUN: 29 mg/dL — AB (ref 6–20)
CO2: 26 mmol/L (ref 22–32)
CREATININE: 1.48 mg/dL — AB (ref 0.61–1.24)
Calcium: 9.7 mg/dL (ref 8.9–10.3)
Chloride: 112 mmol/L — ABNORMAL HIGH (ref 101–111)
GFR calc Af Amer: >60 mL/min (ref >60)
GFR, EST NON AFRICAN AMERICAN: 54 mL/min — AB (ref >60)
Glucose, Bld: 91 mg/dL (ref 65–99)
Potassium: 4 mmol/L (ref 3.5–5.1)
SODIUM: 140 mmol/L (ref 135–145)

## 2016-02-21 LAB — URINALYSIS, ROUTINE W REFLEX MICROSCOPIC
BILIRUBIN URINE: NEGATIVE
GLUCOSE, UA: NEGATIVE mg/dL
HGB URINE DIPSTICK: NEGATIVE
Ketones, ur: NEGATIVE mg/dL
Leukocytes, UA: NEGATIVE
Nitrite: NEGATIVE
PROTEIN: NEGATIVE mg/dL
SPECIFIC GRAVITY, URINE: 1.01 (ref 1.005–1.030)
pH: 6 (ref 5.0–8.0)

## 2016-02-21 LAB — RAPID URINE DRUG SCREEN, HOSP PERFORMED
Amphetamines: NOT DETECTED
Barbiturates: NOT DETECTED
Benzodiazepines: NOT DETECTED
Cocaine: NOT DETECTED
OPIATES: NOT DETECTED
TETRAHYDROCANNABINOL: NOT DETECTED

## 2016-02-21 LAB — ETHANOL

## 2016-02-21 MED ORDER — CLOZAPINE 100 MG PO TABS
ORAL_TABLET | ORAL | Status: AC
  Filled 2016-02-21: qty 1

## 2016-02-21 MED ORDER — CLOZAPINE 25 MG PO TABS
50.0000 mg | ORAL_TABLET | ORAL | Status: DC
  Administered 2016-02-22: 50 mg via ORAL
  Filled 2016-02-21 (×2): qty 2

## 2016-02-21 MED ORDER — ADULT MULTIVITAMIN W/MINERALS CH
1.0000 | ORAL_TABLET | Freq: Every day | ORAL | Status: DC
  Administered 2016-02-22: 1 via ORAL
  Filled 2016-02-21: qty 1

## 2016-02-21 MED ORDER — CLOZAPINE 100 MG PO TABS
ORAL_TABLET | ORAL | Status: AC
  Filled 2016-02-21: qty 4

## 2016-02-21 MED ORDER — PANTOPRAZOLE SODIUM 40 MG PO TBEC
40.0000 mg | DELAYED_RELEASE_TABLET | Freq: Every day | ORAL | Status: DC
  Administered 2016-02-22: 40 mg via ORAL
  Filled 2016-02-21: qty 1

## 2016-02-21 MED ORDER — CLOZAPINE 100 MG PO TABS
400.0000 mg | ORAL_TABLET | Freq: Every day | ORAL | Status: DC
  Administered 2016-02-22: 400 mg via ORAL
  Filled 2016-02-21 (×2): qty 4

## 2016-02-21 MED ORDER — GLYCOPYRROLATE 1 MG PO TABS
1.0000 mg | ORAL_TABLET | Freq: Three times a day (TID) | ORAL | Status: DC
  Administered 2016-02-22 (×2): 1 mg via ORAL
  Filled 2016-02-21 (×5): qty 1

## 2016-02-21 MED ORDER — TEMAZEPAM 15 MG PO CAPS
30.0000 mg | ORAL_CAPSULE | Freq: Every evening | ORAL | Status: DC | PRN
  Administered 2016-02-22: 30 mg via ORAL
  Filled 2016-02-21: qty 2

## 2016-02-21 MED ORDER — ZIPRASIDONE HCL 80 MG PO CAPS
80.0000 mg | ORAL_CAPSULE | Freq: Two times a day (BID) | ORAL | Status: DC
  Administered 2016-02-22: 80 mg via ORAL
  Filled 2016-02-21 (×3): qty 1

## 2016-02-21 MED ORDER — AMLODIPINE BESYLATE 5 MG PO TABS
10.0000 mg | ORAL_TABLET | Freq: Every day | ORAL | Status: DC
  Administered 2016-02-22: 10 mg via ORAL
  Filled 2016-02-21: qty 2

## 2016-02-21 MED ORDER — VALPROIC ACID 250 MG PO CAPS
750.0000 mg | ORAL_CAPSULE | Freq: Two times a day (BID) | ORAL | Status: DC
  Administered 2016-02-22 (×2): 750 mg via ORAL
  Filled 2016-02-21 (×4): qty 3

## 2016-02-21 MED ORDER — DESMOPRESSIN ACE SPRAY REFRIG 0.01 % NA SOLN
2.0000 | Freq: Every day | NASAL | Status: DC
  Filled 2016-02-21: qty 5

## 2016-02-21 NOTE — ED Notes (Signed)
Pt wandering around room. Pt refusing to get back in bed or for RN to placed fall risk socks. pt emptied colostomy bag and removed bag from stoma. Materials aware and bringing replacement bag.

## 2016-02-21 NOTE — ED Notes (Addendum)
Lab at bedside.   Pt wandering out to nurses station. Pt reports I need fresh air. Pt placed in wheelchair at nurses station.

## 2016-02-21 NOTE — ED Notes (Signed)
Pt's belongings placed in locker.

## 2016-02-21 NOTE — ED Notes (Signed)
EDP assessment.

## 2016-02-21 NOTE — ED Notes (Addendum)
Pt resting with eyes opened, appears to be in no distress. Respirations are even and unlabored. Safety sitter in sight.

## 2016-02-21 NOTE — ED Notes (Addendum)
Pt returned to nurses station via wheelchair. Pt alert and cooperative. Pt able to have conversation and making coherent statements.   Pt occasional has to be redirected back to wheelchair.

## 2016-02-21 NOTE — ED Provider Notes (Signed)
[place] DEPT Provider Note   CSN: [id number] Arrival date & time: 02/21/16  1338     History   Chief Complaint Chief Complaint  Patient presents with  . Altered Mental Status    HPI Mario Ortega is a 50 y.o. male.  Patient sent over from assisted living because he was more confused than normal. Patient has a history of schizophrenia   The history is provided by the nursing home. No language interpreter was used.  Altered Mental Status   This is a recurrent problem. The current episode started 12 to 24 hours ago. The problem has not changed since onset.Associated symptoms include confusion. His past medical history does not include seizures.    Past Medical History:  Diagnosis Date  . Colostomy in place [facility])   . Drug abuse   . GERD (gastroesophageal reflux disease)   . Hypertension   . MRSA (methicillin resistant staph aureus) culture positive   . Schizo affective schizophrenia [facility])     Patient Active Problem List   Diagnosis Date Noted  . Tobacco use disorder 05/13/2015  . MRSA (methicillin resistant Staphylococcus aureus) 05/13/2015  . Schizophrenia ([place]) 05/12/2015  . Schizoaffective disorder, bipolar type ([place])   . HTN (hypertension) 05/09/2015  . GERD (gastroesophageal reflux disease) 05/09/2015  . GI bleed 04/07/2015    History reviewed. No pertinent surgical history.     Home Medications    Prior to Admission medications   Medication Sig Start Date End Date Taking? Authorizing Provider  amLODipine (NORVASC) 10 MG tablet Take 10 mg by mouth daily.    Historical Provider, MD  clozapine (CLOZARIL) 200 MG tablet Take 400 mg by mouth at bedtime. *note dose*    Historical Provider, MD  cloZAPine (CLOZARIL) 25 MG tablet Take 50 mg by mouth every morning. *note dose*    Historical Provider, MD  desmopressin (DDAVP) 0.01 % SOLN Place 2 sprays into the nose at bedtime. 05/16/15   Clovis Fredrickson, MD  glycopyrrolate (ROBINUL) 1 MG tablet Take 1  mg by mouth 3 (three) times daily.    Historical Provider, MD  Multiple Vitamins-Minerals (THEREMS-M) TABS Take 1 tablet by mouth daily.    Historical Provider, MD  pantoprazole (PROTONIX) 40 MG tablet Take 1 tablet (40 mg total) by mouth daily before breakfast. 05/16/15   Jolanta B Pucilowska, MD  temazepam (RESTORIL) 30 MG capsule Take 1 capsule (30 mg total) by mouth at bedtime as needed for sleep. 05/16/15   Jolanta B Pucilowska, MD  valproic acid (DEPAKENE) 250 MG capsule Take 3 capsules (750 mg total) by mouth 2 (two) times daily. 04/09/15   Loletha Grayer, MD  ziprasidone (GEODON) 80 MG capsule Take 1 capsule (80 mg total) by mouth 2 (two) times daily with a meal. 05/16/15   Clovis Fredrickson, MD    Family History History reviewed. No pertinent family history.  Social History Social History  Substance Use Topics  . Smoking status: Current Every Day Smoker  . Smokeless tobacco: Never Used  . Alcohol use No     Allergies   Penicillins   Review of Systems Review of Systems  Unable to perform ROS: Mental status change  Psychiatric/Behavioral: Positive for confusion.     Physical Exam Updated Vital Signs BP (!) 159/105 (BP Location: Left Arm) Comment: pt moving arm at time of vital signs being obtained.  Pulse 65   Temp 97.8 F (36.6 C) (Oral)   Resp 18   Ht 5\' 11"  (1.803 m)  Wt 150 lb (68 kg)   SpO2 100%   BMI 20.92 kg/m   Physical Exam  Constitutional: He appears well-developed.  HENT:  Head: Normocephalic.  Eyes: Conjunctivae and EOM are normal. No scleral icterus.  Neck: Neck supple. No thyromegaly present.  Cardiovascular: Normal rate and regular rhythm.  Exam reveals no gallop and no friction rub.   No murmur heard. Pulmonary/Chest: No stridor. He has no wheezes. He has no rales. He exhibits no tenderness.  Abdominal: He exhibits no distension. There is no tenderness. There is no rebound.  Musculoskeletal: Normal range of motion. He exhibits no edema.   Lymphadenopathy:    He has no cervical adenopathy.  Neurological: He is alert. He exhibits normal muscle tone. Coordination normal.  Skin: No rash noted. No erythema.  Psychiatric:  Patient will not answer questions appropriately. Patient having flight of ideas.     ED Treatments / Results  Labs (all labs ordered are listed, but only abnormal results are displayed) Labs Reviewed  CBC - Abnormal; Notable for the following:       Result Value   RBC 3.86 (*)    Hemoglobin 12.8 (*)    MCV 102.6 (*)    Platelets 53 (*)    All other components within normal limits  BASIC METABOLIC PANEL - Abnormal; Notable for the following:    Chloride 112 (*)    BUN 29 (*)    Creatinine, Ser 1.48 (*)    GFR calc non Af Amer 54 (*)    Anion gap 2 (*)    All other components within normal limits  URINALYSIS, ROUTINE W REFLEX MICROSCOPIC (NOT AT [facility])  URINE RAPID DRUG SCREEN, HOSP PERFORMED  ETHANOL    EKG  EKG Interpretation None       Radiology No results found.  Procedures Procedures (including critical care time)  Medications Ordered in ED Medications  amLODipine (NORVASC) tablet 10 mg (not administered)  cloZAPine (CLOZARIL) tablet 400 mg (not administered)  cloZAPine (CLOZARIL) tablet 50 mg (not administered)  desmopressin (DDAVP) 0.01 % (10 mcg/activation) spray 2 spray (not administered)  glycopyrrolate (ROBINUL) tablet 1 mg (not administered)  THEREMS-M TABS 1 tablet (not administered)  pantoprazole (PROTONIX) EC tablet 40 mg (not administered)  temazepam (RESTORIL) capsule 30 mg (not administered)  valproic acid (DEPAKENE) 250 MG capsule 750 mg (not administered)  ziprasidone (GEODON) capsule 80 mg (not administered)     Initial Impression / Assessment and Plan / ED Course  I have reviewed the triage vital signs and the nursing notes.  Pertinent labs & imaging results that were available during my care of the patient were reviewed by me and considered in my medical  decision making (see chart for details).  Clinical Course    Patient history of schizophrenia. Patient has been medically cleared. Psychiatry evaluated the patient he would not speak with them. They have recommended inpatient treatment  Final Clinical Impressions(s) / ED Diagnoses   Final diagnoses:  None    New Prescriptions New Prescriptions   No medications on file     Milton Ferguson, MD 02/21/16 2300

## 2016-02-21 NOTE — ED Notes (Signed)
Per [facility], telepsych assessment in approx 10 minutes.
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
## 2016-02-21 NOTE — ED Notes (Signed)
Pt assisted by safety sitter to restroom to change colostomy bag and be placed in purple scrubs. Pt has a staggering gait, gait regulates through assistance of sitter.

## 2016-02-21 NOTE — ED Triage Notes (Signed)
Pt states he is here bc he slipped in water yesterday and fell. Pt from [facility]. Per EMS staff stated pt has had some altered mental status over the weekend. Pt has hx of schizophrenia. Pt is having flight of ideas at this time. Cooperative. Nad. Alert/oriented to most.

## 2016-02-21 NOTE — ED Notes (Signed)
Pt given water at this time, wanded by security.

## 2016-02-21 NOTE — ED Notes (Signed)
Pt moved back to room in wheelchair for telepsych assessment. Security in room with patient. nad noted. Pt calm and cooperative at this time.

## 2016-02-21 NOTE — ED Notes (Addendum)
Per Arby Barrette, pt would not talk with [facility] staff. [facility] recommending inpatient placement until pt is able to talk with [facility] staff. [place] recommended IVC paperwork if pt attempts to leave again.  Pt remains at nurses station in wheelchair.
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
## 2016-02-21 NOTE — ED Notes (Signed)
Pt becoming agitated, pt requesting to get "fresh air and a smoke." pt informed could not go outside and smoke. Pt made it to EMS bay doors. ED staff able to console patient and bring pt back to wheelchair at nurses station. EDP aware and reported to offer pt nicotine patch. Pt refusing nicotine patch and currently sitting in wheelchair at nursing station.

## 2016-02-21 NOTE — ED Notes (Signed)
Pt ambulated with assistance to restroom at this time.

## 2016-02-21 NOTE — ED Notes (Addendum)
Pt requesting water. EDP aware and reported okay for pt to have PO fluids.

## 2016-02-21 NOTE — ED Notes (Addendum)
Pt wheeled back to room. Pt has two part colostomy bag. Bag fitted to site. Pt tolerated well.

## 2016-02-21 NOTE — ED Notes (Signed)
Pt resting with eyes closed, appears to be in no distress. Respirations are even and unlabored. Sitter in sight.

## 2016-02-21 NOTE — ED Notes (Signed)
Pt ambulatory to restroom

## 2016-02-22 DIAGNOSIS — F259 Schizoaffective disorder, unspecified: Secondary | ICD-10-CM | POA: Diagnosis not present

## 2016-02-22 MED ORDER — BENZTROPINE MESYLATE 1 MG PO TABS: 1.0000 mg | ORAL_TABLET | Freq: Two times a day (BID) | ORAL | Status: DC

## 2016-02-22 MED ORDER — DIVALPROEX SODIUM ER 500 MG PO TB24: 1000.0000 mg | ORAL_TABLET | Freq: Every day | ORAL | Status: DC

## 2016-02-22 MED ORDER — SERTRALINE HCL 50 MG PO TABS: 100.0000 mg | ORAL_TABLET | Freq: Every day | ORAL | Status: DC

## 2016-02-22 MED ORDER — DIVALPROEX SODIUM 250 MG PO DR TAB: 750.0000 mg | DELAYED_RELEASE_TABLET | Freq: Every day | ORAL | Status: DC

## 2016-02-22 MED ORDER — VALPROIC ACID 250 MG PO CAPS
750.0000 mg | ORAL_CAPSULE | Freq: Every day | ORAL | Status: DC
  Filled 2016-02-22: qty 3

## 2016-02-22 MED ORDER — CARVEDILOL 12.5 MG PO TABS: 25.0000 mg | ORAL_TABLET | Freq: Two times a day (BID) | ORAL | Status: DC

## 2016-02-22 MED ORDER — CLOZAPINE 100 MG PO TABS
500.0000 mg | ORAL_TABLET | Freq: Every day | ORAL | Status: DC
  Filled 2016-02-22: qty 5

## 2016-02-22 MED ORDER — CLOZAPINE 25 MG PO TABS
150.0000 mg | ORAL_TABLET | ORAL | Status: DC
  Filled 2016-02-22: qty 2

## 2016-02-22 NOTE — Progress Notes (Signed)
Spoke with Mardene Celeste, administrator Mardene Celeste of pt's assisted living facility, [facility] [phone] (pt has lived there x 1 month). She is aware pt has been evaluated by psychiatry this morning and recommended he is stable for d/c. Mardene Celeste states she will be dispatching staff member to ED to pick pt up as soon as possible this afternoon. Provided ED number in order to further coordinate if needed.  Sharren Bridge, MSW, LCSW Clinical Social Work, Disposition  02/22/2016 [phone]

## 2016-02-22 NOTE — ED Notes (Signed)
Pt wants to go smoke, but I informed him he cannot smoke at this facility. I offered pt a nicotine patch, but he denied it. Pt states he wants the voices to stop so I gave pt prn order for Restoril.

## 2016-02-22 NOTE — ED Provider Notes (Signed)
He has been evaluated by psychiatry, who feel that he is stable to return to his assisted living facility. The facility. Plans on coming to get him, and take him there.   At this time, the patient is alert and cooperative. He understands that he is going back to his assisted living facility.   Daleen Bo, MD 02/22/16 [phone]

## 2016-02-22 NOTE — Progress Notes (Signed)
Patient reassessed today to clarify disposition. He appears very sedated during assessment from receiving his nighttime medications after midnight. However, he does not appear to be responding to any internal stimuli during assessment and denies suicidal thoughts. Nursing staff report that patient was brought in by EMS for altered mental status. No behavior problems or aggression are reported as patient remained overnight. His medications were reviewed with Dr. Hampton Abbot who feel that as patient presents as stable that he can return to the family care home and follow up with his outpatient Psychiatrist. Patient has history of chronic schizophrenia and is currently taking Geodon, Clozaril, and Depakene since at least last year when discharged from [facility] in 2016. At this time the patient does not meet criteria for inpatient admission and is stable to return to the family care home once medically clear.

## 2016-02-22 NOTE — Discharge Instructions (Signed)
Follow-up with your psychiatric care provider and primary care provider, as needed.

## 2016-02-22 NOTE — ED Notes (Signed)
Meagan at [facility] called to let us know that It has been recommend  The Facility will ed to send Pt back to the Asst. Living  Facility from whence he came.  The Facility will send someone to get him this afternoon.  Nurse informed.

## 2016-03-07 ENCOUNTER — Emergency Department (HOSPITAL_COMMUNITY): Payer: Medicare Other

## 2016-03-07 ENCOUNTER — Emergency Department (HOSPITAL_COMMUNITY)
Admission: EM | Admit: 2016-03-07 | Discharge: 2016-03-07 | Disposition: A | Discharge status: Home / Self Care | Payer: Medicare Other | Source: Home / Self Care | Attending: Emergency Medicine | Admitting: Emergency Medicine

## 2016-03-07 ENCOUNTER — Encounter (HOSPITAL_COMMUNITY): Payer: Self-pay | Admitting: Emergency Medicine

## 2016-03-07 DIAGNOSIS — F250 Schizoaffective disorder, bipolar type: Secondary | ICD-10-CM | POA: Diagnosis not present

## 2016-03-07 DIAGNOSIS — I10 Essential (primary) hypertension: Secondary | ICD-10-CM

## 2016-03-07 DIAGNOSIS — R531 Weakness: Secondary | ICD-10-CM | POA: Diagnosis not present

## 2016-03-07 DIAGNOSIS — Z79899 Other long term (current) drug therapy: Secondary | ICD-10-CM | POA: Diagnosis not present

## 2016-03-07 DIAGNOSIS — F209 Schizophrenia, unspecified: Secondary | ICD-10-CM | POA: Insufficient documentation

## 2016-03-07 DIAGNOSIS — F17200 Nicotine dependence, unspecified, uncomplicated: Secondary | ICD-10-CM

## 2016-03-07 DIAGNOSIS — R404 Transient alteration of awareness: Secondary | ICD-10-CM | POA: Diagnosis not present

## 2016-03-07 LAB — HEPATIC FUNCTION PANEL
ALT: 18 U/L (ref 17–63)
AST: 27 U/L (ref 15–41)
Albumin: 3.3 g/dL — ABNORMAL LOW (ref 3.5–5.0)
Alkaline Phosphatase: 71 U/L (ref 38–126)
BILIRUBIN TOTAL: 0.4 mg/dL (ref 0.3–1.2)
Total Protein: 6.4 g/dL — ABNORMAL LOW (ref 6.5–8.1)

## 2016-03-07 LAB — URINALYSIS, ROUTINE W REFLEX MICROSCOPIC
BILIRUBIN URINE: NEGATIVE
GLUCOSE, UA: NEGATIVE mg/dL
HGB URINE DIPSTICK: NEGATIVE
Ketones, ur: NEGATIVE mg/dL
Leukocytes, UA: NEGATIVE
Nitrite: NEGATIVE
Protein, ur: NEGATIVE mg/dL
SPECIFIC GRAVITY, URINE: 1.02 (ref 1.005–1.030)
pH: 6 (ref 5.0–8.0)

## 2016-03-07 LAB — BASIC METABOLIC PANEL
Anion gap: 7 (ref 5–15)
BUN: 26 mg/dL — ABNORMAL HIGH (ref 6–20)
CALCIUM: 10.1 mg/dL (ref 8.9–10.3)
CHLORIDE: 110 mmol/L (ref 101–111)
CO2: 28 mmol/L (ref 22–32)
CREATININE: 1.64 mg/dL — AB (ref 0.61–1.24)
GFR calc non Af Amer: 48 mL/min — ABNORMAL LOW (ref >60)
GFR, EST AFRICAN AMERICAN: 55 mL/min — AB (ref >60)
GLUCOSE: 82 mg/dL (ref 65–99)
Potassium: 4.4 mmol/L (ref 3.5–5.1)
Sodium: 145 mmol/L (ref 135–145)

## 2016-03-07 LAB — CBC
HCT: 37.6 % — ABNORMAL LOW (ref 39.0–52.0)
HEMOGLOBIN: 12.2 g/dL — AB (ref 13.0–17.0)
MCH: 33.4 pg (ref 26.0–34.0)
MCHC: 32.4 g/dL (ref 30.0–36.0)
MCV: 103 fL — AB (ref 78.0–100.0)
PLATELETS: 91 10*3/uL — AB (ref 150–400)
RBC: 3.65 MIL/uL — AB (ref 4.22–5.81)
RDW: 15.3 % (ref 11.5–15.5)
WBC: 5.3 10*3/uL (ref 4.0–10.5)

## 2016-03-07 LAB — LIPASE, BLOOD: Lipase: 51 U/L (ref 11–51)

## 2016-03-07 LAB — CBG MONITORING, ED: Glucose-Capillary: 77 mg/dL (ref 65–99)

## 2016-03-07 MED ORDER — SODIUM CHLORIDE 0.9 % IV BOLUS (SEPSIS)
1000.0000 mL | Freq: Once | INTRAVENOUS | Status: AC
  Administered 2016-03-07: 1000 mL via INTRAVENOUS

## 2016-03-07 NOTE — ED Triage Notes (Signed)
Per [facility], pt sent from [facility] for generalized weakness and loss of appetite. CBG en route 120.

## 2016-03-07 NOTE — ED Provider Notes (Signed)
[facility] DEPT Provider Note   CSN: [id number] Arrival date & time: 03/07/16  1343     History   Chief Complaint Chief Complaint  Patient presents with  . Weakness    HPI Mario Ortega is a 50 y.o. male.  Level V caveat for mental health disorder. Patient says he is weak and not eating well. No specific complaints of fever, sweats, chills, chest pain, dyspnea, dysuria. He has schizophrenia. He has a colostomy which appears to be functioning. Severity of symptoms is mild to moderate.      Past Medical History:  Diagnosis Date  . Colostomy in place [facility])   . Drug abuse   . GERD (gastroesophageal reflux disease)   . Hypertension   . MRSA (methicillin resistant staph aureus) culture positive   . Schizo affective schizophrenia [facility])     Patient Active Problem List   Diagnosis Date Noted  . Tobacco use disorder 05/13/2015  . MRSA (methicillin resistant Staphylococcus aureus) 05/13/2015  . Schizophrenia ([place]) 05/12/2015  . Schizoaffective disorder, bipolar type ([place])   . HTN (hypertension) 05/09/2015  . GERD (gastroesophageal reflux disease) 05/09/2015  . GI bleed 04/07/2015    History reviewed. No pertinent surgical history.     Home Medications    Prior to Admission medications   Medication Sig Start Date End Date Taking? Authorizing Provider  amLODipine (NORVASC) 10 MG tablet Take 10 mg by mouth at bedtime.    Yes Historical Provider, MD  benztropine (COGENTIN) 1 MG tablet Take 1 mg by mouth 2 (two) times daily.   Yes Historical Provider, MD  carvedilol (COREG) 25 MG tablet Take 25 mg by mouth 2 (two) times daily with a meal.   Yes Historical Provider, MD  Cholecalciferol (VITAMIN D3) 2000 units TABS Take 1 tablet by mouth daily.   Yes Historical Provider, MD  cloZAPine (CLOZARIL) 100 MG tablet Take 150 mg by mouth every morning. Then take 5 tablets (500mg ) by mouth at bedtime.   Yes Historical Provider, MD  desmopressin (DDAVP) 0.2 MG tablet Take 0.4 mg  by mouth at bedtime.   Yes Historical Provider, MD  divalproex (DEPAKOTE ER) 500 MG 24 hr tablet Take 1,000 mg by mouth at bedtime.   Yes Historical Provider, MD  divalproex (DEPAKOTE) 250 MG DR tablet Take 750 mg by mouth every morning.   Yes Historical Provider, MD  Emollient (THERAPEUTIC DRY SKIN EX) Apply 1 application topically at bedtime. Apply to feet.   Yes Historical Provider, MD  folic acid (FOLVITE) 1 MG tablet Take 2 mg by mouth daily.   Yes Historical Provider, MD  glycopyrrolate (ROBINUL) 1 MG tablet Take 1 mg by mouth 3 (three) times daily.   Yes Historical Provider, MD  haloperidol (HALDOL) 10 MG tablet Take 5-10 mg by mouth 3 (three) times daily. Then take 10mg  by mouth at 3:00pm, and at bedtime.    Yes Historical Provider, MD  ipratropium (ATROVENT) 0.03 % nasal spray Place 2 sprays into both nostrils every 12 (twelve) hours.   Yes Historical Provider, MD  Multiple Vitamins-Minerals (THEREMS-M) TABS Take 1 tablet by mouth daily.   Yes Historical Provider, MD  sertraline (ZOLOFT) 100 MG tablet Take 100 mg by mouth at bedtime.   Yes Historical Provider, MD  Valproic Acid 250 MG CPDR Take 3-5 capsules by mouth 2 (two) times daily. Take 3 capsules in the morning and 5 capsules at bedtime.   Yes Historical Provider, MD    Family History History reviewed. No pertinent family history.  Social History Social History  Substance Use Topics  . Smoking status: Current Every Day Smoker  . Smokeless tobacco: Never Used  . Alcohol use No     Allergies   Penicillins   Review of Systems Review of Systems  Reason unable to perform ROS: Schizophrenia.     Physical Exam Updated Vital Signs BP 127/100 (BP Location: Left Wrist)   Pulse 66   Temp 97.8 F (36.6 C) (Oral)   Resp 14   Ht 5\' 11"  (1.803 m)   Wt 150 lb (68 kg)   SpO2 100%   BMI 20.92 kg/m   Physical Exam  Constitutional: He is oriented to person, place, and time. He appears well-developed and well-nourished.    Dry mucous membranes  HENT:  Head: Normocephalic and atraumatic.  Eyes: Conjunctivae are normal.  Neck: Neck supple.  Cardiovascular: Normal rate and regular rhythm.   Pulmonary/Chest: Effort normal and breath sounds normal.  Abdominal: Soft. Bowel sounds are normal.  Musculoskeletal: Normal range of motion.  Neurological: He is alert and oriented to person, place, and time.  Skin: Skin is warm and dry.  Psychiatric:  Blunted affect  Nursing note and vitals reviewed.    ED Treatments / Results  Labs (all labs ordered are listed, but only abnormal results are displayed) Labs Reviewed  BASIC METABOLIC PANEL - Abnormal; Notable for the following:       Result Value   BUN 26 (*)    Creatinine, Ser 1.64 (*)    GFR calc non Af Amer 48 (*)    GFR calc Af Amer 55 (*)    All other components within normal limits  CBC - Abnormal; Notable for the following:    RBC 3.65 (*)    Hemoglobin 12.2 (*)    HCT 37.6 (*)    MCV 103.0 (*)    Platelets 91 (*)    All other components within normal limits  HEPATIC FUNCTION PANEL - Abnormal; Notable for the following:    Total Protein 6.4 (*)    Albumin 3.3 (*)    Bilirubin, Direct <0.1 (*)    All other components within normal limits  URINALYSIS, ROUTINE W REFLEX MICROSCOPIC (NOT AT [facility])  LIPASE, BLOOD  CBG MONITORING, ED    EKG  EKG Interpretation None       Radiology Dg Chest 2 View  Result Date: 03/07/2016 CLINICAL DATA:  Weakness EXAM: CHEST  2 VIEW COMPARISON:  None. FINDINGS: The heart size and mediastinal contours are within normal limits. Both lungs are clear. The visualized skeletal structures are unremarkable. Nipple shadows are noted bilaterally. IMPRESSION: No active cardiopulmonary disease. Electronically Signed   By: Inez Catalina M.D.   On: 03/07/2016 16:18    Procedures Procedures (including critical care time)  Medications Ordered in ED Medications  sodium chloride 0.9 % bolus 1,000 mL (1,000 mLs Intravenous  New Bag/Given 03/07/16 1626)  sodium chloride 0.9 % bolus 1,000 mL (1,000 mLs Intravenous New Bag/Given 03/07/16 1815)     Initial Impression / Assessment and Plan / ED Course  I have reviewed the triage vital signs and the nursing notes.  Pertinent labs & imaging results that were available during my care of the patient were reviewed by me and considered in my medical decision making (see chart for details).  Clinical Course    Patient is in no acute distress. He feels better after IV hydration. Creatinines been elevated in the past. Chest x-ray and urinalysis showed no infection  Final  Clinical Impressions(s) / ED Diagnoses   Final diagnoses:  Weakness    New Prescriptions New Prescriptions   No medications on file     Nat Christen, MD 03/07/16 1911

## 2016-03-07 NOTE — ED Notes (Signed)
Awaiting transport from facility. Sitter at bedside.

## 2016-03-07 NOTE — Discharge Instructions (Signed)
Tests showed no life-threatening condition.  Follow-up your primary care doctor. °

## 2016-03-09 ENCOUNTER — Encounter (HOSPITAL_COMMUNITY): Payer: Self-pay

## 2016-03-09 ENCOUNTER — Inpatient Hospital Stay (HOSPITAL_COMMUNITY)
Admission: EM | Admit: 2016-03-09 | Discharge: 2016-03-16 | DRG: 813 | Disposition: A | Discharge status: Home Health | Payer: Medicare Other | Attending: Internal Medicine | Admitting: Internal Medicine

## 2016-03-09 ENCOUNTER — Emergency Department (HOSPITAL_COMMUNITY): Payer: Medicare Other

## 2016-03-09 DIAGNOSIS — R109 Unspecified abdominal pain: Secondary | ICD-10-CM

## 2016-03-09 DIAGNOSIS — N183 Chronic kidney disease, stage 3 unspecified: Secondary | ICD-10-CM | POA: Diagnosis present

## 2016-03-09 DIAGNOSIS — R5383 Other fatigue: Secondary | ICD-10-CM | POA: Diagnosis not present

## 2016-03-09 DIAGNOSIS — D696 Thrombocytopenia, unspecified: Secondary | ICD-10-CM

## 2016-03-09 DIAGNOSIS — Z933 Colostomy status: Secondary | ICD-10-CM

## 2016-03-09 DIAGNOSIS — D539 Nutritional anemia, unspecified: Secondary | ICD-10-CM | POA: Diagnosis not present

## 2016-03-09 DIAGNOSIS — I129 Hypertensive chronic kidney disease with stage 1 through stage 4 chronic kidney disease, or unspecified chronic kidney disease: Secondary | ICD-10-CM | POA: Diagnosis not present

## 2016-03-09 DIAGNOSIS — R404 Transient alteration of awareness: Secondary | ICD-10-CM | POA: Diagnosis not present

## 2016-03-09 DIAGNOSIS — R1010 Upper abdominal pain, unspecified: Secondary | ICD-10-CM

## 2016-03-09 DIAGNOSIS — N179 Acute kidney failure, unspecified: Secondary | ICD-10-CM | POA: Diagnosis present

## 2016-03-09 DIAGNOSIS — D6959 Other secondary thrombocytopenia: Secondary | ICD-10-CM | POA: Diagnosis not present

## 2016-03-09 DIAGNOSIS — E860 Dehydration: Secondary | ICD-10-CM | POA: Insufficient documentation

## 2016-03-09 DIAGNOSIS — F201 Disorganized schizophrenia: Secondary | ICD-10-CM

## 2016-03-09 DIAGNOSIS — F209 Schizophrenia, unspecified: Secondary | ICD-10-CM | POA: Diagnosis present

## 2016-03-09 DIAGNOSIS — R531 Weakness: Secondary | ICD-10-CM | POA: Diagnosis not present

## 2016-03-09 DIAGNOSIS — T426X1A Poisoning by other antiepileptic and sedative-hypnotic drugs, accidental (unintentional), initial encounter: Secondary | ICD-10-CM | POA: Diagnosis not present

## 2016-03-09 DIAGNOSIS — R4182 Altered mental status, unspecified: Secondary | ICD-10-CM | POA: Diagnosis not present

## 2016-03-09 DIAGNOSIS — K219 Gastro-esophageal reflux disease without esophagitis: Secondary | ICD-10-CM | POA: Diagnosis not present

## 2016-03-09 DIAGNOSIS — F17200 Nicotine dependence, unspecified, uncomplicated: Secondary | ICD-10-CM | POA: Diagnosis not present

## 2016-03-09 DIAGNOSIS — R410 Disorientation, unspecified: Secondary | ICD-10-CM | POA: Diagnosis not present

## 2016-03-09 DIAGNOSIS — I10 Essential (primary) hypertension: Secondary | ICD-10-CM | POA: Diagnosis present

## 2016-03-09 DIAGNOSIS — E870 Hyperosmolality and hypernatremia: Secondary | ICD-10-CM | POA: Diagnosis not present

## 2016-03-09 DIAGNOSIS — F250 Schizoaffective disorder, bipolar type: Secondary | ICD-10-CM | POA: Diagnosis present

## 2016-03-09 DIAGNOSIS — G4700 Insomnia, unspecified: Secondary | ICD-10-CM | POA: Diagnosis not present

## 2016-03-09 DIAGNOSIS — T426X5A Adverse effect of other antiepileptic and sedative-hypnotic drugs, initial encounter: Secondary | ICD-10-CM | POA: Diagnosis present

## 2016-03-09 DIAGNOSIS — K8020 Calculus of gallbladder without cholecystitis without obstruction: Secondary | ICD-10-CM | POA: Diagnosis not present

## 2016-03-09 HISTORY — DX: Complete loss of teeth, unspecified cause, unspecified class: K08.109

## 2016-03-09 HISTORY — DX: Anemia, unspecified: D64.9

## 2016-03-09 HISTORY — DX: Anodontia: K00.0

## 2016-03-09 LAB — COMPREHENSIVE METABOLIC PANEL
ALT: 23 U/L (ref 17–63)
ANION GAP: 8 (ref 5–15)
AST: 32 U/L (ref 15–41)
Albumin: 3.2 g/dL — ABNORMAL LOW (ref 3.5–5.0)
Alkaline Phosphatase: 68 U/L (ref 38–126)
BUN: 33 mg/dL — ABNORMAL HIGH (ref 6–20)
CHLORIDE: 112 mmol/L — AB (ref 101–111)
CO2: 27 mmol/L (ref 22–32)
CREATININE: 1.82 mg/dL — AB (ref 0.61–1.24)
Calcium: 10.4 mg/dL — ABNORMAL HIGH (ref 8.9–10.3)
GFR, EST AFRICAN AMERICAN: 49 mL/min — AB (ref >60)
GFR, EST NON AFRICAN AMERICAN: 42 mL/min — AB (ref >60)
Glucose, Bld: 89 mg/dL (ref 65–99)
POTASSIUM: 4.5 mmol/L (ref 3.5–5.1)
SODIUM: 147 mmol/L — AB (ref 135–145)
Total Bilirubin: 0.6 mg/dL (ref 0.3–1.2)
Total Protein: 6.2 g/dL — ABNORMAL LOW (ref 6.5–8.1)

## 2016-03-09 LAB — CBC WITH DIFFERENTIAL/PLATELET
Basophils Absolute: 0 10*3/uL (ref 0.0–0.1)
Basophils Relative: 0 %
EOS ABS: 0 10*3/uL (ref 0.0–0.7)
EOS PCT: 1 %
HCT: 37.2 % — ABNORMAL LOW (ref 39.0–52.0)
Hemoglobin: 12.1 g/dL — ABNORMAL LOW (ref 13.0–17.0)
LYMPHS ABS: 1.8 10*3/uL (ref 0.7–4.0)
LYMPHS PCT: 29 %
MCH: 33.5 pg (ref 26.0–34.0)
MCHC: 32.5 g/dL (ref 30.0–36.0)
MCV: 103 fL — AB (ref 78.0–100.0)
MONO ABS: 1 10*3/uL (ref 0.1–1.0)
Monocytes Relative: 15 %
Neutro Abs: 3.5 10*3/uL (ref 1.7–7.7)
Neutrophils Relative %: 55 %
PLATELETS: 50 10*3/uL — AB (ref 150–400)
RBC: 3.61 MIL/uL — ABNORMAL LOW (ref 4.22–5.81)
RDW: 15.4 % (ref 11.5–15.5)
WBC: 6.3 10*3/uL (ref 4.0–10.5)

## 2016-03-09 LAB — VALPROIC ACID LEVEL: VALPROIC ACID LVL: 112 ug/mL — AB (ref 50.0–100.0)

## 2016-03-09 LAB — LIPASE, BLOOD: LIPASE: 41 U/L (ref 11–51)

## 2016-03-09 MED ORDER — CLOZAPINE 100 MG PO TABS: 150.0000 mg | ORAL_TABLET | Freq: Two times a day (BID) | ORAL | Status: DC

## 2016-03-09 MED ORDER — ACETAMINOPHEN 650 MG RE SUPP: 650.0000 mg | Freq: Four times a day (QID) | RECTAL | Status: DC | PRN

## 2016-03-09 MED ORDER — SERTRALINE HCL 50 MG PO TABS
100.0000 mg | ORAL_TABLET | Freq: Every day | ORAL | Status: DC
  Administered 2016-03-11 – 2016-03-15 (×5): 100 mg via ORAL
  Filled 2016-03-09 (×5): qty 2

## 2016-03-09 MED ORDER — HALOPERIDOL 5 MG PO TABS
5.0000 mg | ORAL_TABLET | Freq: Every day | ORAL | Status: DC
  Administered 2016-03-11 – 2016-03-16 (×6): 5 mg via ORAL
  Filled 2016-03-09 (×6): qty 1

## 2016-03-09 MED ORDER — ONDANSETRON HCL 4 MG PO TABS: 4.0000 mg | ORAL_TABLET | Freq: Four times a day (QID) | ORAL | Status: DC | PRN

## 2016-03-09 MED ORDER — IPRATROPIUM BROMIDE 0.06 % NA SOLN
2.0000 | Freq: Two times a day (BID) | NASAL | Status: DC
  Administered 2016-03-13 – 2016-03-15 (×3): 2 via NASAL
  Filled 2016-03-09: qty 30

## 2016-03-09 MED ORDER — CARVEDILOL 12.5 MG PO TABS
25.0000 mg | ORAL_TABLET | Freq: Two times a day (BID) | ORAL | Status: DC
  Administered 2016-03-11 – 2016-03-16 (×10): 25 mg via ORAL
  Filled 2016-03-09 (×11): qty 2

## 2016-03-09 MED ORDER — POLYETHYLENE GLYCOL 3350 17 G PO PACK: 17.0000 g | PACK | Freq: Every day | ORAL | Status: DC | PRN

## 2016-03-09 MED ORDER — LORAZEPAM 2 MG/ML IJ SOLN
1.0000 mg | Freq: Once | INTRAMUSCULAR | Status: AC
  Administered 2016-03-09: 1 mg via INTRAVENOUS
  Filled 2016-03-09: qty 1

## 2016-03-09 MED ORDER — IOPAMIDOL (ISOVUE-300) INJECTION 61%
100.0000 mL | Freq: Once | INTRAVENOUS | Status: AC | PRN
  Administered 2016-03-09: 100 mL via INTRAVENOUS

## 2016-03-09 MED ORDER — ONDANSETRON HCL 4 MG/2ML IJ SOLN: 4.0000 mg | Freq: Four times a day (QID) | INTRAMUSCULAR | Status: DC | PRN

## 2016-03-09 MED ORDER — IOPAMIDOL (ISOVUE-300) INJECTION 61%
INTRAVENOUS | Status: AC
  Administered 2016-03-09: 30 mL
  Filled 2016-03-09: qty 30

## 2016-03-09 MED ORDER — FOLIC ACID 1 MG PO TABS
2.0000 mg | ORAL_TABLET | Freq: Every day | ORAL | Status: DC
  Administered 2016-03-12 – 2016-03-16 (×5): 2 mg via ORAL
  Filled 2016-03-09 (×6): qty 2

## 2016-03-09 MED ORDER — BENZTROPINE MESYLATE 1 MG PO TABS
1.0000 mg | ORAL_TABLET | Freq: Two times a day (BID) | ORAL | Status: DC
Start: 2016-03-10 — End: 2016-03-16
  Administered 2016-03-11 – 2016-03-16 (×11): 1 mg via ORAL
  Filled 2016-03-09 (×11): qty 1

## 2016-03-09 MED ORDER — LABETALOL HCL 5 MG/ML IV SOLN: 10.0000 mg | INTRAVENOUS | Status: DC | PRN

## 2016-03-09 MED ORDER — SODIUM CHLORIDE 0.9 % IV SOLN
INTRAVENOUS | Status: DC
  Administered 2016-03-09: 22:00:00 via INTRAVENOUS

## 2016-03-09 MED ORDER — VITAMIN D 1000 UNITS PO TABS
2000.0000 [IU] | ORAL_TABLET | Freq: Every day | ORAL | Status: DC
  Administered 2016-03-12 – 2016-03-16 (×5): 2000 [IU] via ORAL
  Filled 2016-03-09 (×6): qty 2

## 2016-03-09 MED ORDER — ADULT MULTIVITAMIN W/MINERALS CH
ORAL_TABLET | Freq: Every day | ORAL | Status: DC
  Administered 2016-03-12 – 2016-03-13 (×2): 1 via ORAL
  Administered 2016-03-14: 10:00:00 via ORAL
  Administered 2016-03-15: 1 via ORAL
  Administered 2016-03-16: 10:00:00 via ORAL
  Filled 2016-03-09 (×5): qty 1

## 2016-03-09 MED ORDER — GLYCOPYRROLATE 1 MG PO TABS
1.0000 mg | ORAL_TABLET | Freq: Three times a day (TID) | ORAL | Status: DC
  Administered 2016-03-11 – 2016-03-16 (×15): 1 mg via ORAL
  Filled 2016-03-09 (×22): qty 1

## 2016-03-09 MED ORDER — SODIUM CHLORIDE 0.9 % IV BOLUS (SEPSIS)
1000.0000 mL | Freq: Once | INTRAVENOUS | Status: AC
  Administered 2016-03-09: 1000 mL via INTRAVENOUS

## 2016-03-09 MED ORDER — AMLODIPINE BESYLATE 5 MG PO TABS
10.0000 mg | ORAL_TABLET | Freq: Every day | ORAL | Status: DC
  Administered 2016-03-11 – 2016-03-15 (×5): 10 mg via ORAL
  Filled 2016-03-09 (×5): qty 2

## 2016-03-09 MED ORDER — DESMOPRESSIN ACETATE 0.2 MG PO TABS
0.4000 mg | ORAL_TABLET | Freq: Every day | ORAL | Status: DC
  Administered 2016-03-11 – 2016-03-15 (×5): 0.4 mg via ORAL
  Filled 2016-03-09 (×7): qty 2

## 2016-03-09 MED ORDER — SODIUM CHLORIDE 0.9 % IV SOLN
INTRAVENOUS | Status: DC
  Administered 2016-03-10 (×2): via INTRAVENOUS

## 2016-03-09 MED ORDER — SODIUM CHLORIDE 0.9 % IV SOLN
INTRAVENOUS | Status: DC
  Administered 2016-03-09: via INTRAVENOUS

## 2016-03-09 MED ORDER — ACETAMINOPHEN 325 MG PO TABS
650.0000 mg | ORAL_TABLET | Freq: Four times a day (QID) | ORAL | Status: DC | PRN
  Administered 2016-03-15: 650 mg via ORAL
  Filled 2016-03-09: qty 2

## 2016-03-09 NOTE — ED Triage Notes (Signed)
Pt resident of [facility] facility and was sent to er for evaluation of low platelets and decreased po intake.  Reports pt hasn't eaten in 3 days.  Pt told ems he hasn't eaten because the food isn't good.  cbg 103 with ems, afebrile, bp 132/90, hr 68, 100% on room air.

## 2016-03-09 NOTE — ED Provider Notes (Addendum)
[facility] DEPT Provider Note   CSN: [id number] Arrival date & time: 03/09/16  1801     History   Chief Complaint Chief Complaint  Patient presents with  . Fatigue    HPI Ankith Issa is a 50 y.o. male.  HPI level V caveat patient does not answer all questions. Patient complains of diffuse abdominal pain for unknown period of time and also states that he is presently hungry. No treatment prior to coming here.Further history from Ms Manfred Shirts via telephone, patient has been less interactive for the past 2 weeks, has not eaten anything in 3 days. He is normally very social and well interactive.  Past Medical History:  Diagnosis Date  . Anemia    "Nacrocytic"  . Colostomy in place [facility])   . Drug abuse   . Edentulous   . GERD (gastroesophageal reflux disease)   . Hypertension   . MRSA (methicillin resistant staph aureus) culture positive   . Schizo affective schizophrenia [facility])     Patient Active Problem List   Diagnosis Date Noted  . Tobacco use disorder 05/13/2015  . MRSA (methicillin resistant Staphylococcus aureus) 05/13/2015  . Schizophrenia ([place]) 05/12/2015  . Schizoaffective disorder, bipolar type ([place])   . HTN (hypertension) 05/09/2015  . GERD (gastroesophageal reflux disease) 05/09/2015  . GI bleed 04/07/2015    Past Surgical History:  Procedure Laterality Date  . COLOSTOMY         Home Medications    Prior to Admission medications   Medication Sig Start Date End Date Taking? Authorizing Provider  amLODipine (NORVASC) 10 MG tablet Take 10 mg by mouth at bedtime.     Historical Provider, MD  benztropine (COGENTIN) 1 MG tablet Take 1 mg by mouth 2 (two) times daily.    Historical Provider, MD  carvedilol (COREG) 25 MG tablet Take 25 mg by mouth 2 (two) times daily with a meal.    Historical Provider, MD  Cholecalciferol (VITAMIN D3) 2000 units TABS Take 1 tablet by mouth daily.    Historical Provider, MD  cloZAPine (CLOZARIL) 100 MG tablet  Take 150 mg by mouth every morning. Then take 5 tablets (500mg ) by mouth at bedtime.    Historical Provider, MD  desmopressin (DDAVP) 0.2 MG tablet Take 0.4 mg by mouth at bedtime.    Historical Provider, MD  divalproex (DEPAKOTE ER) 500 MG 24 hr tablet Take 1,000 mg by mouth at bedtime.    Historical Provider, MD  divalproex (DEPAKOTE) 250 MG DR tablet Take 750 mg by mouth every morning.    Historical Provider, MD  Emollient (THERAPEUTIC DRY SKIN EX) Apply 1 application topically at bedtime. Apply to feet.    Historical Provider, MD  folic acid (FOLVITE) 1 MG tablet Take 2 mg by mouth daily.    Historical Provider, MD  glycopyrrolate (ROBINUL) 1 MG tablet Take 1 mg by mouth 3 (three) times daily.    Historical Provider, MD  haloperidol (HALDOL) 10 MG tablet Take 5-10 mg by mouth 3 (three) times daily. Then take 10mg  by mouth at 3:00pm, and at bedtime.     Historical Provider, MD  ipratropium (ATROVENT) 0.03 % nasal spray Place 2 sprays into both nostrils every 12 (twelve) hours.    Historical Provider, MD  Multiple Vitamins-Minerals (THEREMS-M) TABS Take 1 tablet by mouth daily.    Historical Provider, MD  sertraline (ZOLOFT) 100 MG tablet Take 100 mg by mouth at bedtime.    Historical Provider, MD  Valproic Acid 250 MG CPDR Take  3-5 capsules by mouth 2 (two) times daily. Take 3 capsules in the morning and 5 capsules at bedtime.    Historical Provider, MD    Family History No family history on file.  Social History Social History  Substance Use Topics  . Smoking status: Current Every Day Smoker  . Smokeless tobacco: Never Used  . Alcohol use No     Allergies   Penicillins   Review of Systems Review of Systems  Unable to perform ROS: Other  Patient doesn't answer all questions   Physical Exam Updated Vital Signs BP (!) 145/103 (BP Location: Right Arm) Comment: Simultaneous filing. User may not have seen previous data.  Pulse 71 Comment: Simultaneous filing. User may not have  seen previous data.  Temp 97.6 F (36.4 C) (Oral)   Resp 15 Comment: Simultaneous filing. User may not have seen previous data.  Ht 5\' 11"  (1.803 m)   SpO2 100% Comment: Simultaneous filing. User may not have seen previous data.  Physical Exam  Constitutional:  Chronically ill-appearing  HENT:  Head: Normocephalic and atraumatic.  Mucous membranes dry  Eyes: Conjunctivae are normal. Pupils are equal, round, and reactive to light.  Neck: Neck supple. No tracheal deviation present. No thyromegaly present.  Cardiovascular: Normal rate and regular rhythm.   No murmur heard. Pulmonary/Chest: Effort normal and breath sounds normal.  Abdominal: Soft. Bowel sounds are normal. He exhibits no distension. There is no tenderness.   Midline Surgical scar colostomy in place right abdomen putting out brown stool  Genitourinary: Penis normal.  Musculoskeletal: Normal range of motion. He exhibits no edema or tenderness.  Neurological: He is alert. Coordination normal.  Follow simple commands moves all extremities  Skin: Skin is warm and dry. No rash noted.  Psychiatric: He has a normal mood and affect.  Nursing note and vitals reviewed.    ED Treatments / Results  Labs (all labs ordered are listed, but only abnormal results are displayed) Labs Reviewed  CBC WITH DIFFERENTIAL/PLATELET  COMPREHENSIVE METABOLIC PANEL  LIPASE, BLOOD  VALPROIC ACID LEVEL    EKG  EKG Interpretation  Date/Time:  Thursday March 09 2016 18:09:44 EDT Ventricular Rate:  70 PR Interval:    QRS Duration: 114 QT Interval:  399 QTC Calculation: 431 R Axis:   -17 Text Interpretation:  Normal sinus rhythm Incomplete RBBB and LAFB Minimal ST elevation, anterior leads No significant change since last tracing Confirmed by Winfred Leeds  MD, Gavon Majano [phone]) on 03/09/2016 10:37:39 PM      Results for orders placed or performed during the hospital encounter of 03/09/16  CBC with Differential/Platelet  Result Value Ref  Range   WBC 6.3 4.0 - 10.5 K/uL   RBC 3.61 (L) 4.22 - 5.81 MIL/uL   Hemoglobin 12.1 (L) 13.0 - 17.0 g/dL   HCT 37.2 (L) 39.0 - 52.0 %   MCV 103.0 (H) 78.0 - 100.0 fL   MCH 33.5 26.0 - 34.0 pg   MCHC 32.5 30.0 - 36.0 g/dL   RDW 15.4 11.5 - 15.5 %   Platelets 50 (L) 150 - 400 K/uL   Neutrophils Relative % 55 %   Neutro Abs 3.5 1.7 - 7.7 K/uL   Lymphocytes Relative 29 %   Lymphs Abs 1.8 0.7 - 4.0 K/uL   Monocytes Relative 15 %   Monocytes Absolute 1.0 0.1 - 1.0 K/uL   Eosinophils Relative 1 %   Eosinophils Absolute 0.0 0.0 - 0.7 K/uL   Basophils Relative 0 %   Basophils Absolute  0.0 0.0 - 0.1 K/uL  Comprehensive metabolic panel  Result Value Ref Range   Sodium 147 (H) 135 - 145 mmol/L   Potassium 4.5 3.5 - 5.1 mmol/L   Chloride 112 (H) 101 - 111 mmol/L   CO2 27 22 - 32 mmol/L   Glucose, Bld 89 65 - 99 mg/dL   BUN 33 (H) 6 - 20 mg/dL   Creatinine, Ser 1.82 (H) 0.61 - 1.24 mg/dL   Calcium 10.4 (H) 8.9 - 10.3 mg/dL   Total Protein 6.2 (L) 6.5 - 8.1 g/dL   Albumin 3.2 (L) 3.5 - 5.0 g/dL   AST 32 15 - 41 U/L   ALT 23 17 - 63 U/L   Alkaline Phosphatase 68 38 - 126 U/L   Total Bilirubin 0.6 0.3 - 1.2 mg/dL   GFR calc non Af Amer 42 (L) >60 mL/min   GFR calc Af Amer 49 (L) >60 mL/min   Anion gap 8 5 - 15  Lipase, blood  Result Value Ref Range   Lipase 41 11 - 51 U/L  Valproic acid level  Result Value Ref Range   Valproic Acid Lvl 112 (H) 50.0 - 100.0 ug/mL   Dg Chest 2 View  Result Date: 03/07/2016 CLINICAL DATA:  Weakness EXAM: CHEST  2 VIEW COMPARISON:  None. FINDINGS: The heart size and mediastinal contours are within normal limits. Both lungs are clear. The visualized skeletal structures are unremarkable. Nipple shadows are noted bilaterally. IMPRESSION: No active cardiopulmonary disease. Electronically Signed   By: Inez Catalina M.D.   On: 03/07/2016 16:18   Ct Head Wo Contrast  Result Date: 03/09/2016 CLINICAL DATA:  Altered mental status. Decreased p.o. intake. Low  platelets. EXAM: CT HEAD WITHOUT CONTRAST TECHNIQUE: Contiguous axial images were obtained from the base of the skull through the vertex without intravenous contrast. COMPARISON:  None. FINDINGS: Brain: There is mild cerebral atrophy which is advanced for age. There is no evidence of acute cortical infarct, intracranial hemorrhage, mass, midline shift, or extra-axial fluid collection. Vascular: No hyperdense vessel or unexpected calcification. Skull: Normal. Negative for fracture or focal lesion. Sinuses/Orbits: Right maxillary sinus mucous retention cysts. Minimal left maxillary sinus mucosal thickening. Clear mastoid air cells. Unremarkable orbits. Other: None. IMPRESSION: 1. No evidence of acute intracranial abnormality. 2. Premature cerebral atrophy. Electronically Signed   By: Logan Bores M.D.   On: 03/09/2016 21:41   Ct Abdomen Pelvis W Contrast  Result Date: 03/09/2016 CLINICAL DATA:  Acute onset of loss of appetite. Decreased hippocampal. Initial encounter. EXAM: CT ABDOMEN AND PELVIS WITH CONTRAST TECHNIQUE: Multidetector CT imaging of the abdomen and pelvis was performed using the standard protocol following bolus administration of intravenous contrast. CONTRAST:  168mL ISOVUE-300 IOPAMIDOL (ISOVUE-300) INJECTION 61% COMPARISON:  CT of the abdomen and pelvis from 04/07/2015 FINDINGS: Lower chest: Minimal bibasilar atelectasis is noted. The visualized portions of the mediastinum are unremarkable. Hepatobiliary: The liver is grossly unremarkable in appearance. Stones are noted dependently within the gallbladder. The common bile duct remains normal in caliber. Pancreas: The pancreas is unremarkable in appearance. Spleen: The spleen is within normal limits. Adrenals/Urinary Tract: The adrenal glands are unremarkable in appearance. Small nonobstructing left renal stones are seen, measuring up to 2 mm in size. The kidneys are otherwise unremarkable. There is no evidence of hydronephrosis. No obstructing  ureteral stones are identified. No perinephric stranding is seen. Stomach/Bowel: The stomach is unremarkable in appearance. The small bowel is grossly unremarkable. A right lower quadrant colostomy is unremarkable in appearance.  The patient's Hartmann's pouch is within normal limits. Vascular/Lymphatic: Minimal calcification is seen along the distal abdominal aorta and its branches. No acute vascular abnormalities are seen. There is partial decompression of the IVC, likely reflecting volume depletion. No retroperitoneal lymphadenopathy is seen. No pelvic sidewall lymphadenopathy is identified. Reproductive: The bladder is significantly distended and grossly unremarkable in appearance. The prostate remains normal in size. Musculoskeletal: No acute osseous abnormalities are identified. The visualized musculature is within normal limits. Vacuum phenomenon is noted at L5-S1. Other: Mild focal soft tissue inflammation is noted at the left lower back, overlying the left side of the sacrum, raising concern for a mild sacral decubitus ulceration. IMPRESSION: 1. Mild focal soft tissue inflammation at the left lower back, overlying the left side of the sacrum, concerning for a mild sacral decubitus ulceration. Would correlate clinically. 2. Partial decompression of the IVC likely reflects volume depletion (dehydration). 3. Small nonobstructing left renal stones measure up to 2 mm in size. 4. Right lower quadrant colostomy is unremarkable. Hartmann's pouch is within normal limits. 5. Cholelithiasis.  Gallbladder otherwise grossly unremarkable. Electronically Signed   By: Garald Balding M.D.   On: 03/09/2016 21:45   Radiology No results found.  Procedures Procedures (including critical care time)  Medications Ordered in ED Medications  sodium chloride 0.9 % bolus 1,000 mL (not administered)   Patient was reexamined after CT report. He has no decubitus ulcer on his back .  Initial Impression / Assessment and Plan /  ED Course  I have reviewed the triage vital signs and the nursing notes. Pertinent labs & imaging results that were available during my care of the patient were reviewed by me and considered in my medical decision making (see chart for details).  Clinical Course  8:25 PM patient is somewhat agitated trying to get out of bed. He is alert, follows simple commands. Moves all extremities. IV Ativan ordered  10:10 p.m. patient somnolent after treatment with intravenous Ativan. Patient is clinically dehydrated. Behavioral changes and dehydration consistent with valproic acid toxicity.Dr Myna Hidalgo consulted. Plan 23 hour observation medical surgical floor. Intravenous hydration Final Clinical Impressions(s) / ED Diagnoses  Diagnosis #1 dehydration #2. Valproic acid toxicity #3 mental status change Final diagnoses:  None    New Prescriptions New Prescriptions   No medications on file     Orlie Dakin, MD 03/09/16 [place], MD 03/09/16 [id number]

## 2016-03-09 NOTE — H&P (Signed)
History and Physical    Mario Ortega [id number] DOB: April 11, 1966 DOA: 03/09/2016  PCP: Christie Nottingham., PA   Patient coming from: [facility]   Chief Complaint: Low platelets, lethargy   HPI: Mario Ortega is a 50 y.o. male with medical history significant for schizophrenia, chronic kidney disease stage III, hypertension, and macrocytic anemia who presents to the emergency department from his nursing facility for evaluation of low platelets and lethargy. Patient had reportedly been in his usual state until approximately 3 days ago when he was noted to be more lethargic and not eating. He was evaluated in the emergency department 2 days ago for this, noted to have thrombocytopenia, but was otherwise stable, had no complaints himself, and was returned to his facility. Since his return, his lethargy is increased and he continues to refuse food. He has denied recent fevers, chills, chest pain, palpitations, dyspnea, or cough. He is also denied abdominal pain.    ED Course: Upon arrival to the ED, patient is found to be afebrile, saturating well on room air, hypertensive in the 140/100 range, and vitals otherwise stable. EKG demonstrates a sinus rhythm with incomplete right bundle branch block and noncontrast head CT is negative for acute intracranial abnormality. Chemistry panels notable for a hypernatremia, hyperchloremia, and serum creatinine 1.82, up from 1.48 two weeks prior. Valproic acid level is elevated to 112. Patient became agitated in the emergency department and was treated with a dose of IV Ativan. CT of the abdomen and pelvis was obtained and there is a decompressed IVC consistent with dehydration and no emergent findings. Patient was given a 1 L normal saline bolus and continued on an aggressive IV hydration with 250 cc/hr. he remained hemodynamically stable in the ED and in no respiratory distress. He will be observed on the medical/surgical unit for ongoing evaluation and  management of lethargy, loss of appetite, and thrombocytopenia, possibly secondary to Depakote use with mild toxicity.  Review of Systems:  Unable to obtain ROS secondary to patient's clinical condition after being chemically sedated in the ED.  Past Medical History:  Diagnosis Date  . Anemia    "Nacrocytic"  . Colostomy in place [facility])   . Drug abuse   . Edentulous   . GERD (gastroesophageal reflux disease)   . Hypertension   . MRSA (methicillin resistant staph aureus) culture positive   . Schizo affective schizophrenia [facility])     Past Surgical History:  Procedure Laterality Date  . COLOSTOMY       reports that he has been smoking.  He has never used smokeless tobacco. He reports that he does not drink alcohol or use drugs.  Allergies  Allergen Reactions  . Penicillins Rash    Has patient had a PCN reaction causing immediate rash, facial/tongue/throat swelling, SOB or lightheadedness with hypotension: No Has patient had a PCN reaction causing severe rash involving mucus membranes or skin necrosis: No Has patient had a PCN reaction that required hospitalization No Has patient had a PCN reaction occurring within the last 10 years: No If all of the above answers are "NO", then may proceed with Cephalosporin use.     History reviewed. No pertinent family history.   Prior to Admission medications   Medication Sig Start Date End Date Taking? Authorizing Provider  amLODipine (NORVASC) 10 MG tablet Take 10 mg by mouth at bedtime.    Yes Historical Provider, MD  benztropine (COGENTIN) 1 MG tablet Take 1 mg by mouth 2 (two) times daily.  Yes Historical Provider, MD  carvedilol (COREG) 25 MG tablet Take 25 mg by mouth 2 (two) times daily with a meal.   Yes Historical Provider, MD  Cholecalciferol (VITAMIN D3) 2000 units TABS Take 1 tablet by mouth daily.   Yes Historical Provider, MD  cloZAPine (CLOZARIL) 100 MG tablet Take 150-500 mg by mouth 2 (two) times daily. Then take 5  tablets (500mg ) by mouth at bedtime.    Yes Historical Provider, MD  desmopressin (DDAVP) 0.2 MG tablet Take 0.4 mg by mouth at bedtime.   Yes Historical Provider, MD  divalproex (DEPAKOTE ER) 500 MG 24 hr tablet Take 1,000 mg by mouth at bedtime.   Yes Historical Provider, MD  divalproex (DEPAKOTE) 250 MG DR tablet Take 750 mg by mouth every morning.   Yes Historical Provider, MD  folic acid (FOLVITE) 1 MG tablet Take 2 mg by mouth daily.   Yes Historical Provider, MD  glycopyrrolate (ROBINUL) 1 MG tablet Take 1 mg by mouth 3 (three) times daily.   Yes Historical Provider, MD  haloperidol (HALDOL) 10 MG tablet Take 5-10 mg by mouth 3 (three) times daily. Take 5mg  in the morning, then take 10mg  by mouth at 2:00pm, and at bedtime.   Yes Historical Provider, MD  ipratropium (ATROVENT) 0.03 % nasal spray Place 2 sprays into both nostrils every 12 (twelve) hours.   Yes Historical Provider, MD  Multiple Vitamins-Minerals (THEREMS-M) TABS Take 1 tablet by mouth daily.   Yes Historical Provider, MD  sertraline (ZOLOFT) 100 MG tablet Take 100 mg by mouth at bedtime.   Yes Historical Provider, MD  Valproic Acid 250 MG CPDR Take 3-5 capsules by mouth 2 (two) times daily. Take 3 capsules in the morning and 5 capsules at bedtime.   Yes Historical Provider, MD    Physical Exam: Vitals:   03/09/16 2145 03/09/16 2200 03/09/16 2220 03/09/16 2230  BP:  113/85 113/85 127/88  Pulse: 68 68 66 67  Resp:  18 16 16   Temp:      TempSrc:      SpO2: 96% 97% 98% 98%  Weight:      Height:          Constitutional: NAD, lethargic  Eyes: PERTLA, lids and conjunctivae normal ENMT: Mucous membranes are dry. Posterior pharynx clear of any exudate or lesions.   Neck: normal, supple, no masses, no thyromegaly Respiratory: clear to auscultation bilaterally, no wheezing, no crackles. Normal respiratory effort.   Cardiovascular: S1 & S2 heard, regular rate and rhythm, no significant murmur. No extremity edema. 2+ pedal  pulses. No significant JVD. Abdomen: No distension, no tenderness, no masses palpated. Midline scar, colostomy in RLQ. Bowel sounds normal.  Musculoskeletal: no clubbing / cyanosis. No joint deformity upper and lower extremities. Normal muscle tone.  Skin: no significant rashes, lesions, ulcers. Warm, dry, well-perfused. Neurologic: No gross facial asymmetry, moving all extremities spontaneously, DTR's wnl, Babinski down-going b/l. PERRL.  Psychiatric: Difficult to assess given the clinical situation with lethargy.     Labs on Admission: I have personally reviewed following labs and imaging studies  CBC:  Recent Labs Lab 03/07/16 1415 03/09/16 1836  WBC 5.3 6.3  NEUTROABS  --  3.5  HGB 12.2* 12.1*  HCT 37.6* 37.2*  MCV 103.0* 103.0*  PLT 91* 50*   Basic Metabolic Panel:  Recent Labs Lab 03/07/16 1415 03/09/16 1836  NA 145 147*  K 4.4 4.5  CL 110 112*  CO2 28 27  GLUCOSE 82 89  BUN 26*  33*  CREATININE 1.64* 1.82*  CALCIUM 10.1 10.4*   GFR: Estimated Creatinine Clearance: 47.2 mL/min (by C-G formula based on SCr of 1.82 mg/dL). Liver Function Tests:  Recent Labs Lab 03/07/16 1415 03/09/16 1836  AST 27 32  ALT 18 23  ALKPHOS 71 68  BILITOT 0.4 0.6  PROT 6.4* 6.2*  ALBUMIN 3.3* 3.2*    Recent Labs Lab 03/07/16 1415 03/09/16 1836  LIPASE 51 41   No results for input(s): AMMONIA in the last 168 hours. Coagulation Profile: No results for input(s): INR, PROTIME in the last 168 hours. Cardiac Enzymes: No results for input(s): CKTOTAL, CKMB, CKMBINDEX, TROPONINI in the last 168 hours. BNP (last 3 results) No results for input(s): PROBNP in the last 8760 hours. HbA1C: No results for input(s): HGBA1C in the last 72 hours. CBG:  Recent Labs Lab 03/07/16 1414  GLUCAP 77   Lipid Profile: No results for input(s): CHOL, HDL, LDLCALC, TRIG, CHOLHDL, LDLDIRECT in the last 72 hours. Thyroid Function Tests: No results for input(s): TSH, T4TOTAL, FREET4,  T3FREE, THYROIDAB in the last 72 hours. Anemia Panel: No results for input(s): VITAMINB12, FOLATE, FERRITIN, TIBC, IRON, RETICCTPCT in the last 72 hours. Urine analysis:    Component Value Date/Time   COLORURINE YELLOW 03/07/2016 1409   APPEARANCEUR CLEAR 03/07/2016 1409   LABSPEC 1.020 03/07/2016 1409   PHURINE 6.0 03/07/2016 1409   GLUCOSEU NEGATIVE 03/07/2016 1409   HGBUR NEGATIVE 03/07/2016 1409   BILIRUBINUR NEGATIVE 03/07/2016 1409   KETONESUR NEGATIVE 03/07/2016 1409   PROTEINUR NEGATIVE 03/07/2016 1409   NITRITE NEGATIVE 03/07/2016 1409   LEUKOCYTESUR NEGATIVE 03/07/2016 1409   Sepsis Labs: @LABRCNTIP (procalcitonin:4,lacticidven:4) )No results found for this or any previous visit (from the past 240 hour(s)).   Radiological Exams on Admission: Ct Head Wo Contrast  Result Date: 03/09/2016 CLINICAL DATA:  Altered mental status. Decreased p.o. intake. Low platelets. EXAM: CT HEAD WITHOUT CONTRAST TECHNIQUE: Contiguous axial images were obtained from the base of the skull through the vertex without intravenous contrast. COMPARISON:  None. FINDINGS: Brain: There is mild cerebral atrophy which is advanced for age. There is no evidence of acute cortical infarct, intracranial hemorrhage, mass, midline shift, or extra-axial fluid collection. Vascular: No hyperdense vessel or unexpected calcification. Skull: Normal. Negative for fracture or focal lesion. Sinuses/Orbits: Right maxillary sinus mucous retention cysts. Minimal left maxillary sinus mucosal thickening. Clear mastoid air cells. Unremarkable orbits. Other: None. IMPRESSION: 1. No evidence of acute intracranial abnormality. 2. Premature cerebral atrophy. Electronically Signed   By: Logan Bores M.D.   On: 03/09/2016 21:41   Ct Abdomen Pelvis W Contrast  Result Date: 03/09/2016 CLINICAL DATA:  Acute onset of loss of appetite. Decreased hippocampal. Initial encounter. EXAM: CT ABDOMEN AND PELVIS WITH CONTRAST TECHNIQUE: Multidetector CT  imaging of the abdomen and pelvis was performed using the standard protocol following bolus administration of intravenous contrast. CONTRAST:  192mL ISOVUE-300 IOPAMIDOL (ISOVUE-300) INJECTION 61% COMPARISON:  CT of the abdomen and pelvis from 04/07/2015 FINDINGS: Lower chest: Minimal bibasilar atelectasis is noted. The visualized portions of the mediastinum are unremarkable. Hepatobiliary: The liver is grossly unremarkable in appearance. Stones are noted dependently within the gallbladder. The common bile duct remains normal in caliber. Pancreas: The pancreas is unremarkable in appearance. Spleen: The spleen is within normal limits. Adrenals/Urinary Tract: The adrenal glands are unremarkable in appearance. Small nonobstructing left renal stones are seen, measuring up to 2 mm in size. The kidneys are otherwise unremarkable. There is no evidence of hydronephrosis. No obstructing ureteral  stones are identified. No perinephric stranding is seen. Stomach/Bowel: The stomach is unremarkable in appearance. The small bowel is grossly unremarkable. A right lower quadrant colostomy is unremarkable in appearance. The patient's Hartmann's pouch is within normal limits. Vascular/Lymphatic: Minimal calcification is seen along the distal abdominal aorta and its branches. No acute vascular abnormalities are seen. There is partial decompression of the IVC, likely reflecting volume depletion. No retroperitoneal lymphadenopathy is seen. No pelvic sidewall lymphadenopathy is identified. Reproductive: The bladder is significantly distended and grossly unremarkable in appearance. The prostate remains normal in size. Musculoskeletal: No acute osseous abnormalities are identified. The visualized musculature is within normal limits. Vacuum phenomenon is noted at L5-S1. Other: Mild focal soft tissue inflammation is noted at the left lower back, overlying the left side of the sacrum, raising concern for a mild sacral decubitus ulceration.  IMPRESSION: 1. Mild focal soft tissue inflammation at the left lower back, overlying the left side of the sacrum, concerning for a mild sacral decubitus ulceration. Would correlate clinically. 2. Partial decompression of the IVC likely reflects volume depletion (dehydration). 3. Small nonobstructing left renal stones measure up to 2 mm in size. 4. Right lower quadrant colostomy is unremarkable. Hartmann's pouch is within normal limits. 5. Cholelithiasis.  Gallbladder otherwise grossly unremarkable. Electronically Signed   By: Garald Balding M.D.   On: 03/09/2016 21:45    EKG: Independently reviewed. Sinus rhythm, incomplete RBBB  Assessment/Plan  1. Lethargy, loss of appetite  - Uncertain etiology, mild Depakote toxicity considered given elevated drug level  - Head CT is negative for acute intracranial abnormality  - Plan to hold Depakote, TSH, B12, and folate, provide IVF hydration, encourage PO intake    2. AKI superimposed on CKD stage III  - SCr 1.82 on admission, up from 1.48 on 02/21/16 - Suspect this reflects a prerenal azotemia in setting of poor appetite and with decompressed IVC  - Given a 1 L NS bolus in ED, will continue NS at 150 cc/hr overnight  - Repeat chem panel in am    3. Thrombocytopenia  - Platelet count 50,000 on admission with no sign of bleeding  - Had been 53k last month, but previously wnl  - Other cell lines remain stable; no evidence for infectious process  - Could be secondary to Depakote, currently held as above  - Check folate and B12   4. Macrocytic anemia  - Hgb 12.1 with MCV 103.0 on admission; stable relative to prior CBC's - Continue folate supplementation  - Check folate, B12, and iron studies    5. Schizophrenia  - Difficult to assess on admission d/t his lethargy  - Has required inpatient treatment on multiple occasions and was incarcerated for murdering his grandfather  - Continue Clozaril, Haldol, Cogentin, Zoloft - Depakote held as above -  Check Clozaril level -   6. Hypertension - Elevated beyond goal at time of admission  - Managed with Norvasc and Coreg at home, will continue  - Labetalol IVP's available prn     DVT prophylaxis: SCD's  Code Status: Full  Family Communication: Discussed with patient  Disposition Plan: Observe on med-surg Consults called: None Admission status: Observation    Vianne Bulls, MD [facility] Pager [phone]  If 7PM-7AM, please contact night-coverage [email or web address] Password [email or web address]  03/09/2016, 11:22 PM

## 2016-03-09 NOTE — ED Notes (Signed)
Yellow socks and armband applied to pt.  Pt was trying to get out of bed, pt is near nurses station with curtains opened at this time. Seizure pads applied to side rails to help keep pt in bed.

## 2016-03-09 NOTE — ED Notes (Signed)
Patient sliding to end of bed ripping off cardiac leads, stating he needs to leave, witness by EDP Jacobiwitz, informed patient he needs CT before being discharged, given Ativan 1 mg for anxiety, and assisted back in bed.

## 2016-03-10 ENCOUNTER — Encounter (HOSPITAL_COMMUNITY): Payer: Self-pay

## 2016-03-10 DIAGNOSIS — D539 Nutritional anemia, unspecified: Secondary | ICD-10-CM | POA: Diagnosis present

## 2016-03-10 DIAGNOSIS — I129 Hypertensive chronic kidney disease with stage 1 through stage 4 chronic kidney disease, or unspecified chronic kidney disease: Secondary | ICD-10-CM | POA: Diagnosis present

## 2016-03-10 DIAGNOSIS — R4182 Altered mental status, unspecified: Secondary | ICD-10-CM | POA: Diagnosis not present

## 2016-03-10 DIAGNOSIS — I10 Essential (primary) hypertension: Secondary | ICD-10-CM | POA: Diagnosis not present

## 2016-03-10 DIAGNOSIS — E860 Dehydration: Secondary | ICD-10-CM | POA: Diagnosis not present

## 2016-03-10 DIAGNOSIS — T426X5A Adverse effect of other antiepileptic and sedative-hypnotic drugs, initial encounter: Secondary | ICD-10-CM | POA: Diagnosis present

## 2016-03-10 DIAGNOSIS — Z933 Colostomy status: Secondary | ICD-10-CM | POA: Diagnosis not present

## 2016-03-10 DIAGNOSIS — D696 Thrombocytopenia, unspecified: Secondary | ICD-10-CM | POA: Diagnosis not present

## 2016-03-10 DIAGNOSIS — N179 Acute kidney failure, unspecified: Secondary | ICD-10-CM | POA: Diagnosis not present

## 2016-03-10 DIAGNOSIS — R410 Disorientation, unspecified: Secondary | ICD-10-CM | POA: Diagnosis not present

## 2016-03-10 DIAGNOSIS — E870 Hyperosmolality and hypernatremia: Secondary | ICD-10-CM | POA: Diagnosis present

## 2016-03-10 DIAGNOSIS — R5383 Other fatigue: Secondary | ICD-10-CM | POA: Diagnosis not present

## 2016-03-10 DIAGNOSIS — F17200 Nicotine dependence, unspecified, uncomplicated: Secondary | ICD-10-CM | POA: Diagnosis present

## 2016-03-10 DIAGNOSIS — D6959 Other secondary thrombocytopenia: Secondary | ICD-10-CM | POA: Diagnosis present

## 2016-03-10 DIAGNOSIS — F250 Schizoaffective disorder, bipolar type: Secondary | ICD-10-CM | POA: Diagnosis not present

## 2016-03-10 DIAGNOSIS — K219 Gastro-esophageal reflux disease without esophagitis: Secondary | ICD-10-CM | POA: Diagnosis present

## 2016-03-10 DIAGNOSIS — G4700 Insomnia, unspecified: Secondary | ICD-10-CM | POA: Diagnosis present

## 2016-03-10 DIAGNOSIS — N183 Chronic kidney disease, stage 3 (moderate): Secondary | ICD-10-CM | POA: Diagnosis not present

## 2016-03-10 LAB — BLOOD GAS, ARTERIAL
Acid-Base Excess: 0.3 mmol/L (ref 0.0–2.0)
Bicarbonate: 24.3 mmol/L (ref 20.0–28.0)
FIO2: 21
O2 Saturation: 94.4 %
PO2 ART: 78.1 mmHg — AB (ref 83.0–108.0)
pCO2 arterial: 45.3 mmHg (ref 32.0–48.0)
pH, Arterial: 7.361 (ref 7.350–7.450)

## 2016-03-10 LAB — BASIC METABOLIC PANEL
ANION GAP: 7 (ref 5–15)
ANION GAP: 7 (ref 5–15)
BUN: 30 mg/dL — ABNORMAL HIGH (ref 6–20)
BUN: 28 mg/dL — ABNORMAL HIGH (ref 6–20)
BUN: 28 mg/dL — ABNORMAL HIGH (ref 6–20)
CALCIUM: 9.4 mg/dL (ref 8.9–10.3)
CHLORIDE: 119 mmol/L — AB (ref 101–111)
CO2: 25 mmol/L (ref 22–32)
CO2: 25 mmol/L (ref 22–32)
CO2: 24 mmol/L (ref 22–32)
Calcium: 10 mg/dL (ref 8.9–10.3)
Calcium: 9.8 mg/dL (ref 8.9–10.3)
Chloride: 121 mmol/L — ABNORMAL HIGH (ref 101–111)
Chloride: 119 mmol/L — ABNORMAL HIGH (ref 101–111)
Creatinine, Ser: 1.49 mg/dL — ABNORMAL HIGH (ref 0.61–1.24)
Creatinine, Ser: 1.52 mg/dL — ABNORMAL HIGH (ref 0.61–1.24)
Creatinine, Ser: 1.51 mg/dL — ABNORMAL HIGH (ref 0.61–1.24)
GFR calc non Af Amer: 53 mL/min — ABNORMAL LOW (ref >60)
GFR, EST NON AFRICAN AMERICAN: 53 mL/min — AB (ref >60)
GFR, EST NON AFRICAN AMERICAN: 52 mL/min — AB (ref >60)
GLUCOSE: 81 mg/dL (ref 65–99)
Glucose, Bld: 75 mg/dL (ref 65–99)
Glucose, Bld: 82 mg/dL (ref 65–99)
POTASSIUM: 4.2 mmol/L (ref 3.5–5.1)
POTASSIUM: 4.4 mmol/L (ref 3.5–5.1)
POTASSIUM: 4 mmol/L (ref 3.5–5.1)
SODIUM: 151 mmol/L — AB (ref 135–145)
Sodium: 148 mmol/L — ABNORMAL HIGH (ref 135–145)
Sodium: 150 mmol/L — ABNORMAL HIGH (ref 135–145)

## 2016-03-10 LAB — VITAMIN B12: VITAMIN B 12: 1533 pg/mL — AB (ref 180–914)

## 2016-03-10 LAB — IRON AND TIBC
Iron: 80 ug/dL (ref 45–182)
SATURATION RATIOS: 33 % (ref 17.9–39.5)
TIBC: 239 ug/dL — ABNORMAL LOW (ref 250–450)
UIBC: 159 ug/dL

## 2016-03-10 LAB — CBC WITH DIFFERENTIAL/PLATELET
BASOS ABS: 0 10*3/uL (ref 0.0–0.1)
Basophils Relative: 0 %
EOS PCT: 1 %
Eosinophils Absolute: 0.1 10*3/uL (ref 0.0–0.7)
HCT: 34.8 % — ABNORMAL LOW (ref 39.0–52.0)
Hemoglobin: 11.3 g/dL — ABNORMAL LOW (ref 13.0–17.0)
Lymphocytes Relative: 28 %
Lymphs Abs: 1.7 10*3/uL (ref 0.7–4.0)
MCH: 33.7 pg (ref 26.0–34.0)
MCHC: 32.5 g/dL (ref 30.0–36.0)
MCV: 103.9 fL — AB (ref 78.0–100.0)
MONO ABS: 1.2 10*3/uL — AB (ref 0.1–1.0)
MONOS PCT: 19 %
Neutro Abs: 3.2 10*3/uL (ref 1.7–7.7)
Neutrophils Relative %: 52 %
PLATELETS: 43 10*3/uL — AB (ref 150–400)
RBC: 3.35 MIL/uL — ABNORMAL LOW (ref 4.22–5.81)
RDW: 15.6 % — AB (ref 11.5–15.5)
WBC: 6.2 10*3/uL (ref 4.0–10.5)

## 2016-03-10 LAB — MRSA PCR SCREENING: MRSA BY PCR: POSITIVE — AB

## 2016-03-10 LAB — TSH: TSH: 2.945 u[IU]/mL (ref 0.350–4.500)

## 2016-03-10 LAB — GLUCOSE, CAPILLARY: GLUCOSE-CAPILLARY: 76 mg/dL (ref 65–99)

## 2016-03-10 LAB — FERRITIN: Ferritin: 594 ng/mL — ABNORMAL HIGH (ref 24–336)

## 2016-03-10 LAB — AMMONIA: AMMONIA: 13 umol/L (ref 9–35)

## 2016-03-10 MED ORDER — HALOPERIDOL 5 MG PO TABS
10.0000 mg | ORAL_TABLET | Freq: Every day | ORAL | Status: DC
  Administered 2016-03-10 – 2016-03-15 (×6): 10 mg via ORAL
  Filled 2016-03-10 (×7): qty 2

## 2016-03-10 MED ORDER — HALOPERIDOL 5 MG PO TABS
10.0000 mg | ORAL_TABLET | ORAL | Status: DC
  Administered 2016-03-13 – 2016-03-15 (×3): 10 mg via ORAL
  Filled 2016-03-10 (×4): qty 2

## 2016-03-10 MED ORDER — MUPIROCIN 2 % EX OINT
1.0000 "application " | TOPICAL_OINTMENT | Freq: Two times a day (BID) | CUTANEOUS | Status: AC
  Administered 2016-03-10 – 2016-03-14 (×9): 1 via NASAL
  Filled 2016-03-10: qty 22

## 2016-03-10 MED ORDER — CHLORHEXIDINE GLUCONATE CLOTH 2 % EX PADS
6.0000 | MEDICATED_PAD | Freq: Every day | CUTANEOUS | Status: AC
  Administered 2016-03-10 – 2016-03-14 (×2): 6 via TOPICAL

## 2016-03-10 MED ORDER — SODIUM CHLORIDE 0.45 % IV SOLN
INTRAVENOUS | Status: DC
  Administered 2016-03-10 – 2016-03-12 (×2): via INTRAVENOUS

## 2016-03-10 MED ORDER — CLOZAPINE 25 MG PO TABS
150.0000 mg | ORAL_TABLET | Freq: Every day | ORAL | Status: DC
  Filled 2016-03-10 (×4): qty 2

## 2016-03-10 MED ORDER — CLOZAPINE 100 MG PO TABS
500.0000 mg | ORAL_TABLET | Freq: Every day | ORAL | Status: DC
  Administered 2016-03-10: 500 mg via ORAL
  Filled 2016-03-10 (×3): qty 5

## 2016-03-10 NOTE — Care Management Note (Addendum)
Case Management Note  Patient Details  Name: Mario Ortega MRN: [id number] Date of Birth: [birth year]  Subjective/Objective:     Patient is from [facility], adm with thrombocytopenia. Plan is for him to return to [facility] when discharged. Spoke with his mother today regarding observation status.                Action/Plan: CSW following and will make arrangements for return to [facility]. Will Follow.    Expected Discharge Date:        03/12/2016          Expected Discharge Plan:  Assisted Living / Rest Home  In-House Referral:  Clinical Social Work  Discharge planning Services  CM Consult  Post Acute Care Choice:  NA Choice offered to:  NA  DME Arranged:    DME Agency:     HH Arranged:    HH Agency:     Status of Service:  In process, will continue to follow  If discussed at Long Length of Stay Meetings, dates discussed:    Additional Comments:  Purl Claytor, [name], RN 03/10/2016, 9:38 AM

## 2016-03-10 NOTE — Progress Notes (Signed)
[facility] Clozapine program Update: Patient & MD registered; Labs entered and acceptable.  Per Clozapine [facility] program, acceptable to continue medication.

## 2016-03-10 NOTE — Progress Notes (Signed)
PROGRESS NOTE    Mario Ortega  [id number] DOB: [birth year] DOA: 03/09/2016 PCP: Christie Nottingham., PA    Brief Narrative:  Mario Ortega is a 50 y.o. male with medical history significant for schizophrenia, chronic kidney disease stage III, hypertension, and macrocytic anemia who presents to the emergency department from his nursing facility for evaluation of low platelets and lethargy. Patient had reportedly been in his usual state until approximately 3 days ago when he was noted to be more lethargic and not eating. He was evaluated in the emergency department 2 days ago for this, noted to have thrombocytopenia, but was otherwise stable, had no complaints himself, and was returned to his facility. Since his return, his lethargy is increased and he continues to refuse food. He has denied recent fevers, chills, chest pain, palpitations, dyspnea, or cough. He is also denied abdominal pain. Upon arrival to the ED, patient is found to be afebrile, saturating well on room air, hypertensive in the 140/100 range, and vitals otherwise stable. EKG demonstrates a sinus rhythm with incomplete right bundle branch block and noncontrast head CT is negative for acute intracranial abnormality. Chemistry panels notable for a hypernatremia, hyperchloremia, and serum creatinine 1.82, up from 1.48 two weeks prior. Valproic acid level is elevated to 112. Patient became agitated in the emergency department and was treated with a dose of IV Ativan. CT of the abdomen and pelvis was obtained and there is a decompressed IVC consistent with dehydration and no emergent findings. Patient was given a 1 L normal saline bolus and continued on an aggressive IV hydration with 250 cc/hr. he remained hemodynamically stable in the ED and in no respiratory distress.  Assessment & Plan:   Principal Problem:   Thrombocytopenia ([place]) Active Problems:   HTN (hypertension)   GERD (gastroesophageal reflux disease)   Schizophrenia  (HCC)   AKI (acute kidney injury) ([place])   CKD (chronic kidney disease), stage III   Lethargy   Dehydration   Lethargy, loss of appetite  - Uncertain etiology, mild Depakote toxicity considered given elevated drug level  - Head CT is negative for acute intracranial abnormality  - Plan to hold Depakote -TSH, B12, both normal - provide IVF hydration - Blood cultures x 2    Hypernatremia - Na at 151 - will redraw in 4 hours from last draw - change IVF to 83ml/hr of 0.5NS - redraw BMP in am  AKI superimposed on CKD stage III  - SCr 1.52 today - improved - Repeat chem panel in am    Thrombocytopenia  - Platelet count 43,000 - No signs of bleeding - Had been 53k last month, but previously wnl  - Other cell lines remain stable; no evidence for infectious process  - Could be secondary to Depakote, currently held as above     Macrocytic anemia  - Hgb 12.1 with MCV 103.0 on admission; stable relative to prior CBC's - Continue folate supplementation  - all WNL or elevated except for TIBC which is slightly depressed  Schizophrenia  - Difficult to assess on admission d/t his lethargy  - Has required inpatient treatment on multiple occasions and was incarcerated for murdering his grandfather  - Continue Clozaril, Haldol, Cogentin, Zoloft however patient not able to tolerate PO at this time - Depakote held as above - Check Clozaril level - pending?  Hypertension - Elevated beyond goal at time of admission  - Managed with Norvasc and Coreg at home, will continue  - Labetalol IVP's available prn  DVT prophylaxis: SCD's Code Status: Full Family Communication: No family bedside, patient not alert enough to discuss calling family Disposition Plan: will discharge back to [facility] when medically stable and improved, at this time it is unclear when patient will be ready for discharge- anticipate at least another 24-48 hours   Consultants:   None  Procedures:    None  Antimicrobials:   None    Subjective: Patient drowsy but able to follow commands at time of evaluation.  He states he is missing his dog.  He is redirectable and knows he is in the hospital but is unable to state which hospital.  Says it is 4.  Does not know why he is here.  Appears to fall asleep frequently through exam but is just staring.  No further information able to be gleaned from patient.  Objective: Vitals:   03/09/16 2330 03/10/16 0000 03/10/16 0506 03/10/16 1511  BP: 141/97 136/88 136/86 (!) 143/89  Pulse: 72 70 66 69  Resp: 20 18  16   Temp:  99 F (37.2 C) 97.5 F (36.4 C) 97.9 F (36.6 C)  TempSrc:  Axillary Axillary Axillary  SpO2: 100% 100% 98% 100%  Weight:  79.4 kg (175 lb 1.6 oz) 79.2 kg (174 lb 9.6 oz)   Height:  5\' 11"  (1.803 m) 5\' 11"  (1.803 m)     Intake/Output Summary (Last 24 hours) at 03/10/16 1805 Last data filed at 03/10/16 1500  Gross per 24 hour  Intake          1478.33 ml  Output                3 ml  Net          1475.33 ml   Filed Weights   03/09/16 2032 03/10/16 0000 03/10/16 0506  Weight: 68 kg (150 lb) 79.4 kg (175 lb 1.6 oz) 79.2 kg (174 lb 9.6 oz)    Examination:  General exam: Appears calm  Respiratory system: Clear to auscultation. Respiratory effort normal. Cardiovascular system: S1 & S2 heard, RRR. No JVD, murmurs, rubs, gallops or clicks. No pedal edema. Gastrointestinal system: Abdomen is nondistended, soft and nontender; colostomy present in right lower quadrant with normal appearing stool and not blood in stool noted. Large midline scar noted Central nervous system: oriented to person. Unable to perform thorough neurologic exam- patient PERRL, able to puff cheeks out bilaterally, can shrug shoulders bilaterally, hand grip intact and equal bilaterally, able to move upper and lower extremities bilaterally Extremities: Symmetric 5 x 5 power- able to move arms and legs bilaterally without focal deficits Skin: No  rashes, lesions or ulcers; large midline scar noted on abdomen Psychiatry: unable to assess as patient is minimally communicative     Data Reviewed: I have personally reviewed following labs and imaging studies  CBC:  Recent Labs Lab 03/07/16 1415 03/09/16 1836 03/10/16 0636  WBC 5.3 6.3 6.2  NEUTROABS  --  3.5 3.2  HGB 12.2* 12.1* 11.3*  HCT 37.6* 37.2* 34.8*  MCV 103.0* 103.0* 103.9*  PLT 91* 50* 43*   Basic Metabolic Panel:  Recent Labs Lab 03/07/16 1415 03/09/16 1836 03/10/16 0636 03/10/16 1703  NA 145 147* 148* 151*  K 4.4 4.5 4.2 4.4  CL 110 112* 121* 119*  CO2 28 27 25 25   GLUCOSE 82 89 75 82  BUN 26* 33* 30* 28*  CREATININE 1.64* 1.82* 1.49* 1.52*  CALCIUM 10.1 10.4* 9.4 10.0   GFR: Estimated Creatinine Clearance: 62.6 mL/min (by  C-G formula based on SCr of 1.52 mg/dL). Liver Function Tests:  Recent Labs Lab 03/07/16 1415 03/09/16 1836  AST 27 32  ALT 18 23  ALKPHOS 71 68  BILITOT 0.4 0.6  PROT 6.4* 6.2*  ALBUMIN 3.3* 3.2*    Recent Labs Lab 03/07/16 1415 03/09/16 1836  LIPASE 51 41    Recent Labs Lab 03/10/16 1703  AMMONIA 13   Coagulation Profile: No results for input(s): INR, PROTIME in the last 168 hours. Cardiac Enzymes: No results for input(s): CKTOTAL, CKMB, CKMBINDEX, TROPONINI in the last 168 hours. BNP (last 3 results) No results for input(s): PROBNP in the last 8760 hours. HbA1C: No results for input(s): HGBA1C in the last 72 hours. CBG:  Recent Labs Lab 03/07/16 1414 03/10/16 0823  GLUCAP 77 76   Lipid Profile: No results for input(s): CHOL, HDL, LDLCALC, TRIG, CHOLHDL, LDLDIRECT in the last 72 hours. Thyroid Function Tests:  Recent Labs  03/09/16 1836  TSH 2.945   Anemia Panel:  Recent Labs  03/10/16 0636  VITAMINB12 1,533*  FERRITIN 594*  TIBC 239*  IRON 80   Sepsis Labs: No results for input(s): PROCALCITON, LATICACIDVEN in the last 168 hours.  Recent Results (from the past 240 hour(s))   MRSA PCR Screening     Status: Abnormal   Collection Time: 03/10/16  1:15 AM  Result Value Ref Range Status   MRSA by PCR POSITIVE (A) NEGATIVE Final    Comment:        The GeneXpert MRSA Assay (FDA approved for NASAL specimens only), is one component of a comprehensive MRSA colonization surveillance program. It is not intended to diagnose MRSA infection nor to guide or monitor treatment for MRSA infections. RESULT CALLED TO, READ BACK BY AND VERIFIED WITH: ROSE K AT 0333 ON [id number] BY [facility] K          Radiology Studies: Ct Head Wo Contrast  Result Date: 03/09/2016 CLINICAL DATA:  Altered mental status. Decreased p.o. intake. Low platelets. EXAM: CT HEAD WITHOUT CONTRAST TECHNIQUE: Contiguous axial images were obtained from the base of the skull through the vertex without intravenous contrast. COMPARISON:  None. FINDINGS: Brain: There is mild cerebral atrophy which is advanced for age. There is no evidence of acute cortical infarct, intracranial hemorrhage, mass, midline shift, or extra-axial fluid collection. Vascular: No hyperdense vessel or unexpected calcification. Skull: Normal. Negative for fracture or focal lesion. Sinuses/Orbits: Right maxillary sinus mucous retention cysts. Minimal left maxillary sinus mucosal thickening. Clear mastoid air cells. Unremarkable orbits. Other: None. IMPRESSION: 1. No evidence of acute intracranial abnormality. 2. Premature cerebral atrophy. Electronically Signed   By: Logan Bores M.D.   On: 03/09/2016 21:41   Ct Abdomen Pelvis W Contrast  Result Date: 03/09/2016 CLINICAL DATA:  Acute onset of loss of appetite. Decreased hippocampal. Initial encounter. EXAM: CT ABDOMEN AND PELVIS WITH CONTRAST TECHNIQUE: Multidetector CT imaging of the abdomen and pelvis was performed using the standard protocol following bolus administration of intravenous contrast. CONTRAST:  121mL ISOVUE-300 IOPAMIDOL (ISOVUE-300) INJECTION 61% COMPARISON:  CT of the abdomen  and pelvis from 04/07/2015 FINDINGS: Lower chest: Minimal bibasilar atelectasis is noted. The visualized portions of the mediastinum are unremarkable. Hepatobiliary: The liver is grossly unremarkable in appearance. Stones are noted dependently within the gallbladder. The common bile duct remains normal in caliber. Pancreas: The pancreas is unremarkable in appearance. Spleen: The spleen is within normal limits. Adrenals/Urinary Tract: The adrenal glands are unremarkable in appearance. Small nonobstructing left renal stones are seen, measuring  up to 2 mm in size. The kidneys are otherwise unremarkable. There is no evidence of hydronephrosis. No obstructing ureteral stones are identified. No perinephric stranding is seen. Stomach/Bowel: The stomach is unremarkable in appearance. The small bowel is grossly unremarkable. A right lower quadrant colostomy is unremarkable in appearance. The patient's Hartmann's pouch is within normal limits. Vascular/Lymphatic: Minimal calcification is seen along the distal abdominal aorta and its branches. No acute vascular abnormalities are seen. There is partial decompression of the IVC, likely reflecting volume depletion. No retroperitoneal lymphadenopathy is seen. No pelvic sidewall lymphadenopathy is identified. Reproductive: The bladder is significantly distended and grossly unremarkable in appearance. The prostate remains normal in size. Musculoskeletal: No acute osseous abnormalities are identified. The visualized musculature is within normal limits. Vacuum phenomenon is noted at L5-S1. Other: Mild focal soft tissue inflammation is noted at the left lower back, overlying the left side of the sacrum, raising concern for a mild sacral decubitus ulceration. IMPRESSION: 1. Mild focal soft tissue inflammation at the left lower back, overlying the left side of the sacrum, concerning for a mild sacral decubitus ulceration. Would correlate clinically. 2. Partial decompression of the IVC  likely reflects volume depletion (dehydration). 3. Small nonobstructing left renal stones measure up to 2 mm in size. 4. Right lower quadrant colostomy is unremarkable. Hartmann's pouch is within normal limits. 5. Cholelithiasis.  Gallbladder otherwise grossly unremarkable. Electronically Signed   By: Garald Balding M.D.   On: 03/09/2016 21:45        Scheduled Meds: . amLODipine  10 mg Oral QHS  . benztropine  1 mg Oral BID  . carvedilol  25 mg Oral BID WC  . Chlorhexidine Gluconate Cloth  6 each Topical Q0600  . cholecalciferol  2,000 Units Oral Daily  . cloZAPine  150 mg Oral Daily  . clozapine  500 mg Oral QHS  . desmopressin  0.4 mg Oral QHS  . folic acid  2 mg Oral Daily  . glycopyrrolate  1 mg Oral TID  . haloperidol  10 mg Oral Daily  . haloperidol  10 mg Oral QHS  . haloperidol  5 mg Oral Q breakfast  . ipratropium  2 spray Each Nare Q12H  . multivitamin with minerals   Oral Daily  . mupirocin ointment  1 application Nasal BID  . sertraline  100 mg Oral QHS   Continuous Infusions: . sodium chloride 50 mL/hr at 03/10/16 1649     LOS: 0 days    Time spent: 35 minutes    Newman Pies, MD [facility] Pager [phone]  If 7PM-7AM, please contact night-coverage [email or web address] Password [email or web address] 03/10/2016, 6:05 PM

## 2016-03-10 NOTE — Progress Notes (Signed)
Patient remains lethargic and unresponsive to voice.  Grimaces and pulls away slightly with touch. Unable to give any PO meds at this time.

## 2016-03-10 NOTE — Progress Notes (Signed)
Patient more alert this afternoon, remains confused.  Getting OOB frequently.  Takes reorientation and prompting to get back to bed.  Very unsteady on feet.  Fall mat, socks, and band all in place as well as bed alarm.  Notified AC for need for Air cabin crew.  MD also aware.  Attempted to give PO meds this evening.  Patient swallowed water and small amount of juice with out difficulty, but would spit medicine out.  Will continue to monitor.

## 2016-03-10 NOTE — Clinical Social Work Note (Signed)
Clinical Social Work Assessment  Patient Details  Name: Mario Ortega MRN: [id number] Date of Birth: February 25, 1966  Date of referral:  03/10/16               Reason for consult:  Discharge Planning                Permission sought to share information with:    Permission granted to share information::     Name::        Agency::     Relationship::     Contact Information:     Housing/Transportation Living arrangements for the past 2 months:  [place] of Information:  Parent, Facility Patient Interpreter Needed:  None Criminal Activity/Legal Involvement Pertinent to Current Situation/Hospitalization:  No - Comment as needed Significant Relationships:  Other(Comment) (Mother minimally involved due to living out of town and own health problems. ) Lives with:  Facility Resident Do you feel safe going back to the place where you live?  Yes Need for family participation in patient care:  Yes (Comment)  Care giving concerns:  None reported. Pt is resident at group home.    Social Worker assessment / plan:  CSW attempted to meet with pt at bedside, but pt sleeping soundly and per chart is not responding to commands. CSW spoke with Mario Ortega, supervisor-in-charge at [facility]. Mario Ortega reports pt has been a resident there for a little over a month. He was fairly independent at the beginning, but recently has been very lethargic and not eating much or interacting. Pt is very social at baseline. Yesterday, his speech was slurred and they brought pt to ED for evaluation. He ambulates independently. Pt has home health through [facility] for lab draws. He has a colostomy. Okay to return to facility. CSW spoke with pt's mother, Mario Ortega on phone. She shared that she was not aware of hospital admission. Mario Ortega lives in [place] and indicates that she has not seen pt much recently due to her own health problems.   Employment status:  Disabled (Comment on whether or not currently  receiving Disability) Insurance information:  Medicare PT Recommendations:  Not assessed at this time Information / Referral to community resources:  Other (Comment Required) (Return to [facility].)  Patient/Family's Response to care:  Pt's mother requests return to [facility] when medically stable.   Patient/Family's Understanding of and Emotional Response to Diagnosis, Current Treatment, and Prognosis:  Pt's mother appears to have limited knowledge of pt's medical history due to living out of town and her own health issues.   Emotional Assessment Appearance:  Appears stated age Attitude/Demeanor/Rapport:  Unable to Assess Affect (typically observed):  Unable to Assess Orientation:    Alcohol / Substance use:  Not Applicable Psych involvement (Current and /or in the community):  No (Comment)  Discharge Needs  Concerns to be addressed:  Discharge Planning Concerns Readmission within the last 30 days:  No Current discharge risk:  None Barriers to Discharge:  Continued Medical Work up   [name], [place] 03/10/2016, 9:06 AM [phone]

## 2016-03-10 NOTE — Care Management Obs Status (Signed)
[place] NOTIFICATION   Patient Details  Name: Mario Ortega MRN: [id number] Date of Birth: 08/15/1965   Medicare Observation Status Notification Given:  Yes    Meriem Lemieux, [name], RN 03/10/2016, 9:37 AM

## 2016-03-11 DIAGNOSIS — R4182 Altered mental status, unspecified: Secondary | ICD-10-CM | POA: Insufficient documentation

## 2016-03-11 DIAGNOSIS — R410 Disorientation, unspecified: Secondary | ICD-10-CM | POA: Diagnosis not present

## 2016-03-11 LAB — CBC WITH DIFFERENTIAL/PLATELET
Basophils Absolute: 0 10*3/uL (ref 0.0–0.1)
Basophils Relative: 0 %
EOS PCT: 2 %
Eosinophils Absolute: 0.1 10*3/uL (ref 0.0–0.7)
HCT: 35.5 % — ABNORMAL LOW (ref 39.0–52.0)
HEMOGLOBIN: 11.4 g/dL — AB (ref 13.0–17.0)
LYMPHS ABS: 2.1 10*3/uL (ref 0.7–4.0)
LYMPHS PCT: 30 %
MCH: 33.3 pg (ref 26.0–34.0)
MCHC: 32.1 g/dL (ref 30.0–36.0)
MCV: 103.8 fL — AB (ref 78.0–100.0)
Monocytes Absolute: 1.4 10*3/uL — ABNORMAL HIGH (ref 0.1–1.0)
Monocytes Relative: 21 %
NEUTROS PCT: 47 %
Neutro Abs: 3.2 10*3/uL (ref 1.7–7.7)
Platelets: 32 10*3/uL — ABNORMAL LOW (ref 150–400)
RBC: 3.42 MIL/uL — AB (ref 4.22–5.81)
RDW: 15.2 % (ref 11.5–15.5)
WBC: 6.9 10*3/uL (ref 4.0–10.5)

## 2016-03-11 LAB — BASIC METABOLIC PANEL
ANION GAP: 2 — AB (ref 5–15)
Anion gap: 5 (ref 5–15)
BUN: 26 mg/dL — AB (ref 6–20)
BUN: 24 mg/dL — ABNORMAL HIGH (ref 6–20)
CALCIUM: 10 mg/dL (ref 8.9–10.3)
CHLORIDE: 119 mmol/L — AB (ref 101–111)
CHLORIDE: 120 mmol/L — AB (ref 101–111)
CO2: 26 mmol/L (ref 22–32)
CO2: 27 mmol/L (ref 22–32)
CREATININE: 1.46 mg/dL — AB (ref 0.61–1.24)
Calcium: 9.6 mg/dL (ref 8.9–10.3)
Creatinine, Ser: 1.41 mg/dL — ABNORMAL HIGH (ref 0.61–1.24)
GFR calc non Af Amer: 57 mL/min — ABNORMAL LOW (ref >60)
GFR, EST NON AFRICAN AMERICAN: 55 mL/min — AB (ref >60)
Glucose, Bld: 78 mg/dL (ref 65–99)
Glucose, Bld: 133 mg/dL — ABNORMAL HIGH (ref 65–99)
Potassium: 3.9 mmol/L (ref 3.5–5.1)
Potassium: 3.6 mmol/L (ref 3.5–5.1)
SODIUM: 150 mmol/L — AB (ref 135–145)
Sodium: 149 mmol/L — ABNORMAL HIGH (ref 135–145)

## 2016-03-11 LAB — SAVE SMEAR

## 2016-03-11 LAB — GLUCOSE, CAPILLARY
Glucose-Capillary: 70 mg/dL (ref 65–99)
Glucose-Capillary: 87 mg/dL (ref 65–99)

## 2016-03-11 MED ORDER — CLOZAPINE 25 MG PO TABS
75.0000 mg | ORAL_TABLET | Freq: Every day | ORAL | Status: DC
  Administered 2016-03-11 – 2016-03-16 (×6): 75 mg via ORAL
  Filled 2016-03-11 (×8): qty 3

## 2016-03-11 MED ORDER — CLOZAPINE 25 MG PO TABS
250.0000 mg | ORAL_TABLET | Freq: Every day | ORAL | Status: DC
  Administered 2016-03-11 – 2016-03-15 (×5): 250 mg via ORAL
  Filled 2016-03-11 (×6): qty 2

## 2016-03-11 MED ORDER — IMMUNE GLOBULIN (HUMAN) 20 GM/200ML IV SOLN
1.0000 g/kg | INTRAVENOUS | Status: AC
Start: 2016-03-11 — End: 2016-03-11
  Administered 2016-03-11: 80 g via INTRAVENOUS
  Filled 2016-03-11: qty 800

## 2016-03-11 NOTE — Progress Notes (Signed)
Patient seen by tele-psychiatry for medication recommendations. He appears confused during the assessment. On several occasions he repeated his name several times. Amitai identified the year as "1998". When asked regular psychiatric assessment questions the patient responded by talking about past nicknames such as "wildman". The patient was unable to participate in the psychiatric assessment. Dr. Dwyane Dee was consulted for medication recommendations who also discussed case with Dr. Jearld Shines. Dr. Dwyane Dee suggests discontinuing the Depakote due to his low platelet count. Dr. Jearld Shines also requesting assistance with decreasing his Clozaril due to concern for sedation, which has already been decreased at [place]. Dr. Dwyane Dee recommends increasing the haldol to 10 mg bid to manage symptoms of agitation as other reasons for his abnormal blood-work are explored.

## 2016-03-11 NOTE — Progress Notes (Addendum)
PROGRESS NOTE    Mario Ortega  [id number] DOB: 02-03-1966 DOA: 03/09/2016 PCP: Christie Nottingham., PA    Brief Narrative:  Mario Ortega is a 50 y.o. male with medical history significant for schizophrenia, chronic kidney disease stage III, hypertension, and macrocytic anemia who presents to the emergency department from his nursing facility for evaluation of low platelets and lethargy. Patient had reportedly been in his usual state until approximately 3 days ago when he was noted to be more lethargic and not eating. He was evaluated in the emergency department 2 days ago for this, noted to have thrombocytopenia, but was otherwise stable, had no complaints himself, and was returned to his facility. Since his return, his lethargy is increased and he continues to refuse food. He has denied recent fevers, chills, chest pain, palpitations, dyspnea, or cough. He is also denied abdominal pain. Upon arrival to the ED, patient is found to be afebrile, saturating well on room air, hypertensive in the 140/100 range, and vitals otherwise stable. EKG demonstrates a sinus rhythm with incomplete right bundle branch block and noncontrast head CT is negative for acute intracranial abnormality. Chemistry panels notable for a hypernatremia, hyperchloremia, and serum creatinine 1.82, up from 1.48 two weeks prior. Valproic acid level is elevated to 112. Patient became agitated in the emergency department and was treated with a dose of IV Ativan. CT of the abdomen and pelvis was obtained and there is a decompressed IVC consistent with dehydration and no emergent findings. Patient was given a 1 L normal saline bolus and continued on an aggressive IV hydration with 250 cc/hr. he remained hemodynamically stable in the ED and in no respiratory distress.  Assessment & Plan:   Principal Problem:   Thrombocytopenia ([place]) Active Problems:   HTN (hypertension)   GERD (gastroesophageal reflux disease)   Schizophrenia  (HCC)   AKI (acute kidney injury) ([place])   CKD (chronic kidney disease), stage III   Lethargy   Dehydration   Lethargy, loss of appetite  - Uncertain etiology, mild Depakote toxicity considered given elevated drug level  - Head CT is negative for acute intracranial abnormality  - Plan to hold Depakote -TSH, B12, both normal - provide IVF hydration - Blood cultures x 2- NG <12 hours - ABG was mostly within normal limits (slightly low O2) - ammonia level WNL - UA WNL - slightly less lethargic today, able to drink fluids and respond to some commands   Hypernatremia - Na at 150 this am - continue IVF to 62ml/hr of 0.5NS - redraw BMP in afternoon - attempt to encourage PO intake  Thrombocytopenia  - Platelet count 32,000 - No signs of bleeding - Had been 53k last month, but previously wnl  - Other cell lines remain stable; no evidence for infectious process  - Could be secondary to Depakote or Clozapine - Plan to slowly discontine/ taper Clozapine - Will draw serial CBCDs to monitor platelet count- if continues to drop will need to d/c Clozapine although abrupt stop would not be ideal  - Discussed with Dr. Whitney Muse- question of ITP - will give IVIG at dose of 1g/kg for 2 days (will reassess prior to reordering tomorrow) - if platelet count goes up after treatment with IVIG then ITP (or immune mediated thrombocytopenia) likely  AKI superimposed on CKD stage III  - SCr 1.46 today - continues to improve - Repeat chem panel in pm and then qAM     Macrocytic anemia  - Hgb 11.3 with MCV 103.0 on  admission; stable relative to prior CBC's - Continue folate supplementation   Schizophrenia  - Difficult to assess on admission d/t his lethargy  - Has required inpatient treatment on multiple occasions and was incarcerated for murdering his grandfather  - Continue Haldol, Cogentin, Zoloft as patient tolerates - slowly taper Clozaril to discontinue - Depakote held as above - TTS  consult placed- medication management guidance appreciated  Hypertension - Elevated beyond goal at time of admission  - Managed with Norvasc and Coreg at home, will continue  - Labetalol IVP's available prn    DVT prophylaxis: SCD's Code Status: Full Family Communication: Called patient's mother Mario Ortega on patient's facesheet and left message I would call back Disposition Plan: will discharge back to [facility] when medically stable and improved, at this time it is unclear when patient will be ready for discharge- anticipate at least another 48- 72 hours   Consultants:   None  Procedures:   None  Antimicrobials:   None    Subjective: Patient fluctuating between alert and oriented x 2 to asleep during exam.  Became somewhat agitated when asked if his mother could be called.  Stated "I'm tired of this. She can come and see me or not come and see me but I'm not letting her jerk me around anymore".  Patient states he is hungry and that he needs his medication.  He reports no pain.  Is able to follow commands if re- iterated numerous times.  Patient reports that he does not have chest pain, shortness of breath, abdominal pain, pain in extremities.  Possible wound noted near sacral area on CT not appreciated by nursing staff during bath.  Objective: Vitals:   03/10/16 0000 03/10/16 0506 03/10/16 1511 03/10/16 2310  BP: 136/88 136/86 (!) 143/89 132/77  Pulse: 70 66 69 70  Resp: 18  16 18   Temp: 99 F (37.2 C) 97.5 F (36.4 C) 97.9 F (36.6 C) 98 F (36.7 C)  TempSrc: Axillary Axillary Axillary Oral  SpO2: 100% 98% 100% 100%  Weight: 79.4 kg (175 lb 1.6 oz) 79.2 kg (174 lb 9.6 oz)    Height: 5\' 11"  (1.803 m) 5\' 11"  (1.803 m)      Intake/Output Summary (Last 24 hours) at 03/11/16 0742 Last data filed at 03/10/16 2310  Gross per 24 hour  Intake            59.17 ml  Output              401 ml  Net          -341.83 ml   Filed Weights   03/09/16 2032 03/10/16 0000  03/10/16 0506  Weight: 68 kg (150 lb) 79.4 kg (175 lb 1.6 oz) 79.2 kg (174 lb 9.6 oz)    Examination:  General exam: Appears calm  Respiratory system: Clear to auscultation. Respiratory effort normal. Cardiovascular system: S1 & S2 heard, RRR. No JVD, murmurs, rubs, gallops or clicks. No pedal edema. Gastrointestinal system: Abdomen is nondistended, soft and nontender; colostomy present in right lower quadrant with significant liquid stool and gas noted in bag. Large midline scar noted Central nervous system: oriented to person. Unable to perform thorough neurologic exam- patient PERRL, able to puff cheeks out bilaterally and tongue protrudes midline, can shrug shoulders bilaterally, hand grip intact and equal bilaterally, able to move upper and lower extremities bilaterally Extremities: Symmetric 5 x 5 power- able to move arms and legs bilaterally without focal deficits Skin: No rashes, lesions or ulcers;  large midline scar noted on abdomen Psychiatry: unable to assess as patient is oriented to self     Data Reviewed: I have personally reviewed following labs and imaging studies  CBC:  Recent Labs Lab 03/07/16 1415 03/09/16 1836 03/10/16 0636  WBC 5.3 6.3 6.2  NEUTROABS  --  3.5 3.2  HGB 12.2* 12.1* 11.3*  HCT 37.6* 37.2* 34.8*  MCV 103.0* 103.0* 103.9*  PLT 91* 50* 43*   Basic Metabolic Panel:  Recent Labs Lab 03/07/16 1415 03/09/16 1836 03/10/16 0636 03/10/16 1703 03/10/16 2149  NA 145 147* 148* 151* 150*  K 4.4 4.5 4.2 4.4 4.0  CL 110 112* 121* 119* 119*  CO2 28 27 25 25 24   GLUCOSE 82 89 75 82 81  BUN 26* 33* 30* 28* 28*  CREATININE 1.64* 1.82* 1.49* 1.52* 1.51*  CALCIUM 10.1 10.4* 9.4 10.0 9.8   GFR: Estimated Creatinine Clearance: 63 mL/min (by C-G formula based on SCr of 1.51 mg/dL). Liver Function Tests:  Recent Labs Lab 03/07/16 1415 03/09/16 1836  AST 27 32  ALT 18 23  ALKPHOS 71 68  BILITOT 0.4 0.6  PROT 6.4* 6.2*  ALBUMIN 3.3* 3.2*     Recent Labs Lab 03/07/16 1415 03/09/16 1836  LIPASE 51 41    Recent Labs Lab 03/10/16 1703  AMMONIA 13   Coagulation Profile: No results for input(s): INR, PROTIME in the last 168 hours. Cardiac Enzymes: No results for input(s): CKTOTAL, CKMB, CKMBINDEX, TROPONINI in the last 168 hours. BNP (last 3 results) No results for input(s): PROBNP in the last 8760 hours. HbA1C: No results for input(s): HGBA1C in the last 72 hours. CBG:  Recent Labs Lab 03/07/16 1414 03/10/16 0823  GLUCAP 77 76   Lipid Profile: No results for input(s): CHOL, HDL, LDLCALC, TRIG, CHOLHDL, LDLDIRECT in the last 72 hours. Thyroid Function Tests:  Recent Labs  03/09/16 1836  TSH 2.945   Anemia Panel:  Recent Labs  03/10/16 0636  VITAMINB12 1,533*  FERRITIN 594*  TIBC 239*  IRON 80   Sepsis Labs: No results for input(s): PROCALCITON, LATICACIDVEN in the last 168 hours.  Recent Results (from the past 240 hour(s))  MRSA PCR Screening     Status: Abnormal   Collection Time: 03/10/16  1:15 AM  Result Value Ref Range Status   MRSA by PCR POSITIVE (A) NEGATIVE Final    Comment:        The GeneXpert MRSA Assay (FDA approved for NASAL specimens only), is one component of a comprehensive MRSA colonization surveillance program. It is not intended to diagnose MRSA infection nor to guide or monitor treatment for MRSA infections. RESULT CALLED TO, READ BACK BY AND VERIFIED WITH: ROSE K AT 0333 ON [id number] BY FORSYTH K   Culture, blood (routine x 2)     Status: None (Preliminary result)   Collection Time: 03/10/16  6:36 PM  Result Value Ref Range Status   Specimen Description BLOOD LEFT ARM  Final   Special Requests BOTTLES DRAWN AEROBIC AND ANAEROBIC 6CC EACH  Final   Culture NO GROWTH < 12 HOURS  Final   Report Status PENDING  Incomplete  Culture, blood (routine x 2)     Status: None (Preliminary result)   Collection Time: 03/10/16  6:38 PM  Result Value Ref Range Status    Specimen Description BLOOD LEFT HAND  Final   Special Requests BOTTLES DRAWN AEROBIC AND ANAEROBIC 6CC EACH  Final   Culture NO GROWTH < 12 HOURS  Final  Report Status PENDING  Incomplete         Radiology Studies: Ct Head Wo Contrast  Result Date: 03/09/2016 CLINICAL DATA:  Altered mental status. Decreased p.o. intake. Low platelets. EXAM: CT HEAD WITHOUT CONTRAST TECHNIQUE: Contiguous axial images were obtained from the base of the skull through the vertex without intravenous contrast. COMPARISON:  None. FINDINGS: Brain: There is mild cerebral atrophy which is advanced for age. There is no evidence of acute cortical infarct, intracranial hemorrhage, mass, midline shift, or extra-axial fluid collection. Vascular: No hyperdense vessel or unexpected calcification. Skull: Normal. Negative for fracture or focal lesion. Sinuses/Orbits: Right maxillary sinus mucous retention cysts. Minimal left maxillary sinus mucosal thickening. Clear mastoid air cells. Unremarkable orbits. Other: None. IMPRESSION: 1. No evidence of acute intracranial abnormality. 2. Premature cerebral atrophy. Electronically Signed   By: Logan Bores M.D.   On: 03/09/2016 21:41   Ct Abdomen Pelvis W Contrast  Result Date: 03/09/2016 CLINICAL DATA:  Acute onset of loss of appetite. Decreased hippocampal. Initial encounter. EXAM: CT ABDOMEN AND PELVIS WITH CONTRAST TECHNIQUE: Multidetector CT imaging of the abdomen and pelvis was performed using the standard protocol following bolus administration of intravenous contrast. CONTRAST:  175mL ISOVUE-300 IOPAMIDOL (ISOVUE-300) INJECTION 61% COMPARISON:  CT of the abdomen and pelvis from 04/07/2015 FINDINGS: Lower chest: Minimal bibasilar atelectasis is noted. The visualized portions of the mediastinum are unremarkable. Hepatobiliary: The liver is grossly unremarkable in appearance. Stones are noted dependently within the gallbladder. The common bile duct remains normal in caliber. Pancreas:  The pancreas is unremarkable in appearance. Spleen: The spleen is within normal limits. Adrenals/Urinary Tract: The adrenal glands are unremarkable in appearance. Small nonobstructing left renal stones are seen, measuring up to 2 mm in size. The kidneys are otherwise unremarkable. There is no evidence of hydronephrosis. No obstructing ureteral stones are identified. No perinephric stranding is seen. Stomach/Bowel: The stomach is unremarkable in appearance. The small bowel is grossly unremarkable. A right lower quadrant colostomy is unremarkable in appearance. The patient's Hartmann's pouch is within normal limits. Vascular/Lymphatic: Minimal calcification is seen along the distal abdominal aorta and its branches. No acute vascular abnormalities are seen. There is partial decompression of the IVC, likely reflecting volume depletion. No retroperitoneal lymphadenopathy is seen. No pelvic sidewall lymphadenopathy is identified. Reproductive: The bladder is significantly distended and grossly unremarkable in appearance. The prostate remains normal in size. Musculoskeletal: No acute osseous abnormalities are identified. The visualized musculature is within normal limits. Vacuum phenomenon is noted at L5-S1. Other: Mild focal soft tissue inflammation is noted at the left lower back, overlying the left side of the sacrum, raising concern for a mild sacral decubitus ulceration. IMPRESSION: 1. Mild focal soft tissue inflammation at the left lower back, overlying the left side of the sacrum, concerning for a mild sacral decubitus ulceration. Would correlate clinically. 2. Partial decompression of the IVC likely reflects volume depletion (dehydration). 3. Small nonobstructing left renal stones measure up to 2 mm in size. 4. Right lower quadrant colostomy is unremarkable. Hartmann's pouch is within normal limits. 5. Cholelithiasis.  Gallbladder otherwise grossly unremarkable. Electronically Signed   By: Garald Balding M.D.   On:  03/09/2016 21:45        Scheduled Meds: . amLODipine  10 mg Oral QHS  . benztropine  1 mg Oral BID  . carvedilol  25 mg Oral BID WC  . Chlorhexidine Gluconate Cloth  6 each Topical Q0600  . cholecalciferol  2,000 Units Oral Daily  . cloZAPine  150  mg Oral Daily  . clozapine  500 mg Oral QHS  . desmopressin  0.4 mg Oral QHS  . folic acid  2 mg Oral Daily  . glycopyrrolate  1 mg Oral TID  . haloperidol  10 mg Oral Daily  . haloperidol  10 mg Oral QHS  . haloperidol  5 mg Oral Q breakfast  . ipratropium  2 spray Each Nare Q12H  . multivitamin with minerals   Oral Daily  . mupirocin ointment  1 application Nasal BID  . sertraline  100 mg Oral QHS   Continuous Infusions: . sodium chloride 50 mL/hr at 03/10/16 1649     LOS: 1 day    Time spent: 35 minutes    Newman Pies, MD [facility] Pager [phone]  If 7PM-7AM, please contact night-coverage [email or web address] Password [email or web address] 03/11/2016, 7:42 AM

## 2016-03-11 NOTE — Progress Notes (Signed)
RN contacted [facility] to inquire about patient having a POA or Legal Guardian.  Eather Colas, supervisor from the facility called back and stated that she "did not think patient had a guardian."  However, she is going to check with her supervisor and call RN back.

## 2016-03-12 DIAGNOSIS — R1031 Right lower quadrant pain: Secondary | ICD-10-CM

## 2016-03-12 DIAGNOSIS — R1010 Upper abdominal pain, unspecified: Secondary | ICD-10-CM | POA: Insufficient documentation

## 2016-03-12 DIAGNOSIS — K219 Gastro-esophageal reflux disease without esophagitis: Secondary | ICD-10-CM

## 2016-03-12 DIAGNOSIS — R410 Disorientation, unspecified: Secondary | ICD-10-CM

## 2016-03-12 LAB — CBC WITH DIFFERENTIAL/PLATELET
Basophils Absolute: 0 10*3/uL (ref 0.0–0.1)
Basophils Relative: 0 %
Eosinophils Absolute: 0.1 10*3/uL (ref 0.0–0.7)
Eosinophils Relative: 2 %
HEMATOCRIT: 32.3 % — AB (ref 39.0–52.0)
HEMOGLOBIN: 10.5 g/dL — AB (ref 13.0–17.0)
LYMPHS ABS: 1.1 10*3/uL (ref 0.7–4.0)
LYMPHS PCT: 21 %
MCH: 33.4 pg (ref 26.0–34.0)
MCHC: 32.5 g/dL (ref 30.0–36.0)
MCV: 102.9 fL — ABNORMAL HIGH (ref 78.0–100.0)
MONOS PCT: 24 %
Monocytes Absolute: 1.3 10*3/uL — ABNORMAL HIGH (ref 0.1–1.0)
NEUTROS ABS: 2.7 10*3/uL (ref 1.7–7.7)
NEUTROS PCT: 53 %
Platelets: 43 10*3/uL — ABNORMAL LOW (ref 150–400)
RBC: 3.14 MIL/uL — AB (ref 4.22–5.81)
RDW: 15.9 % — ABNORMAL HIGH (ref 11.5–15.5)
WBC: 5.2 10*3/uL (ref 4.0–10.5)

## 2016-03-12 LAB — BASIC METABOLIC PANEL
ANION GAP: 4 — AB (ref 5–15)
BUN: 20 mg/dL (ref 6–20)
BUN: 19 mg/dL (ref 6–20)
CALCIUM: 9 mg/dL (ref 8.9–10.3)
CHLORIDE: 119 mmol/L — AB (ref 101–111)
CO2: 27 mmol/L (ref 22–32)
CO2: 26 mmol/L (ref 22–32)
CREATININE: 1.42 mg/dL — AB (ref 0.61–1.24)
CREATININE: 1.54 mg/dL — AB (ref 0.61–1.24)
Calcium: 8.9 mg/dL (ref 8.9–10.3)
Chloride: 115 mmol/L — ABNORMAL HIGH (ref 101–111)
GFR calc Af Amer: >60 mL/min (ref >60)
GFR calc non Af Amer: 57 mL/min — ABNORMAL LOW (ref >60)
GFR calc non Af Amer: 51 mL/min — ABNORMAL LOW (ref >60)
GFR, EST AFRICAN AMERICAN: 60 mL/min — AB (ref >60)
Glucose, Bld: 83 mg/dL (ref 65–99)
Glucose, Bld: 138 mg/dL — ABNORMAL HIGH (ref 65–99)
POTASSIUM: 3.7 mmol/L (ref 3.5–5.1)
Potassium: 3.5 mmol/L (ref 3.5–5.1)
SODIUM: 145 mmol/L (ref 135–145)
Sodium: 146 mmol/L — ABNORMAL HIGH (ref 135–145)

## 2016-03-12 LAB — GLUCOSE, CAPILLARY: Glucose-Capillary: 84 mg/dL (ref 65–99)

## 2016-03-12 MED ORDER — IMMUNE GLOBULIN (HUMAN) 20 GM/200ML IV SOLN
1.0000 g/kg | Freq: Once | INTRAVENOUS | Status: AC
  Administered 2016-03-12: 80 g via INTRAVENOUS
  Filled 2016-03-12: qty 800

## 2016-03-12 NOTE — Progress Notes (Signed)
Restraint order expired, soft belt restraint removed.  Notified MD that order had expired.  Patient more alert and appropriate this morning.  Will not continue restraint at this time.  Will continue to assess and monitor.  Patient resting quietly in bed.  Responds when spoken to.  Took all PO meds this AM without difficulty.

## 2016-03-12 NOTE — Progress Notes (Signed)
PROGRESS NOTE    Mario Ortega  [id number] DOB: 05-13-1966 DOA: 03/09/2016 PCP: Christie Nottingham., PA    Brief Narrative:  Mario Ortega is a 50 y.o. male with medical history significant for schizophrenia, chronic kidney disease stage III, hypertension, and macrocytic anemia who presents to the emergency department from his nursing facility for evaluation of low platelets and lethargy. Patient had reportedly been in his usual state until approximately 3 days ago when he was noted to be more lethargic and not eating. He was evaluated in the emergency department 2 days ago for this, noted to have thrombocytopenia, but was otherwise stable, had no complaints himself, and was returned to his facility. Since his return, his lethargy is increased and he continues to refuse food. He has denied recent fevers, chills, chest pain, palpitations, dyspnea, or cough. He is also denied abdominal pain. Upon arrival to the ED, patient is found to be afebrile, saturating well on room air, hypertensive in the 140/100 range, and vitals otherwise stable. EKG demonstrates a sinus rhythm with incomplete right bundle branch block and noncontrast head CT is negative for acute intracranial abnormality. Chemistry panels notable for a hypernatremia, hyperchloremia, and serum creatinine 1.82, up from 1.48 two weeks prior. Valproic acid level is elevated to 112. Patient became agitated in the emergency department and was treated with a dose of IV Ativan. CT of the abdomen and pelvis was obtained and there is a decompressed IVC consistent with dehydration and no emergent findings. Patient was given a 1 L normal saline bolus and continued on an aggressive IV hydration with 250 cc/hr. he remained hemodynamically stable in the ED and in no respiratory distress.  Platelets continued to decline and patient was started on IVIG.  He did slowly become more awake and alert.  Assessment & Plan:   Principal Problem:   Altered mental  status Active Problems:   HTN (hypertension)   GERD (gastroesophageal reflux disease)   Schizophrenia (HCC)   Thrombocytopenia (HCC)   AKI (acute kidney injury) ([place])   CKD (chronic kidney disease), stage III   Lethargy   Dehydration   Lethargy, loss of appetite  - Uncertain etiology, mild Depakote toxicity considered given elevated drug level  - Head CT is negative for acute intracranial abnormality  - Plan to hold Depakote -TSH, B12, both normal - provide IVF hydration - Blood cultures x 2- NGTD - ABG was mostly within normal limits (slightly low O2) - ammonia level WNL - UA WNL - patient more awake today but still fluctuates between sleeping and being awake and alert   Hypernatremia - Na at 146 this am - continue IVF to 29ml/hr of 0.5NS - redraw BMP in afternoon - encourage PO intake  Thrombocytopenia  - Platelet count 32,000 yesterday (improved to 43 today)  - IVIG administered at 1g/kg yesterday and will be repeated today - Clozapine to be slowly tapered and then discontinued - No signs of bleeding - Had been 53k last month, but previously wnl  - Other cell lines remain stable; no evidence for infectious process  - Could be secondary to Depakote or Clozapine - Will draw serial CBCDs to monitor platelet count - Discussed with Dr. Whitney Muse- question of ITP - if platelet count goes up after treatment with IVIG then ITP (or immune mediated thrombocytopenia) likely  AKI superimposed on CKD stage III  - SCr 1.42 today - continues to improve - Repeat chem panel qAM     Macrocytic anemia  - Hgb 10.5 with  MCV 103.0 on admission; stable relative to prior CBC's - Continue folate supplementation   Schizophrenia  - Difficult to assess on admission d/t his lethargy  - Has required inpatient treatment on multiple occasions and was incarcerated for murdering his grandfather  - Continue Haldol, Cogentin, Zoloft as patient tolerates - slowly taper Clozaril to  discontinue - Depakote held as above - TTS consult placed- medication management guidance appreciated - Recommendations from psych are Haldol 10mg  BID and Ativan 2mg  BID PRN agitation  Hypertension - Elevated beyond goal at time of admission  - Managed with Norvasc and Coreg at home, will continue  - Labetalol IVP's available prn    DVT prophylaxis: SCD's Code Status: Full Family Communication: Called patient's mother Mario Ortega  Disposition Plan: will discharge back to [facility] when medically stable and improved, at this time it is unclear when patient will be ready for discharge- anticipate at least another 48- 72 hours   Consultants:   None  Procedures:   None  Antimicrobials:   None    Subjective: Patient slightly more awake today.  States he is hungry and wants scrambled eggs and bacon for breakfast.  He knows he is in [facility].  He also asked for coffee.  He does endorse some abdominal pain.  Denies chest pain, shortness of breath.  Took all medications this morning.  Ate breakfast.  Colostomy with more fluid stool in it.   Objective: Vitals:   03/11/16 2158 03/11/16 2251 03/12/16 0000 03/12/16 0651  BP: (!) 131/96  126/90 132/86  Pulse: 77  84 82  Resp: 20     Temp: (!) 100.9 F (38.3 C)  (!) 100.8 F (38.2 C) 97.7 F (36.5 C)  TempSrc: Oral  Oral Axillary  SpO2: 100% 97% 100% 98%  Weight:      Height:        Intake/Output Summary (Last 24 hours) at 03/12/16 0814 Last data filed at 03/12/16 [id number]  Gross per 24 hour  Intake              240 ml  Output              600 ml  Net             -360 ml   Filed Weights   03/09/16 2032 03/10/16 0000 03/10/16 0506  Weight: 68 kg (150 lb) 79.4 kg (175 lb 1.6 oz) 79.2 kg (174 lb 9.6 oz)    Examination:  General exam: Appears calm  Respiratory system: Clear to auscultation. Respiratory effort normal. Cardiovascular system: S1 & S2 heard, RRR. No JVD, murmurs, rubs, gallops or clicks. No pedal  edema. Gastrointestinal system: Abdomen is nondistended, soft and nontender; colostomy present in right lower quadrant with significant liquid stool and gas noted in bag. Large midline scar noted Central nervous system: oriented to person. Unable to perform thorough neurologic exam- patient PERRL, able to puff cheeks out bilaterally and tongue protrudes midline, can shrug shoulders bilaterally, hand grip intact and equal bilaterally, able to move upper and lower extremities bilaterally Extremities: Symmetric 5 x 5 power- able to move arms and legs bilaterally without focal deficits Skin: No rashes, lesions or ulcers; large midline scar noted on abdomen Psychiatry: unable to assess as patient is oriented to self     Data Reviewed: I have personally reviewed following labs and imaging studies  CBC:  Recent Labs Lab 03/07/16 1415 03/09/16 1836 03/10/16 0636 03/11/16 0627 03/12/16 0635  WBC 5.3 6.3 6.2  6.9 5.2  NEUTROABS  --  3.5 3.2 3.2 2.7  HGB 12.2* 12.1* 11.3* 11.4* 10.5*  HCT 37.6* 37.2* 34.8* 35.5* 32.3*  MCV 103.0* 103.0* 103.9* 103.8* 102.9*  PLT 91* 50* 43* 32* PENDING   Basic Metabolic Panel:  Recent Labs Lab 03/10/16 1703 03/10/16 2149 03/11/16 0627 03/11/16 1240 03/12/16 0635  NA 151* 150* 150* 149* 146*  K 4.4 4.0 3.9 3.6 3.7  CL 119* 119* 119* 120* 119*  CO2 25 24 26 27 27   GLUCOSE 82 81 78 133* 83  BUN 28* 28* 26* 24* 20  CREATININE 1.52* 1.51* 1.46* 1.41* 1.42*  CALCIUM 10.0 9.8 10.0 9.6 8.9   GFR: Estimated Creatinine Clearance: 67 mL/min (by C-G formula based on SCr of 1.42 mg/dL). Liver Function Tests:  Recent Labs Lab 03/07/16 1415 03/09/16 1836  AST 27 32  ALT 18 23  ALKPHOS 71 68  BILITOT 0.4 0.6  PROT 6.4* 6.2*  ALBUMIN 3.3* 3.2*    Recent Labs Lab 03/07/16 1415 03/09/16 1836  LIPASE 51 41    Recent Labs Lab 03/10/16 1703  AMMONIA 13   Coagulation Profile: No results for input(s): INR, PROTIME in the last 168 hours. Cardiac  Enzymes: No results for input(s): CKTOTAL, CKMB, CKMBINDEX, TROPONINI in the last 168 hours. BNP (last 3 results) No results for input(s): PROBNP in the last 8760 hours. HbA1C: No results for input(s): HGBA1C in the last 72 hours. CBG:  Recent Labs Lab 03/07/16 1414 03/10/16 0823 03/11/16 0748 03/11/16 2155 03/12/16 0802  GLUCAP 77 76 70 87 84   Lipid Profile: No results for input(s): CHOL, HDL, LDLCALC, TRIG, CHOLHDL, LDLDIRECT in the last 72 hours. Thyroid Function Tests:  Recent Labs  03/09/16 1836  TSH 2.945   Anemia Panel:  Recent Labs  03/10/16 0636  VITAMINB12 1,533*  FERRITIN 594*  TIBC 239*  IRON 80   Sepsis Labs: No results for input(s): PROCALCITON, LATICACIDVEN in the last 168 hours.  Recent Results (from the past 240 hour(s))  MRSA PCR Screening     Status: Abnormal   Collection Time: 03/10/16  1:15 AM  Result Value Ref Range Status   MRSA by PCR POSITIVE (A) NEGATIVE Final    Comment:        The GeneXpert MRSA Assay (FDA approved for NASAL specimens only), is one component of a comprehensive MRSA colonization surveillance program. It is not intended to diagnose MRSA infection nor to guide or monitor treatment for MRSA infections. RESULT CALLED TO, READ BACK BY AND VERIFIED WITH: ROSE K AT 0333 ON [id number] BY FORSYTH K   Culture, blood (routine x 2)     Status: None (Preliminary result)   Collection Time: 03/10/16  6:36 PM  Result Value Ref Range Status   Specimen Description BLOOD LEFT ARM  Final   Special Requests BOTTLES DRAWN AEROBIC AND ANAEROBIC 6CC EACH  Final   Culture NO GROWTH < 12 HOURS  Final   Report Status PENDING  Incomplete  Culture, blood (routine x 2)     Status: None (Preliminary result)   Collection Time: 03/10/16  6:38 PM  Result Value Ref Range Status   Specimen Description BLOOD LEFT HAND  Final   Special Requests BOTTLES DRAWN AEROBIC AND ANAEROBIC 6CC EACH  Final   Culture NO GROWTH < 12 HOURS  Final   Report  Status PENDING  Incomplete         Radiology Studies: No results found.      Scheduled Meds: .  amLODipine  10 mg Oral QHS  . benztropine  1 mg Oral BID  . carvedilol  25 mg Oral BID WC  . Chlorhexidine Gluconate Cloth  6 each Topical Q0600  . cholecalciferol  2,000 Units Oral Daily  . clozapine  250 mg Oral QHS  . cloZAPine  75 mg Oral Daily  . desmopressin  0.4 mg Oral QHS  . folic acid  2 mg Oral Daily  . glycopyrrolate  1 mg Oral TID  . haloperidol  10 mg Oral Daily  . haloperidol  10 mg Oral QHS  . haloperidol  5 mg Oral Q breakfast  . ipratropium  2 spray Each Nare Q12H  . multivitamin with minerals   Oral Daily  . mupirocin ointment  1 application Nasal BID  . sertraline  100 mg Oral QHS   Continuous Infusions: . sodium chloride 50 mL/hr at 03/10/16 1649     LOS: 2 days    Time spent: 35 minutes    Newman Pies, MD [facility] Pager [phone]  If 7PM-7AM, please contact night-coverage [email or web address] Password [email or web address] 03/12/2016, 8:14 AM

## 2016-03-13 LAB — CBC WITH DIFFERENTIAL/PLATELET
BASOS ABS: 0 10*3/uL (ref 0.0–0.1)
Basophils Relative: 0 %
Eosinophils Absolute: 0.2 10*3/uL (ref 0.0–0.7)
Eosinophils Relative: 3 %
HEMATOCRIT: 33.5 % — AB (ref 39.0–52.0)
HEMOGLOBIN: 10.7 g/dL — AB (ref 13.0–17.0)
LYMPHS PCT: 39 %
Lymphs Abs: 2 10*3/uL (ref 0.7–4.0)
MCH: 33 pg (ref 26.0–34.0)
MCHC: 31.9 g/dL (ref 30.0–36.0)
MCV: 103.4 fL — AB (ref 78.0–100.0)
Monocytes Absolute: 1.1 10*3/uL — ABNORMAL HIGH (ref 0.1–1.0)
Monocytes Relative: 22 %
NEUTROS ABS: 1.8 10*3/uL (ref 1.7–7.7)
Neutrophils Relative %: 35 %
Platelets: 50 10*3/uL — ABNORMAL LOW (ref 150–400)
RBC: 3.24 MIL/uL — AB (ref 4.22–5.81)
RDW: 16.1 % — ABNORMAL HIGH (ref 11.5–15.5)
WBC: 5.1 10*3/uL (ref 4.0–10.5)

## 2016-03-13 LAB — BASIC METABOLIC PANEL
BUN: 18 mg/dL (ref 6–20)
CHLORIDE: 114 mmol/L — AB (ref 101–111)
CO2: 26 mmol/L (ref 22–32)
Calcium: 8.9 mg/dL (ref 8.9–10.3)
Creatinine, Ser: 1.4 mg/dL — ABNORMAL HIGH (ref 0.61–1.24)
GFR calc Af Amer: >60 mL/min (ref >60)
GFR calc non Af Amer: 58 mL/min — ABNORMAL LOW (ref >60)
Glucose, Bld: 93 mg/dL (ref 65–99)
POTASSIUM: 3.4 mmol/L — AB (ref 3.5–5.1)
SODIUM: 142 mmol/L (ref 135–145)

## 2016-03-13 LAB — HIV ANTIBODY (ROUTINE TESTING W REFLEX): HIV Screen 4th Generation wRfx: NONREACTIVE

## 2016-03-13 LAB — FOLATE RBC
FOLATE, RBC: 1592 ng/mL (ref >498)
Folate, Hemolysate: 536.6 ng/mL
HEMATOCRIT: 33.7 % — AB (ref 37.5–51.0)

## 2016-03-13 LAB — CLOZAPINE (CLOZARIL)
Clozapine Lvl: 473 ng/mL (ref 350–650)
NorClozapine: 208 ng/mL
TOTAL(CLOZ+ NORCLOZ): 681 ng/mL

## 2016-03-13 LAB — GLUCOSE, CAPILLARY: GLUCOSE-CAPILLARY: 127 mg/dL — AB (ref 65–99)

## 2016-03-13 LAB — HEPATITIS C ANTIBODY

## 2016-03-13 NOTE — Care Management Important Message (Signed)
Important Message  Patient Details  Name: Mario Ortega MRN: [id number] Date of Birth: 06-28-1966   Medicare Important Message Given:  Yes    [name], [name], RN 03/13/2016, 9:43 AM

## 2016-03-13 NOTE — Progress Notes (Signed)
PROGRESS NOTE    Mario Ortega  [id number] DOB: 07-26-1965 DOA: 03/09/2016 PCP: Christie Nottingham., PA    Brief Narrative:  Mario Ortega is a 50 y.o. male with medical history significant for schizophrenia, chronic kidney disease stage III, hypertension, and macrocytic anemia who presents to the emergency department from his nursing facility for evaluation of low platelets and lethargy. Patient had reportedly been in his usual state until approximately 3 days ago when he was noted to be more lethargic and not eating. He was evaluated in the emergency department 2 days ago for this, noted to have thrombocytopenia, but was otherwise stable, had no complaints himself, and was returned to his facility. Since his return, his lethargy is increased and he continues to refuse food. He has denied recent fevers, chills, chest pain, palpitations, dyspnea, or cough. He is also denied abdominal pain. Upon arrival to the ED, patient is found to be afebrile, saturating well on room air, hypertensive in the 140/100 range, and vitals otherwise stable. EKG demonstrates a sinus rhythm with incomplete right bundle branch block and noncontrast head CT is negative for acute intracranial abnormality. Chemistry panels notable for a hypernatremia, hyperchloremia, and serum creatinine 1.82, up from 1.48 two weeks prior. Valproic acid level is elevated to 112. Patient became agitated in the emergency department and was treated with a dose of IV Ativan. CT of the abdomen and pelvis was obtained and there is a decompressed IVC consistent with dehydration and no emergent findings. Patient was given a 1 L normal saline bolus and continued on an aggressive IV hydration with 250 cc/hr. he remained hemodynamically stable in the ED and in no respiratory distress.  Platelets continued to decline and patient was started on IVIG.  He did slowly become more awake and alert with the taper of Clozapine and the discontinuation of Valproic  Acid.  Assessment & Plan:   Principal Problem:   Altered mental status Active Problems:   HTN (hypertension)   GERD (gastroesophageal reflux disease)   Schizophrenia (HCC)   Thrombocytopenia (HCC)   AKI (acute kidney injury) ([place])   CKD (chronic kidney disease), stage III   Lethargy   Dehydration   Upper abdominal pain   Lethargy, loss of appetite  - Uncertain etiology, mild Depakote toxicity considered given elevated drug level  - Head CT is negative for acute intracranial abnormality  - Plan to hold Depakote -TSH, B12, both normal - provide IVF hydration - Blood cultures x 2- NGTD - ABG was mostly within normal limits (slightly low O2) - ammonia level WNL - UA WNL - patient more awake and alert today- able to participate in full conversation  - will order PT eval  Hypernatremia - Na at 142 this am - d/c IVF today - encourage PO intake- patient having good PO intake  Thrombocytopenia  - Platelet count 32,000 yesterday (improved to 50 today)  - IVIG administered at 1g/kg for the past two days - Clozapine to be slowly tapered and then discontinued - No signs of bleeding - Had been 53k last month, but previously wnl  - Other cell lines remain stable; no evidence for infectious process  - Could be secondary to Depakote or Clozapine - Will draw serial CBCDs to monitor platelet count - Discussed with Dr. Whitney Muse- question of ITP - if platelet count goes up after treatment with IVIG then ITP (or immune mediated thrombocytopenia) likely  AKI superimposed on CKD stage III  - SCr 1.40 today - continues to improve -  Repeat chem panel qAM     Macrocytic anemia  - Hgb 10.7 with MCV 103.4 on admission; stable relative to prior CBC's - Continue folate supplementation   Schizophrenia  - Difficult to assess on admission d/t his lethargy  - Has required inpatient treatment on multiple occasions and was incarcerated for murdering his grandfather  - Continue Haldol,  Cogentin, Zoloft as patient tolerates - slowly taper Clozaril to discontinue - Depakote held as above - TTS consult placed- medication management guidance appreciated - Recommendations from psych are Haldol 10mg  BID and Ativan 2mg  BID PRN agitation - will continue to monitor- patient is more awake today and able to participate in conversation today  Hypertension - Elevated beyond goal at time of admission  - Managed with Norvasc and Coreg at home, will continue  - Labetalol IVP's available prn    DVT prophylaxis: SCD's Code Status: Full Family Communication: Called patient's mother Mario Ortega twice without answer.  Patient's previous facility to come to assess him today Disposition Plan: will discharge back to [facility] when medically stable and improved, at this time it is unclear when patient will be ready for discharge- anticipate at least another 24- 48 hours   Consultants:   None  Procedures:   None  Antimicrobials:   None    Subjective: Patient asleep at initiation of exam.  He awoke to name and was alert.  Was awake for the entirety of assessment.  Patient reports that he does not want to go back to the house he is living at currently. He mentions the food is not good.  When discussing his recent decline in function and social life he reports that he has felt extremely dizzy and weak and that is why he has not gotten up and that is also a contribution to his poor PO intake is the dizziness and feeling unstable.  He voices he has been moved to many different facilities in the past.  He denies any abdominal pain currently.  He reports that he is willing to work with PT to walk around and be assessed for stability with ambulation.   Objective: Vitals:   03/12/16 1930 03/12/16 2012 03/12/16 2300 03/13/16 0630  BP: 115/77 (!) 122/92 (!) 139/92 (!) 142/85  Pulse: 61 (!) 58 65 72  Resp: 16 16 16 16   Temp: 98.2 F (36.8 C) 97.3 F (36.3 C) 98.7 F (37.1 C) 98.5 F (36.9  C)  TempSrc: Oral Oral Oral Oral  SpO2: 100% 100% 100% 99%  Weight:      Height:        Intake/Output Summary (Last 24 hours) at 03/13/16 0943 Last data filed at 03/12/16 1823  Gross per 24 hour  Intake              220 ml  Output             1450 ml  Net            -1230 ml   Filed Weights   03/09/16 2032 03/10/16 0000 03/10/16 0506  Weight: 68 kg (150 lb) 79.4 kg (175 lb 1.6 oz) 79.2 kg (174 lb 9.6 oz)    Examination:  General exam: Appears calm  Respiratory system: Clear to auscultation. Respiratory effort normal. Cardiovascular system: S1 & S2 heard, RRR. No JVD, murmurs, rubs, gallops or clicks. No pedal edema. Gastrointestinal system: Abdomen is nondistended, soft and nontender; colostomy present in right lower quadrant with significant greenish brown liquid stool and gas noted in  bag. Large midline scar noted Central nervous system: oriented to person, year (states its August but patient has been asleep/ disoriented for significant portion of September). Patient PERRL, able to puff cheeks out bilaterally and tongue protrudes midline, can shrug shoulders bilaterally, hand grip intact and equal bilaterally, able to move upper and lower extremities bilaterally. CN II-XII GI. Patient does have strabismus noted  Extremities: Symmetric 5 x 5 power- able to move arms and legs bilaterally without focal deficits Skin: No rashes, lesions or ulcers; large midline scar noted on abdomen Psychiatry: unable to assess as patient is oriented to self     Data Reviewed: I have personally reviewed following labs and imaging studies  CBC:  Recent Labs Lab 03/09/16 1836 03/10/16 0636 03/11/16 0627 03/12/16 0635 03/13/16 0703  WBC 6.3 6.2 6.9 5.2 5.1  NEUTROABS 3.5 3.2 3.2 2.7 1.8  HGB 12.1* 11.3* 11.4* 10.5* 10.7*  HCT 37.2* 34.8* 35.5* 32.3* 33.5*  MCV 103.0* 103.9* 103.8* 102.9* 103.4*  PLT 50* 43* 32* 43* 50*   Basic Metabolic Panel:  Recent Labs Lab 03/11/16 0627  03/11/16 1240 03/12/16 0635 03/12/16 1825 03/13/16 0643  NA 150* 149* 146* 145 142  K 3.9 3.6 3.7 3.5 3.4*  CL 119* 120* 119* 115* 114*  CO2 26 27 27 26 26   GLUCOSE 78 133* 83 138* 93  BUN 26* 24* 20 19 18   CREATININE 1.46* 1.41* 1.42* 1.54* 1.40*  CALCIUM 10.0 9.6 8.9 9.0 8.9   GFR: Estimated Creatinine Clearance: 68 mL/min (by C-G formula based on SCr of 1.4 mg/dL). Liver Function Tests:  Recent Labs Lab 03/07/16 1415 03/09/16 1836  AST 27 32  ALT 18 23  ALKPHOS 71 68  BILITOT 0.4 0.6  PROT 6.4* 6.2*  ALBUMIN 3.3* 3.2*    Recent Labs Lab 03/07/16 1415 03/09/16 1836  LIPASE 51 41    Recent Labs Lab 03/10/16 1703  AMMONIA 13   Coagulation Profile: No results for input(s): INR, PROTIME in the last 168 hours. Cardiac Enzymes: No results for input(s): CKTOTAL, CKMB, CKMBINDEX, TROPONINI in the last 168 hours. BNP (last 3 results) No results for input(s): PROBNP in the last 8760 hours. HbA1C: No results for input(s): HGBA1C in the last 72 hours. CBG:  Recent Labs Lab 03/10/16 0823 03/11/16 0748 03/11/16 2155 03/12/16 0802 03/13/16 0734  GLUCAP 76 70 87 84 127*   Lipid Profile: No results for input(s): CHOL, HDL, LDLCALC, TRIG, CHOLHDL, LDLDIRECT in the last 72 hours. Thyroid Function Tests: No results for input(s): TSH, T4TOTAL, FREET4, T3FREE, THYROIDAB in the last 72 hours. Anemia Panel: No results for input(s): VITAMINB12, FOLATE, FERRITIN, TIBC, IRON, RETICCTPCT in the last 72 hours. Sepsis Labs: No results for input(s): PROCALCITON, LATICACIDVEN in the last 168 hours.  Recent Results (from the past 240 hour(s))  MRSA PCR Screening     Status: Abnormal   Collection Time: 03/10/16  1:15 AM  Result Value Ref Range Status   MRSA by PCR POSITIVE (A) NEGATIVE Final    Comment:        The GeneXpert MRSA Assay (FDA approved for NASAL specimens only), is one component of a comprehensive MRSA colonization surveillance program. It is not intended  to diagnose MRSA infection nor to guide or monitor treatment for MRSA infections. RESULT CALLED TO, READ BACK BY AND VERIFIED WITH: ROSE K AT 0333 ON [id number] BY FORSYTH K   Culture, blood (routine x 2)     Status: None (Preliminary result)   Collection Time: 03/10/16  6:36 PM  Result Value Ref Range Status   Specimen Description BLOOD LEFT ARM  Final   Special Requests BOTTLES DRAWN AEROBIC AND ANAEROBIC 6CC EACH  Final   Culture NO GROWTH 2 DAYS  Final   Report Status PENDING  Incomplete  Culture, blood (routine x 2)     Status: None (Preliminary result)   Collection Time: 03/10/16  6:38 PM  Result Value Ref Range Status   Specimen Description BLOOD LEFT HAND  Final   Special Requests BOTTLES DRAWN AEROBIC AND ANAEROBIC 6CC EACH  Final   Culture NO GROWTH 2 DAYS  Final   Report Status PENDING  Incomplete         Radiology Studies: No results found.      Scheduled Meds: . amLODipine  10 mg Oral QHS  . benztropine  1 mg Oral BID  . carvedilol  25 mg Oral BID WC  . Chlorhexidine Gluconate Cloth  6 each Topical Q0600  . cholecalciferol  2,000 Units Oral Daily  . clozapine  250 mg Oral QHS  . cloZAPine  75 mg Oral Daily  . desmopressin  0.4 mg Oral QHS  . folic acid  2 mg Oral Daily  . glycopyrrolate  1 mg Oral TID  . haloperidol  10 mg Oral Daily  . haloperidol  10 mg Oral QHS  . haloperidol  5 mg Oral Q breakfast  . ipratropium  2 spray Each Nare Q12H  . multivitamin with minerals   Oral Daily  . mupirocin ointment  1 application Nasal BID  . sertraline  100 mg Oral QHS   Continuous Infusions:     LOS: 3 days    Time spent: 35 minutes    Newman Pies, MD [facility] Pager [phone]  If 7PM-7AM, please contact night-coverage [email or web address] Password [facility] 03/13/2016, 9:43 AM

## 2016-03-13 NOTE — Evaluation (Signed)
Physical Therapy Evaluation Patient Details Name: Mario Ortega MRN: [id number] DOB: Apr 04, 1966 Today's Date: 03/13/2016   History of Present Illness  50 y.o. male with medical history significant for schizophrenia, chronic kidney disease stage III, hypertension, and macrocytic anemia who presents to the emergency department from his nursing facility for evaluation of low platelets and lethargy. Patient had reportedly been in his usual state until approximately 3 days ago when he was noted to be more lethargic and not eating.  EKG demonstrates a sinus rhythm with incomplete right bundle branch block and noncontrast head CT is negative for acute intracranial abnormality. Chemistry panels notable for a hypernatremia, hyperchloremia, and serum creatinine 1.82, up from 1.48 two weeks prior. Valproic acid level is elevated to 112. Patient became agitated in the emergency department and was treated with a dose of IV Ativan. CT of the abdomen and pelvis was obtained and there is a decompressed IVC consistent with dehydration and no emergent findings. Patient was given a 1 L normal saline bolus and continued on an aggressive IV hydration with 250 cc/hr. he remained hemodynamically stable in the ED and in no respiratory distress. He will be observed on the medical/surgical unit for ongoing evaluation and management of lethargy, loss of appetite, and thrombocytopenia, possibly secondary to Depakote use with mild toxicity.  Clinical Impression  Pt received in bed, and was agreeable to PT evaluation.  Pt is very tangental with conversation.  Sometimes he answers appropriately, and other times he is completely off topic with his answers to PLOF questions.  Per chart and SW, the pt is from a group home, however he is insistent that he lives at [facility]" with his brother.   Pt requires supervision for bed mobility, and Min guard for sit<>stand.  He ambulated 562ft with RW Min guard, but demonstrates moderate R  foot drag.  At this point, he would be able to return back to his ALF with HHPT, and a RW if he does not already have one.      Follow Up Recommendations Home health PT    Equipment Recommendations  Rolling walker with 5" wheels    Recommendations for Other Services       Precautions / Restrictions Precautions Precautions: Fall Precaution Comments: Pt reports multiple falls when he was trying to escape.   Restrictions Weight Bearing Restrictions: No      Mobility  Bed Mobility Overal bed mobility: Needs Assistance Bed Mobility: Supine to Sit     Supine to sit: Supervision (increased time)        Transfers Overall transfer level: Needs assistance   Transfers: Sit to/from Stand Sit to Stand: Supervision (increased time for motor planning. )            Ambulation/Gait Ambulation/Gait assistance: Min guard Ambulation Distance (Feet): 500 Feet Assistive device: Rolling walker (2 wheeled) Gait Pattern/deviations: Step-through pattern;Decreased dorsiflexion - right;Trunk flexed     General Gait Details: Pt with R foot drop, but able to mildly correct with vc's, but does not maintain for more than 5 steps.  Pt is highly distractable, wanting to look in rooms, and talk to every person in the hallway.    Stairs            Wheelchair Mobility    Modified Rankin (Stroke Patients Only)       Balance Overall balance assessment: Needs assistance         Standing balance support: Bilateral upper extremity supported Standing balance-Leahy Scale: Fair  Pertinent Vitals/Pain Pain Assessment: 0-10 Pain Location: bottom of his right foot, and ankle    Home Living   Living Arrangements: Group Home ([facility] - pt states he lives with his brother, chart states he lives at a group home. )   Type of Home: Apartment Home Access: Level entry     Home Layout: One level Home Equipment: None      Prior Function      Gait / Transfers Assistance Needed: Pt reports he was independent.    ADL's / Homemaking Assistance Needed: independent with dressing, and bathing.          Hand Dominance   Dominant Hand:  (Pt unable to answer - mumbling phrases off topic "just me and my mother" )    Extremity/Trunk Assessment   Upper Extremity Assessment: Overall WFL for tasks assessed           Lower Extremity Assessment: Overall WFL for tasks assessed         Communication      Cognition Arousal/Alertness: Awake/alert Behavior During Therapy: Restless Overall Cognitive Status: Impaired/Different from baseline Area of Impairment: Orientation Orientation Level: Disoriented to;Situation;Time             General Comments: Pt goes on tangents about his childhood, and how he was always in trouble, and he was a drug addict.      General Comments      Exercises        Assessment/Plan    PT Assessment    PT Diagnosis Difficulty walking;Abnormality of gait   PT Problem List    PT Treatment Interventions     PT Goals (Current goals can be found in the Care Plan section) Acute Rehab PT Goals PT Goal Formulation: Patient unable to participate in goal setting Time For Goal Achievement: 03/20/16 Potential to Achieve Goals: Good    Frequency     Barriers to discharge        Co-evaluation               End of Session Equipment Utilized During Treatment: Gait belt Activity Tolerance: Patient tolerated treatment well Patient left: in chair;with call bell/phone within reach;with chair alarm set Nurse Communication: Mobility status (RN notified of pt's location in the chair with chair alarm on, fall precautions pad under the recliner and pt set up to eat his lunch.  )    Functional Assessment Tool Used: [place] "6-click"  Functional Limitation: Mobility: Walking and moving around Mobility: Walking and Moving Around Current Status [phone]): At least 20 percent but  less than 40 percent impaired, limited or restricted Mobility: Walking and Moving Around Goal Status [phone]): At least 1 percent but less than 20 percent impaired, limited or restricted    Time: 1110-1146 PT Time Calculation (min) (ACUTE ONLY): 36 min   Charges:   PT Evaluation $PT Eval Moderate Complexity: 1 Procedure PT Treatments $Gait Training: 8-22 mins   PT G Codes:   PT G-Codes **NOT FOR INPATIENT CLASS** Functional Assessment Tool Used: [employer] "6-click"  Functional Limitation: Mobility: Walking and moving around Mobility: Walking and Moving Around Current Status [phone]): At least 20 percent but less than 40 percent impaired, limited or restricted Mobility: Walking and Moving Around Goal Status [phone]): At least 1 percent but less than 20 percent impaired, limited or restricted    Beth Mccade Sullenberger, PT, DPT X: [phone]

## 2016-03-13 NOTE — Clinical Social Work Note (Signed)
CSW received consult for guardianship. However, today MD reports pt is much more alert and oriented. He worked with PT and recommendation is to return to group home. CSW updated [place] Night at facility. Anticipate d/c tomorrow.   Benay Pike, [place]

## 2016-03-14 LAB — GLUCOSE, CAPILLARY: Glucose-Capillary: 85 mg/dL (ref 65–99)

## 2016-03-14 LAB — CBC WITH DIFFERENTIAL/PLATELET
BASOS ABS: 0 10*3/uL (ref 0.0–0.1)
Basophils Relative: 0 %
EOS ABS: 0.3 10*3/uL (ref 0.0–0.7)
Eosinophils Relative: 4 %
HCT: 27.7 % — ABNORMAL LOW (ref 39.0–52.0)
HEMOGLOBIN: 8.8 g/dL — AB (ref 13.0–17.0)
LYMPHS ABS: 2.6 10*3/uL (ref 0.7–4.0)
Lymphocytes Relative: 42 %
MCH: 33 pg (ref 26.0–34.0)
MCHC: 31.8 g/dL (ref 30.0–36.0)
MCV: 103.7 fL — ABNORMAL HIGH (ref 78.0–100.0)
MONO ABS: 1.4 10*3/uL — AB (ref 0.1–1.0)
Monocytes Relative: 23 %
NEUTROS PCT: 31 %
Neutro Abs: 2 10*3/uL (ref 1.7–7.7)
PLATELETS: 88 10*3/uL — AB (ref 150–400)
RBC: 2.67 MIL/uL — AB (ref 4.22–5.81)
RDW: 14.9 % (ref 11.5–15.5)
WBC: 6.3 10*3/uL (ref 4.0–10.5)

## 2016-03-14 LAB — COMPREHENSIVE METABOLIC PANEL
ALBUMIN: 2.4 g/dL — AB (ref 3.5–5.0)
ALK PHOS: 54 U/L (ref 38–126)
ALT: 26 U/L (ref 17–63)
AST: 33 U/L (ref 15–41)
BUN: 16 mg/dL (ref 6–20)
CALCIUM: 8.7 mg/dL — AB (ref 8.9–10.3)
CO2: 27 mmol/L (ref 22–32)
CREATININE: 1.37 mg/dL — AB (ref 0.61–1.24)
Chloride: 112 mmol/L — ABNORMAL HIGH (ref 101–111)
GFR calc non Af Amer: 59 mL/min — ABNORMAL LOW (ref >60)
GLUCOSE: 88 mg/dL (ref 65–99)
Potassium: 3.4 mmol/L — ABNORMAL LOW (ref 3.5–5.1)
SODIUM: 140 mmol/L (ref 135–145)
Total Bilirubin: 0.5 mg/dL (ref 0.3–1.2)
Total Protein: 7.2 g/dL (ref 6.5–8.1)

## 2016-03-14 MED ORDER — ACETAMINOPHEN 325 MG PO TABS: 650.0000 mg | ORAL_TABLET | Freq: Four times a day (QID) | ORAL | Status: AC | PRN

## 2016-03-14 MED ORDER — CLOZAPINE 50 MG PO TABS: 50.0000 mg | ORAL_TABLET | Freq: Two times a day (BID) | ORAL | 1 refills | Status: DC

## 2016-03-14 NOTE — Clinical Social Work Note (Signed)
Pt d/c today back to [facility]. Pt notified and states that he wants to go to a different facility as he is not happy there. CSW informed him that he is d/c today and would need to return there, but provided list of facilities for pt to pursue. He plans to contact his caseworker in [place] to assist. Pt's mother notified by voicemail regarding d/c. Facility will pick up pt this afternoon and are aware of home health PT and rolling walker.   Benay Pike, [place]

## 2016-03-14 NOTE — Care Management Note (Signed)
Case Management Note  Patient Details  Name: Mario Ortega MRN: [id number] Date of Birth: October 06, 1965    Expected Discharge Date:       03/14/2016           Expected Discharge Plan:  Assisted Living / Rest Home  In-House Referral:  Clinical Social Work  Discharge planning Services  CM Consult  Post Acute Care Choice:  NA Choice offered to:  NA  DME Arranged:    DME Agency:     HH Arranged:    [place] Agency:     Status of Service:  In process, will continue to follow  If discussed at Long Length of Stay Meetings, dates discussed:    Additional Comments: Patient discharging today, recommended for HHPT and RW. Spoke with [facility], who is active with Mario Ortega currently for [facility] and PT. RW to be delivered to room today by [facility]. [employer] notified and will obtain order for RW.  Hazleigh Mccleave, [name], RN 03/14/2016, 12:09 PM

## 2016-03-14 NOTE — [place] FL2 (Signed)
[place] MEDICAID FL2 LEVEL OF CARE SCREENING TOOL     IDENTIFICATION  Patient Name: Mario Ortega Birthdate: 01-Feb-1966 Sex: male Admission Date (Current Location): 03/09/2016  [place] and [place] Number:  [place] [id number] [place] and Address:  [place] [address], [place]      Provider Number: [id number]  Attending Physician Name and Address:  Wallis Bamberg, MD  Relative Name and Phone Number:       Current Level of Care: Hospital Recommended Level of Care: [facility] Prior Approval Number:    Date Approved/Denied:   PASRR Number:    Discharge Plan: Domiciliary (Rest home)    Current Diagnoses: Patient Active Problem List   Diagnosis Date Noted  . Upper abdominal pain   . Altered mental status 03/11/2016  . Thrombocytopenia ([place]) 03/09/2016  . AKI (acute kidney injury) ([place]) 03/09/2016  . CKD (chronic kidney disease), stage III 03/09/2016  . Lethargy 03/09/2016  . Dehydration 03/09/2016  . Tobacco use disorder 05/13/2015  . MRSA (methicillin resistant Staphylococcus aureus) 05/13/2015  . Schizophrenia ([place]) 05/12/2015  . Schizoaffective disorder, bipolar type ([place])   . HTN (hypertension) 05/09/2015  . GERD (gastroesophageal reflux disease) 05/09/2015  . GI bleed 04/07/2015    Orientation RESPIRATION BLADDER Height & Weight     Self, Time, Situation, Place  Normal Incontinent Weight: 182 lb 1.6 oz (82.6 kg) Height:  5\' 11"  (180.3 cm)  BEHAVIORAL SYMPTOMS/MOOD NEUROLOGICAL BOWEL NUTRITION STATUS  Other (Comment) (n/a)  (n/a) Colostomy Diet (Soft diet)  AMBULATORY STATUS COMMUNICATION OF NEEDS Skin   Limited Assist Verbally Normal                       Personal Care Assistance Level of Assistance  Bathing, Feeding, Dressing Bathing Assistance: Limited assistance Feeding assistance: Limited assistance Dressing Assistance: Limited assistance     Functional Limitations Info  Sight, Hearing,  Speech Sight Info: Adequate Hearing Info: Adequate Speech Info: Adequate    SPECIAL CARE FACTORS FREQUENCY  PT (By licensed PT)     PT Frequency: home health PT              Contractures Contractures Info: Not present    Additional Factors Info  Isolation Precautions Code Status Info: Full code Allergies Info: Penicillins Psychotropic Info: Haldol   Isolation Precautions Info: 03/10/16 mrsa pcr +      Current Medications (03/14/2016):  This is the current hospital active medication list Current Facility-Administered Medications  Medication Dose Route Frequency Provider Last Rate Last Dose  . acetaminophen (TYLENOL) tablet 650 mg  650 mg Oral Q6H PRN Vianne Bulls, MD       Or  . acetaminophen (TYLENOL) suppository 650 mg  650 mg Rectal Q6H PRN Ilene Qua Opyd, MD      . amLODipine (NORVASC) tablet 10 mg  10 mg Oral QHS Vianne Bulls, MD   10 mg at 03/13/16 2245  . benztropine (COGENTIN) tablet 1 mg  1 mg Oral BID Vianne Bulls, MD   1 mg at 03/14/16 1147  . carvedilol (COREG) tablet 25 mg  25 mg Oral BID WC Vianne Bulls, MD   25 mg at 03/14/16 1146  . Chlorhexidine Gluconate Cloth 2 % PADS 6 each  6 each Topical Q0600 Wallis Bamberg, MD   6 each at 03/14/16 0600  . cholecalciferol (VITAMIN D) tablet 2,000 Units  2,000 Units Oral Daily Vianne Bulls, MD   2,000  Units at 03/14/16 1147  . cloZAPine (CLOZARIL) tablet 250 mg  250 mg Oral QHS Wallis Bamberg, MD   250 mg at 03/13/16 2244  . cloZAPine (CLOZARIL) tablet 75 mg  75 mg Oral Daily Wallis Bamberg, MD   75 mg at 03/14/16 1147  . desmopressin (DDAVP) tablet 0.4 mg  0.4 mg Oral QHS Ilene Qua Opyd, MD   0.4 mg at 03/13/16 2244  . folic acid (FOLVITE) tablet 2 mg  2 mg Oral Daily Vianne Bulls, MD   2 mg at 03/14/16 1147  . glycopyrrolate (ROBINUL) tablet 1 mg  1 mg Oral TID Vianne Bulls, MD   1 mg at 03/14/16 1146  . haloperidol (HALDOL) tablet 10 mg  10 mg Oral Daily Vianne Bulls, MD   10 mg at  03/13/16 1704  . haloperidol (HALDOL) tablet 10 mg  10 mg Oral QHS Ilene Qua Opyd, MD   10 mg at 03/13/16 2245  . haloperidol (HALDOL) tablet 5 mg  5 mg Oral Q breakfast Vianne Bulls, MD   5 mg at 03/14/16 1146  . ipratropium (ATROVENT) 0.06 % nasal spray 2 spray  2 spray Each Nare Q12H Vianne Bulls, MD   2 spray at 03/13/16 1211  . labetalol (NORMODYNE,TRANDATE) injection 10 mg  10 mg Intravenous Q2H PRN Vianne Bulls, MD      . multivitamin with minerals tablet   Oral Daily Vianne Bulls, MD      . mupirocin ointment (BACTROBAN) 2 % 1 application  1 application Nasal BID Wallis Bamberg, MD   1 application at [id number]  . ondansetron (ZOFRAN) tablet 4 mg  4 mg Oral Q6H PRN Vianne Bulls, MD       Or  . ondansetron (ZOFRAN) injection 4 mg  4 mg Intravenous Q6H PRN Ilene Qua Opyd, MD      . polyethylene glycol (MIRALAX / GLYCOLAX) packet 17 g  17 g Oral Daily PRN Vianne Bulls, MD      . sertraline (ZOLOFT) tablet 100 mg  100 mg Oral QHS Vianne Bulls, MD   100 mg at 03/13/16 2245     Discharge Medications:   Medication List    STOP taking these medications   Valproic Acid 250 MG Cpdr    TAKE these medications   acetaminophen 325 MG tablet Commonly known as:  TYLENOL Take 2 tablets (650 mg total) by mouth every 6 (six) hours as needed for mild pain (or Fever >/= 101).  amLODipine 10 MG tablet Commonly known as:  NORVASC Take 10 mg by mouth at bedtime.  benztropine 1 MG tablet Commonly known as:  COGENTIN Take 1 mg by mouth 2 (two) times daily.  clozapine 50 MG tablet Commonly known as:  CLOZARIL Take 1 tablet (50 mg total) by mouth 2 (two) times daily. 1 tablet PO in am and 2.5 tablets PO qPM for four days then decrease to 0.5tab PO qAM and 1.5 tab PO qPM for 4 days and then 1 tab PO qPm for 4 days What changed:  medication strength  how much to take  additional instructions  COREG 25 MG tablet Generic drug:  carvedilol Take 25 mg by mouth 2 (two) times  daily with a meal.  desmopressin 0.2 MG tablet Commonly known as:  DDAVP Take 0.4 mg by mouth at bedtime.  divalproex 250 MG DR tablet Commonly known as:  DEPAKOTE Take 750 mg by mouth every morning.  divalproex 500 MG 24 hr tablet Commonly known as:  DEPAKOTE ER Take 1,000 mg by mouth at bedtime.  folic acid 1 MG tablet Commonly known as:  FOLVITE Take 2 mg by mouth daily.  glycopyrrolate 1 MG tablet Commonly known as:  ROBINUL Take 1 mg by mouth 3 (three) times daily.  haloperidol 10 MG tablet Commonly known as:  HALDOL Take 5-10 mg by mouth 3 (three) times daily. Take 5mg  in the morning, then take 10mg  by mouth at 2:00pm, and at bedtime.  ipratropium 0.03 % nasal spray Commonly known as:  ATROVENT Place 2 sprays into both nostrils every 12 (twelve) hours.  sertraline 100 MG tablet Commonly known as:  ZOLOFT Take 100 mg by mouth at bedtime.  THEREMS-M Tabs Take 1 tablet by mouth daily.  Vitamin D3 2000 units Tabs Take 1 tablet by mouth daily.       Relevant Imaging Results:  Relevant Lab Results:   Additional Information    [name], [place]

## 2016-03-14 NOTE — Progress Notes (Signed)
Physical Therapy Treatment Patient Details Name: Mario Ortega MRN: [id number] DOB: [birth year] Today's Date: 03/14/2016    History of Present Illness 50 y.o. male with medical history significant for schizophrenia, chronic kidney disease stage III, hypertension, and macrocytic anemia who presents to the emergency department from his nursing facility for evaluation of low platelets and lethargy. Patient had reportedly been in his usual state until approximately 3 days ago when he was noted to be more lethargic and not eating.  EKG demonstrates a sinus rhythm with incomplete right bundle branch block and noncontrast head CT is negative for acute intracranial abnormality. Chemistry panels notable for a hypernatremia, hyperchloremia, and serum creatinine 1.82, up from 1.48 two weeks prior. Valproic acid level is elevated to 112. Patient became agitated in the emergency department and was treated with a dose of IV Ativan. CT of the abdomen and pelvis was obtained and there is a decompressed IVC consistent with dehydration and no emergent findings. Patient was given a 1 L normal saline bolus and continued on an aggressive IV hydration with 250 cc/hr. he remained hemodynamically stable in the ED and in no respiratory distress. He will be observed on the medical/surgical unit for ongoing evaluation and management of lethargy, loss of appetite, and thrombocytopenia, possibly secondary to Depakote use with mild toxicity.    PT Comments    Pt sitting in recliner upon arrival.  Pt reporting he is suppose to be discharged but "not going back to that place".  Pt requests to walk and states he is hungry.  Pt able to complete 500+ feet with RW and CGA.  Pt requires cues to look ahead and advance feet further with ambulation.  Attempted SPC, however seems to confuse patient with sequencing and unable to maintain balance with turns and stops.   Pt also tends to scissor his Rt LE over his Lt at times when using the cane.   Pt is much safer at this point using RW.  Pt with impulsive movements and behaviors at times which compromise his safety.  Pt returned to recliner with chair alarm in place.     Follow Up Recommendations  Home health PT     Equipment Recommendations  Rolling walker with 5" wheels    Recommendations for Other Services       Precautions / Restrictions      Mobility  Bed Mobility                  Transfers Overall transfer level: Needs assistance Equipment used: Rolling walker (2 wheeled) Transfers: Sit to/from Stand Sit to Stand: Supervision            Ambulation/Gait Ambulation/Gait assistance: Min guard Ambulation Distance (Feet): 1000 Feet Assistive device: Rolling walker (2 wheeled) Gait Pattern/deviations: Step-through pattern;Decreased dorsiflexion - right;Decreased dorsiflexion - left;Scissoring;Staggering left;Staggering right;Trunk flexed   Gait velocity interpretation: Below normal speed for age/gender General Gait Details: Pt with impulsitivity.  Pt tends to loose balance with turns and max assist to look ahead instead of down.     Stairs            Wheelchair Mobility    Modified Rankin (Stroke Patients Only)       Balance                                    Cognition Arousal/Alertness: Awake/alert Behavior During Therapy: Restless Overall Cognitive Status: Impaired/Different from baseline Area of  Impairment: Orientation Orientation Level: Disoriented to;Situation;Time                  Exercises      General Comments        Pertinent Vitals/Pain Pain Assessment: No/denies pain    Home Living                      Prior Function            PT Goals (current goals can now be found in the care plan section) Acute Rehab PT Goals PT Goal Formulation: Patient unable to participate in goal setting Time For Goal Achievement: 03/20/16 Potential to Achieve Goals: Good Progress towards PT goals:  Progressing toward goals    Frequency       PT Plan      Co-evaluation             End of Session Equipment Utilized During Treatment: Gait belt Activity Tolerance: Patient tolerated treatment well Patient left: in chair;with call bell/phone within reach;with chair alarm set     Time: [id number] PT Time Calculation (min) (ACUTE ONLY): 23 min  Charges:  $Gait Training: 23-37 mins                    G CodesTeena Irani, PTA/CLT [phone]  03/14/2016, 4:50 PM

## 2016-03-14 NOTE — Discharge Summary (Addendum)
Physician Discharge Summary  Mario Ortega [id number] DOB: [birth year] DOA: 03/09/2016  PCP: Christie Nottingham., PA  Admit date: 03/09/2016 Discharge date: 03/14/2016  Admitted From: Assisted Living Disposition: will discharge to family to go home with family   Recommendations for Outpatient Follow-up:  1. Follow up with PCP in 1 week to discuss medication management 2. Please obtain BMP/CBCD in one week 3. Make appointment with Hematology/ Oncology for follow up  Home Health: Yes Equipment/Devices:Walker  Discharge Condition: Stable and improved CODE STATUS:Full Code Diet recommendation: Regular  Brief/Interim Summary: Mario Hesteris a 50 y.o.malewith medical history significant forschizophrenia, chronic kidney disease stage III, hypertension, and macrocytic anemia who presents to the emergency department from his nursing facility for evaluation of low platelets and lethargy. Patient had reportedly been in his usual state until approximately 3 days ago when he was noted to be more lethargic and not eating. He was evaluated in the emergency department 2 days ago for this, noted to have thrombocytopenia, but was otherwise stable, had no complaints himself, and was returned to his facility. Since his return, his lethargy is increased and he continues to refuse food. He has denied recent fevers, chills, chest pain, palpitations, dyspnea, or cough. He is also denied abdominal pain. Upon arrival to the ED, patient is found to be afebrile, saturating well on room air, hypertensive in the 140/100 range, and vitals otherwise stable. EKG demonstrates a sinus rhythm with incomplete right bundle branch block and noncontrast head CT is negative for acute intracranial abnormality. Chemistry panels notable for a hypernatremia, hyperchloremia, and serum creatinine 1.82, up from 1.48 twoweeks prior. Valproic acid level is elevated to 112. Patient became agitated in the emergency department and was  treated with a dose of IV Ativan. CT of the abdomen and pelvis was obtained and there is a decompressed IVC consistent with dehydration and no emergent findings. Patient was given a 1 L normal saline bolus and continued on an aggressive IV hydration with 250 cc/hr. he remained hemodynamically stable in the ED and in no respiratory distress.  Platelets continued to decline and patient was started on IVIG.  He did slowly become more awake and alert with the taper of Clozapine and the discontinuation of Valproic Acid.  Discharge Diagnoses:  Principal Problem:   Altered mental status Active Problems:   HTN (hypertension)   GERD (gastroesophageal reflux disease)   Schizophrenia (HCC)   Thrombocytopenia (HCC)   AKI (acute kidney injury) ([place])   CKD (chronic kidney disease), stage III   Lethargy   Dehydration   Upper abdominal pain  Lethargy, loss of appetite  - Uncertain etiology, mild Depakote toxicity considered given elevated drug level  - Head CT is negative for acute intracranial abnormality  - Plan to hold Depakote -TSH, B12, both normal - provide IVF hydration - Blood cultures x 2- NGTD - ABG was mostly within normal limits (slightly low O2) - ammonia level WNL - UA WNL - patient more awake and alert today- able to participate in full conversation - will order PT eval  Hypernatremia - Na back to normal range  Thrombocytopenia  - Platelet count 32,000 yesterday (improved to 80 today)  - IVIG administered at 1g/kg for the past two days - Clozapine to be slowly tapered and then discontinued - No signs of bleeding - Had been 53k last month, but previously wnl  - Other cell lines remain stable; no evidence for infectious process  - Could be secondary to Depakote or Clozapine - will  follow up with Heme/ Onc after discharge  AKI superimposed on CKD stage III  - SCr 1.40 today - improved over hospitalization   Macrocytic anemia  - slight decrease in H/H today-will need  repeat within 1 week  Schizophrenia  - Difficult to assess on admission d/t his lethargy  - Has required inpatient treatment on multiple occasions and was incarcerated for murdering his grandfather  - Continue Haldol, Cogentin, Zoloft as patient tolerates - slowly taper Clozaril to discontinue - Depakote held as above - TTS consult placed- medication management guidance appreciated - Recommendations from psych are Haldol 10mg  BID and Ativan 2mg  BID PRN agitation - will start Clozapin taper  Hypertension - Elevated beyond goal at time of admission  - Managed with Norvasc and Coreg at home, will continue   Discharge Instructions  Discharge Instructions    Call MD for:  difficulty breathing, headache or visual disturbances    Complete by:  As directed   Call MD for:  extreme fatigue    Complete by:  As directed   Call MD for:  persistant dizziness or light-headedness    Complete by:  As directed   Diet - low sodium heart healthy    Complete by:  As directed   Increase activity slowly    Complete by:  As directed       Medication List    STOP taking these medications   Valproic Acid 250 MG Cpdr     TAKE these medications   acetaminophen 325 MG tablet Commonly known as:  TYLENOL Take 2 tablets (650 mg total) by mouth every 6 (six) hours as needed for mild pain (or Fever >/= 101).   amLODipine 10 MG tablet Commonly known as:  NORVASC Take 10 mg by mouth at bedtime.   benztropine 1 MG tablet Commonly known as:  COGENTIN Take 1 mg by mouth 2 (two) times daily.   clozapine 50 MG tablet Commonly known as:  CLOZARIL Take 1 tablet (50 mg total) by mouth 2 (two) times daily. 1 tablet PO in am and 2.5 tablets PO qPM for four days then decrease to 0.5tab PO qAM and 1.5 tab PO qPM for 4 days and then 1 tab PO qPm for 4 days What changed:  medication strength  how much to take  additional instructions   COREG 25 MG tablet Generic drug:  carvedilol Take 25 mg by mouth 2  (two) times daily with a meal.   desmopressin 0.2 MG tablet Commonly known as:  DDAVP Take 0.4 mg by mouth at bedtime.   divalproex 250 MG DR tablet Commonly known as:  DEPAKOTE Take 750 mg by mouth every morning.   divalproex 500 MG 24 hr tablet Commonly known as:  DEPAKOTE ER Take 1,000 mg by mouth at bedtime.   folic acid 1 MG tablet Commonly known as:  FOLVITE Take 2 mg by mouth daily.   glycopyrrolate 1 MG tablet Commonly known as:  ROBINUL Take 1 mg by mouth 3 (three) times daily.   haloperidol 10 MG tablet Commonly known as:  HALDOL Take 5-10 mg by mouth 3 (three) times daily. Take 5mg  in the morning, then take 10mg  by mouth at 2:00pm, and at bedtime.   ipratropium 0.03 % nasal spray Commonly known as:  ATROVENT Place 2 sprays into both nostrils every 12 (twelve) hours.   sertraline 100 MG tablet Commonly known as:  ZOLOFT Take 100 mg by mouth at bedtime.   THEREMS-M Tabs Take 1 tablet by  mouth daily.   Vitamin D3 2000 units Tabs Take 1 tablet by mouth daily.      Follow-up Information    Christie Nottingham., PA Follow up in 1 week(s).   Specialty:  Physician Assistant Why:  Discuss medication management with decreasing clozapine Contact information: [place] [address] [phone]        Molli Hazard, MD Follow up in 1 month(s).   Specialties:  Hematology and Oncology, Oncology Contact information: [place] [address] [phone]          Allergies  Allergen Reactions  . Penicillins Rash    Has patient had a PCN reaction causing immediate rash, facial/tongue/throat swelling, SOB or lightheadedness with hypotension: No Has patient had a PCN reaction causing severe rash involving mucus membranes or skin necrosis: No Has patient had a PCN reaction that required hospitalization No Has patient had a PCN reaction occurring within the last 10 years: No If all of the above answers are "NO", then may proceed  with Cephalosporin use.     Consultations:  TTS   Procedures/Studies: Dg Chest 2 View  Result Date: 03/07/2016 CLINICAL DATA:  Weakness EXAM: CHEST  2 VIEW COMPARISON:  None. FINDINGS: The heart size and mediastinal contours are within normal limits. Both lungs are clear. The visualized skeletal structures are unremarkable. Nipple shadows are noted bilaterally. IMPRESSION: No active cardiopulmonary disease. Electronically Signed   By: Inez Catalina M.D.   On: 03/07/2016 16:18   Ct Head Wo Contrast  Result Date: 03/09/2016 CLINICAL DATA:  Altered mental status. Decreased p.o. intake. Low platelets. EXAM: CT HEAD WITHOUT CONTRAST TECHNIQUE: Contiguous axial images were obtained from the base of the skull through the vertex without intravenous contrast. COMPARISON:  None. FINDINGS: Brain: There is mild cerebral atrophy which is advanced for age. There is no evidence of acute cortical infarct, intracranial hemorrhage, mass, midline shift, or extra-axial fluid collection. Vascular: No hyperdense vessel or unexpected calcification. Skull: Normal. Negative for fracture or focal lesion. Sinuses/Orbits: Right maxillary sinus mucous retention cysts. Minimal left maxillary sinus mucosal thickening. Clear mastoid air cells. Unremarkable orbits. Other: None. IMPRESSION: 1. No evidence of acute intracranial abnormality. 2. Premature cerebral atrophy. Electronically Signed   By: Logan Bores M.D.   On: 03/09/2016 21:41   Ct Abdomen Pelvis W Contrast  Result Date: 03/09/2016 CLINICAL DATA:  Acute onset of loss of appetite. Decreased hippocampal. Initial encounter. EXAM: CT ABDOMEN AND PELVIS WITH CONTRAST TECHNIQUE: Multidetector CT imaging of the abdomen and pelvis was performed using the standard protocol following bolus administration of intravenous contrast. CONTRAST:  179mL ISOVUE-300 IOPAMIDOL (ISOVUE-300) INJECTION 61% COMPARISON:  CT of the abdomen and pelvis from 04/07/2015 FINDINGS: Lower chest: Minimal  bibasilar atelectasis is noted. The visualized portions of the mediastinum are unremarkable. Hepatobiliary: The liver is grossly unremarkable in appearance. Stones are noted dependently within the gallbladder. The common bile duct remains normal in caliber. Pancreas: The pancreas is unremarkable in appearance. Spleen: The spleen is within normal limits. Adrenals/Urinary Tract: The adrenal glands are unremarkable in appearance. Small nonobstructing left renal stones are seen, measuring up to 2 mm in size. The kidneys are otherwise unremarkable. There is no evidence of hydronephrosis. No obstructing ureteral stones are identified. No perinephric stranding is seen. Stomach/Bowel: The stomach is unremarkable in appearance. The small bowel is grossly unremarkable. A right lower quadrant colostomy is unremarkable in appearance. The patient's Hartmann's pouch is within normal limits. Vascular/Lymphatic: Minimal calcification is seen along the  distal abdominal aorta and its branches. No acute vascular abnormalities are seen. There is partial decompression of the IVC, likely reflecting volume depletion. No retroperitoneal lymphadenopathy is seen. No pelvic sidewall lymphadenopathy is identified. Reproductive: The bladder is significantly distended and grossly unremarkable in appearance. The prostate remains normal in size. Musculoskeletal: No acute osseous abnormalities are identified. The visualized musculature is within normal limits. Vacuum phenomenon is noted at L5-S1. Other: Mild focal soft tissue inflammation is noted at the left lower back, overlying the left side of the sacrum, raising concern for a mild sacral decubitus ulceration. IMPRESSION: 1. Mild focal soft tissue inflammation at the left lower back, overlying the left side of the sacrum, concerning for a mild sacral decubitus ulceration. Would correlate clinically. 2. Partial decompression of the IVC likely reflects volume depletion (dehydration). 3. Small  nonobstructing left renal stones measure up to 2 mm in size. 4. Right lower quadrant colostomy is unremarkable. Hartmann's pouch is within normal limits. 5. Cholelithiasis.  Gallbladder otherwise grossly unremarkable. Electronically Signed   By: Garald Balding M.D.   On: 03/09/2016 21:45      Subjective: Patient sitting in chair today.  Is awake, alert and is conversant.  Voices concern about discharge back to prior facility.  Wondering about what his options are.  Wants to work, even for free, to contribute.  No abdominal pain, no blood in colostomy, no nausea, no chest pain or shortness of breath.    Discharge Exam: Vitals:   03/13/16 2120 03/14/16 0445  BP: 128/86 121/81  Pulse: 71 72  Resp: 18 18  Temp: 99.2 F (37.3 C) 98.6 F (37 C)   Vitals:   03/13/16 1327 03/13/16 1931 03/13/16 2120 03/14/16 0445  BP: 106/62  128/86 121/81  Pulse: 66  71 72  Resp: 20  18 18   Temp: 98.2 F (36.8 C)  99.2 F (37.3 C) 98.6 F (37 C)  TempSrc: Oral  Oral Oral  SpO2: 100% 95% 100% 99%  Weight:    82.6 kg (182 lb 1.6 oz)  Height:    5\' 11"  (1.803 m)    General: Pt is alert, awake, not in acute distress Cardiovascular: RRR, S1/S2 +, no rubs, no gallops Respiratory: CTA bilaterally, no wheezing, no rhonchi Abdominal: Soft, NT, ND, bowel sounds + Extremities: no edema, no cyanosis    The results of significant diagnostics from this hospitalization (including imaging, microbiology, ancillary and laboratory) are listed below for reference.     Microbiology: Recent Results (from the past 240 hour(s))  MRSA PCR Screening     Status: Abnormal   Collection Time: 03/10/16  1:15 AM  Result Value Ref Range Status   MRSA by PCR POSITIVE (A) NEGATIVE Final    Comment:        The GeneXpert MRSA Assay (FDA approved for NASAL specimens only), is one component of a comprehensive MRSA colonization surveillance program. It is not intended to diagnose MRSA infection nor to guide or monitor  treatment for MRSA infections. RESULT CALLED TO, READ BACK BY AND VERIFIED WITH: ROSE K AT 0333 ON [id number] BY FORSYTH K   Culture, blood (routine x 2)     Status: None (Preliminary result)   Collection Time: 03/10/16  6:36 PM  Result Value Ref Range Status   Specimen Description BLOOD LEFT ARM  Final   Special Requests BOTTLES DRAWN AEROBIC AND ANAEROBIC 6CC EACH  Final   Culture NO GROWTH 4 DAYS  Final   Report Status PENDING  Incomplete  Culture, blood (routine x 2)     Status: None (Preliminary result)   Collection Time: 03/10/16  6:38 PM  Result Value Ref Range Status   Specimen Description BLOOD LEFT HAND  Final   Special Requests BOTTLES DRAWN AEROBIC AND ANAEROBIC 6CC EACH  Final   Culture NO GROWTH 4 DAYS  Final   Report Status PENDING  Incomplete     Labs: BNP (last 3 results) No results for input(s): BNP in the last 8760 hours. Basic Metabolic Panel:  Recent Labs Lab 03/11/16 1240 03/12/16 0635 03/12/16 1825 03/13/16 0643 03/14/16 0541  NA 149* 146* 145 142 140  K 3.6 3.7 3.5 3.4* 3.4*  CL 120* 119* 115* 114* 112*  CO2 27 27 26 26 27   GLUCOSE 133* 83 138* 93 88  BUN 24* 20 19 18 16   CREATININE 1.41* 1.42* 1.54* 1.40* 1.37*  CALCIUM 9.6 8.9 9.0 8.9 8.7*   Liver Function Tests:  Recent Labs Lab 03/09/16 1836 03/14/16 0541  AST 32 33  ALT 23 26  ALKPHOS 68 54  BILITOT 0.6 0.5  PROT 6.2* 7.2  ALBUMIN 3.2* 2.4*    Recent Labs Lab 03/09/16 1836  LIPASE 41    Recent Labs Lab 03/10/16 1703  AMMONIA 13   CBC:  Recent Labs Lab 03/10/16 0636 03/11/16 0627 03/12/16 0635 03/13/16 0703 03/14/16 0541  WBC 6.2 6.9 5.2 5.1 6.3  NEUTROABS 3.2 3.2 2.7 1.8 2.0  HGB 11.3* 11.4* 10.5* 10.7* 8.8*  HCT 34.8*  33.7* 35.5* 32.3* 33.5* 27.7*  MCV 103.9* 103.8* 102.9* 103.4* 103.7*  PLT 43* 32* 43* 50* 88*   Cardiac Enzymes: No results for input(s): CKTOTAL, CKMB, CKMBINDEX, TROPONINI in the last 168 hours. BNP: Invalid input(s):  POCBNP CBG:  Recent Labs Lab 03/11/16 0748 03/11/16 2155 03/12/16 0802 03/13/16 0734 03/14/16 0754  GLUCAP 70 87 84 127* 85   D-Dimer No results for input(s): DDIMER in the last 72 hours. Hgb A1c No results for input(s): HGBA1C in the last 72 hours. Lipid Profile No results for input(s): CHOL, HDL, LDLCALC, TRIG, CHOLHDL, LDLDIRECT in the last 72 hours. Thyroid function studies No results for input(s): TSH, T4TOTAL, T3FREE, THYROIDAB in the last 72 hours.  Invalid input(s): FREET3 Anemia work up No results for input(s): VITAMINB12, FOLATE, FERRITIN, TIBC, IRON, RETICCTPCT in the last 72 hours. Urinalysis    Component Value Date/Time   COLORURINE YELLOW 03/07/2016 1409   APPEARANCEUR CLEAR 03/07/2016 1409   LABSPEC 1.020 03/07/2016 1409   PHURINE 6.0 03/07/2016 1409   GLUCOSEU NEGATIVE 03/07/2016 1409   HGBUR NEGATIVE 03/07/2016 1409   BILIRUBINUR NEGATIVE 03/07/2016 1409   KETONESUR NEGATIVE 03/07/2016 1409   PROTEINUR NEGATIVE 03/07/2016 1409   NITRITE NEGATIVE 03/07/2016 1409   LEUKOCYTESUR NEGATIVE 03/07/2016 1409   Sepsis Labs Invalid input(s): PROCALCITONIN,  WBC,  LACTICIDVEN Microbiology Recent Results (from the past 240 hour(s))  MRSA PCR Screening     Status: Abnormal   Collection Time: 03/10/16  1:15 AM  Result Value Ref Range Status   MRSA by PCR POSITIVE (A) NEGATIVE Final    Comment:        The GeneXpert MRSA Assay (FDA approved for NASAL specimens only), is one component of a comprehensive MRSA colonization surveillance program. It is not intended to diagnose MRSA infection nor to guide or monitor treatment for MRSA infections. RESULT CALLED TO, READ BACK BY AND VERIFIED WITH: ROSE K AT 0333 ON [id number] BY FORSYTH K   Culture, blood (routine x 2)  Status: None (Preliminary result)   Collection Time: 03/10/16  6:36 PM  Result Value Ref Range Status   Specimen Description BLOOD LEFT ARM  Final   Special Requests BOTTLES DRAWN AEROBIC AND  ANAEROBIC 6CC EACH  Final   Culture NO GROWTH 4 DAYS  Final   Report Status PENDING  Incomplete  Culture, blood (routine x 2)     Status: None (Preliminary result)   Collection Time: 03/10/16  6:38 PM  Result Value Ref Range Status   Specimen Description BLOOD LEFT HAND  Final   Special Requests BOTTLES DRAWN AEROBIC AND ANAEROBIC [place]  Final   Culture NO GROWTH 4 DAYS  Final   Report Status PENDING  Incomplete     Time coordinating discharge: Over 30 minutes  SIGNED:   Newman Pies, MD  [facility] 03/14/2016, 3:42 PM Pager [phone] If 7PM-7AM, please contact night-coverage [email or web address] Password [email or web address]

## 2016-03-15 DIAGNOSIS — R4182 Altered mental status, unspecified: Secondary | ICD-10-CM

## 2016-03-15 DIAGNOSIS — F250 Schizoaffective disorder, bipolar type: Secondary | ICD-10-CM | POA: Diagnosis not present

## 2016-03-15 LAB — CULTURE, BLOOD (ROUTINE X 2)
CULTURE: NO GROWTH
CULTURE: NO GROWTH

## 2016-03-15 LAB — GLUCOSE, CAPILLARY: Glucose-Capillary: 129 mg/dL — ABNORMAL HIGH (ref 65–99)

## 2016-03-15 NOTE — Clinical Social Work Note (Signed)
Pt was d/c yesterday, but when facility came to pick him up he refused to go with them. Pt stating that facility was trying to kill him, so psych consult requested for paranoia and capacity. Pt does not meet inpatient criteria. Awaiting capacity evaluation. CSW called facility this morning and no answer. This afternoon, they notified CSW that they have filled his bed because he refused to return. Discussed with supervisor. Will initiate new bed search. MD and RN notified.   Benay Pike, [place]

## 2016-03-15 NOTE — Consult Note (Signed)
[facility] Telepsychiatry Consult   Reason for Consult:  Follow-up consult note for this 50 year old man who was brought to the emergency room from his group home because of agitation and schizophrenia and has been treated here for several days Referring Physician:  [facility] Patient Identification: Mario Ortega MRN:  [id number] Principal Diagnosis: Altered mental status Diagnosis:   Patient Active Problem List   Diagnosis Date Noted  . Upper abdominal pain [R10.10]   . Altered mental status [R41.82] 03/11/2016  . Thrombocytopenia ([place]) [D69.6] 03/09/2016  . AKI (acute kidney injury) ([place]) [N17.9] 03/09/2016  . CKD (chronic kidney disease), stage III [N18.3] 03/09/2016  . Lethargy [R53.83] 03/09/2016  . Dehydration [E86.0] 03/09/2016  . Tobacco use disorder [F17.200] 05/13/2015  . MRSA (methicillin resistant Staphylococcus aureus) [A49.02] 05/13/2015  . Schizophrenia ([place]) [F20.9] 05/12/2015  . Schizoaffective disorder, bipolar type ([place]) [F25.0]   . HTN (hypertension) [I10] 05/09/2015  . GERD (gastroesophageal reflux disease) [K21.9] 05/09/2015  . GI bleed [K92.2] 04/07/2015    Total Time spent with patient: 50 minutes  Subjective:   Dorance Ortega is a 50 y.o. male patient admitted to the hospital with hx of schizoaffective bipolar, unstable.  Pt seen and chart reviewed. Pt is alert/oriented x4, calm, cooperative, and appropriate to situation. Pt denies suicidal/homicidal ideation and psychosis and does not appear to be responding to internal stimuli. Pt reports that he does not take prescription medications at this time but would like to. I asked if he takes psychiatric medications and he reports yes but cannot recall who prescribes them. He answers orientation questions appropriately but does appear to be mildly confused. After assessment, it is determined that this confusion does not appear to be psychiatric in origin and may be either baseline or another form of organic etiology. However,  it is not to the point that the pt would be considered committable under IVC.   HPI:  I have reviewed and concur with HPI elements below, modified as follows:  Mario Hesteris a 50 y.o.malewith medical history significant forschizophrenia, chronic kidney disease stage III, hypertension, and macrocytic anemia who presents to the emergency department from his nursing facility for evaluation of low platelets and lethargy. Patient had reportedly been in his usual state until approximately 3 days ago when he was noted to be more lethargic and not eating. He was evaluated in the emergency department 2 days ago for this, noted to have thrombocytopenia, but was otherwise stable, had no complaints himself, and was returned to his facility. Since his return, his lethargy is increased and he continues to refuse food. He has denied recent fevers, chills, chest pain, palpitations, dyspnea, or cough. He is also denied abdominal pain. Upon arrival to the ED, patient is found to be afebrile, saturating well on room air, hypertensive in the 140/100 range, and vitals otherwise stable. EKG demonstrates a sinus rhythm with incomplete right bundle branch block and noncontrast head CT is negative for acute intracranial abnormality. Chemistry panels notable for a hypernatremia, hyperchloremia, and serum creatinine 1.82, up from 1.48 twoweeks prior. Valproic acid level is elevated to 112. Patient became agitated in the emergency department and was treated with a dose of IV Ativan. CT of the abdomen and pelvis was obtained and there is a decompressed IVC consistent with dehydration and no emergent findings. Patient was given a 1 L normal saline bolus and continued on an aggressive IV hydration with 250 cc/hr. he remained hemodynamically stable in the ED and in no respiratory distress.  Platelets continued to decline  and patient was started on IVIG.  He did slowly become more awake and alert with the taper of Clozapine and the  discontinuation of Valproic Acid.  Per request of [place] provider, we have done a functional capacity assessment specifically regarding the patient's ability to make informed decisions about medical placement and living arrangements.   Mental Capacity Assessment:  I have evaluated the following areas to assess the Armstrong Harpole's mental capacity regarding medical decision-making ability which pertains to competency to accept or refuse medical treatment as mentioned in detail above.    The specific treatment or service in question is: the ability to make decisions regarding his own care in regard to housing arrangements (e.g., shelter vs. Care home, etc.).   Communication: The patient was able to clearly state preferred treatment options for his medical care at this time. The patient was able to decide that he wants to stay with his cousin in [place]. His cousin's name is Mario Ortega and he reports that he can reach his cousin with the telephone numbers in his belongings.   Understanding: The patient was able to recall information, link causal relationships, and process general probabilities regarding life situations and medical treatment scenarios. He was able to paraphrase his view of the current situation and his thoughts about it including future-oriented scenarios. The patient did-not present with impairments in memory, yet did have very mild impairments in attention span.  Appreciation: The patient was able to identify and describe various illnesses and treatment options with potential outcomes. He could not use medical terminology for these concerns but was descriptive enough to convey the nature of them. The patient did-not present with concerns such as denial or delusional thought-process.   Rationalization: The patient was able to weigh risks and benefits and come to a conclusion congruent with patient's perceived goals, particularly in regard to financial status and receiving assistance from  his cousin. At the time of this assessment, the patient is not experiencing an acute medical scenario with organic etiology which would directly compromise functional decision-making capacity although this may change according to additional testing should the primary provider deem such evaluation necessary.  Conclusion: At this time, there is insufficient evidence to warrant removal of the patient's rights for medical decision-making. At this time, we can determine that the patient does have functional mental capacity for medical decision-making including the right to accept or refuse specific medical treatment as mentioned in detail above. Functional capacity assessments are specific and should not be viewed as a general capacity to make other decisions (e.g., patient may understand need for tylenol but not the need for exploratory laparotomy and risks involved in such). Please consult with psychiatry if necessary, after resolving underlying transient medical concerns.    Risk to Self: Is patient at risk for suicide?: No (UTA) Risk to Others:   Prior Inpatient Therapy:   Prior Outpatient Therapy:    Past Medical History:  Past Medical History:  Diagnosis Date  . Anemia    "Nacrocytic"  . Colostomy in place [facility])   . Drug abuse   . Edentulous   . GERD (gastroesophageal reflux disease)   . Hypertension   . MRSA (methicillin resistant staph aureus) culture positive   . Schizo affective schizophrenia [facility])     Past Surgical History:  Procedure Laterality Date  . COLOSTOMY     Family History: History reviewed. No pertinent family history. Family Psychiatric  History: Denies any family history but details are unknown and his history is poor  Social History:  History  Alcohol Use No     History  Drug Use No    Social History   Social History  . Marital status: Single    Spouse name: N/A  . Number of children: N/A  . Years of education: N/A   Social History Main Topics  . Smoking  status: Current Every Day Smoker  . Smokeless tobacco: Never Used  . Alcohol use No  . Drug use: No  . Sexual activity: Not Asked   Other Topics Concern  . None   Social History Narrative  . None   Additional Social History:                          Allergies:   Allergies  Allergen Reactions  . Penicillins Rash    Has patient had a PCN reaction causing immediate rash, facial/tongue/throat swelling, SOB or lightheadedness with hypotension: No Has patient had a PCN reaction causing severe rash involving mucus membranes or skin necrosis: No Has patient had a PCN reaction that required hospitalization No Has patient had a PCN reaction occurring within the last 10 years: No If all of the above answers are "NO", then may proceed with Cephalosporin use.     Labs:  Results for orders placed or performed during the hospital encounter of 03/09/16 (from the past 48 hour(s))  CBC with Differential/Platelet     Status: Abnormal   Collection Time: 03/14/16  5:41 AM  Result Value Ref Range   WBC 6.3 4.0 - 10.5 K/uL   RBC 2.67 (L) 4.22 - 5.81 MIL/uL   Hemoglobin 8.8 (L) 13.0 - 17.0 g/dL   HCT 27.7 (L) 39.0 - 52.0 %   MCV 103.7 (H) 78.0 - 100.0 fL   MCH 33.0 26.0 - 34.0 pg   MCHC 31.8 30.0 - 36.0 g/dL   RDW 14.9 11.5 - 15.5 %   Platelets 88 (L) 150 - 400 K/uL    Comment: SPECIMEN CHECKED FOR CLOTS CONSISTENT WITH PREVIOUS RESULT    Neutrophils Relative % 31 %   Lymphocytes Relative 42 %   Monocytes Relative 23 %   Eosinophils Relative 4 %   Basophils Relative 0 %   Neutro Abs 2.0 1.7 - 7.7 K/uL   Lymphs Abs 2.6 0.7 - 4.0 K/uL   Monocytes Absolute 1.4 (H) 0.1 - 1.0 K/uL   Eosinophils Absolute 0.3 0.0 - 0.7 K/uL   Basophils Absolute 0.0 0.0 - 0.1 K/uL  Comprehensive metabolic panel     Status: Abnormal   Collection Time: 03/14/16  5:41 AM  Result Value Ref Range   Sodium 140 135 - 145 mmol/L   Potassium 3.4 (L) 3.5 - 5.1 mmol/L   Chloride 112 (H) 101 - 111 mmol/L    CO2 27 22 - 32 mmol/L   Glucose, Bld 88 65 - 99 mg/dL   BUN 16 6 - 20 mg/dL   Creatinine, Ser 1.37 (H) 0.61 - 1.24 mg/dL   Calcium 8.7 (L) 8.9 - 10.3 mg/dL   Total Protein 7.2 6.5 - 8.1 g/dL   Albumin 2.4 (L) 3.5 - 5.0 g/dL   AST 33 15 - 41 U/L   ALT 26 17 - 63 U/L   Alkaline Phosphatase 54 38 - 126 U/L   Total Bilirubin 0.5 0.3 - 1.2 mg/dL   GFR calc non Af Amer 59 (L) >60 mL/min   GFR calc Af Amer >60 >60 mL/min    Comment: (NOTE)  The eGFR has been calculated using the CKD EPI equation. This calculation has not been validated in all clinical situations. eGFR's persistently <60 mL/min signify possible Chronic Kidney Disease.   Glucose, capillary     Status: None   Collection Time: 03/14/16  7:54 AM  Result Value Ref Range   Glucose-Capillary 85 65 - 99 mg/dL   Comment 1 Notify RN    Comment 2 Document in Chart   Glucose, capillary     Status: Abnormal   Collection Time: 03/15/16  7:53 AM  Result Value Ref Range   Glucose-Capillary 129 (H) 65 - 99 mg/dL    Current Facility-Administered Medications  Medication Dose Route Frequency Provider Last Rate Last Dose  . acetaminophen (TYLENOL) tablet 650 mg  650 mg Oral Q6H PRN Vianne Bulls, MD       Or  . acetaminophen (TYLENOL) suppository 650 mg  650 mg Rectal Q6H PRN Vianne Bulls, MD      . amLODipine (NORVASC) tablet 10 mg  10 mg Oral QHS Vianne Bulls, MD   10 mg at 03/14/16 2224  . benztropine (COGENTIN) tablet 1 mg  1 mg Oral BID Vianne Bulls, MD   1 mg at 03/15/16 0828  . carvedilol (COREG) tablet 25 mg  25 mg Oral BID WC Vianne Bulls, MD   25 mg at 03/15/16 0827  . cholecalciferol (VITAMIN D) tablet 2,000 Units  2,000 Units Oral Daily Vianne Bulls, MD   2,000 Units at 03/15/16 0827  . cloZAPine (CLOZARIL) tablet 250 mg  250 mg Oral QHS Wallis Bamberg, MD   250 mg at 03/14/16 2227  . cloZAPine (CLOZARIL) tablet 75 mg  75 mg Oral Daily Wallis Bamberg, MD   75 mg at 03/15/16 0827  . desmopressin (DDAVP)  tablet 0.4 mg  0.4 mg Oral QHS Ilene Qua Opyd, MD   0.4 mg at 03/14/16 2200  . folic acid (FOLVITE) tablet 2 mg  2 mg Oral Daily Vianne Bulls, MD   2 mg at 03/15/16 6269  . glycopyrrolate (ROBINUL) tablet 1 mg  1 mg Oral TID Vianne Bulls, MD   1 mg at 03/15/16 0827  . haloperidol (HALDOL) tablet 10 mg  10 mg Oral Daily Vianne Bulls, MD   10 mg at 03/14/16 1642  . haloperidol (HALDOL) tablet 10 mg  10 mg Oral QHS Vianne Bulls, MD   10 mg at 03/14/16 2242  . haloperidol (HALDOL) tablet 5 mg  5 mg Oral Q breakfast Vianne Bulls, MD   5 mg at 03/15/16 0828  . ipratropium (ATROVENT) 0.06 % nasal spray 2 spray  2 spray Each Nare Q12H Vianne Bulls, MD   2 spray at 03/14/16 1722  . labetalol (NORMODYNE,TRANDATE) injection 10 mg  10 mg Intravenous Q2H PRN Vianne Bulls, MD      . multivitamin with minerals tablet   Oral Daily Vianne Bulls, MD   1 tablet at 03/15/16 0827  . mupirocin ointment (BACTROBAN) 2 % 1 application  1 application Nasal BID Wallis Bamberg, MD   1 application at 48/54/62 2244  . ondansetron (ZOFRAN) tablet 4 mg  4 mg Oral Q6H PRN Vianne Bulls, MD       Or  . ondansetron (ZOFRAN) injection 4 mg  4 mg Intravenous Q6H PRN Ilene Qua Opyd, MD      . polyethylene glycol (MIRALAX / GLYCOLAX) packet 17 g  17 g  Oral Daily PRN Vianne Bulls, MD      . sertraline (ZOLOFT) tablet 100 mg  100 mg Oral QHS Vianne Bulls, MD   100 mg at 03/14/16 2226    Musculoskeletal: Strength & Muscle Tone: within normal limits Gait & Station: normal Patient leans: N/A  Psychiatric Specialty Exam: Review of Systems  Constitutional: Negative.   HENT: Negative.   Eyes: Negative.   Respiratory: Negative.   Cardiovascular: Negative.   Gastrointestinal: Negative.   Musculoskeletal: Negative.   Skin: Negative.   Neurological: Negative.   Psychiatric/Behavioral: Negative for depression, hallucinations, substance abuse and suicidal ideas. The patient has insomnia. The patient is not  nervous/anxious.   All other systems reviewed and are negative.   Blood pressure 125/79, pulse 69, temperature 98.5 F (36.9 C), temperature source Oral, resp. rate 18, height _0  (1.803 m), weight 78.2 kg (172 lb 8 oz), SpO2 99 %.Body mass index is 24.06 kg/m.  General Appearance: Disheveled  Eye Sport and exercise psychologist::  Fair  Speech:  Clear and Coherent and Normal Rate  Volume:  Decreased  Mood:  Euthymic  Affect:  Flat  Thought Process:  Descriptions of Associations: Loose  Orientation:  WDL, person/place/time/full x4  Thought Content:  Discharge plans, worries, concerns  Suicidal Thoughts:  No, denies  Homicidal Thoughts:  No, denies  Memory:  Immediate;   Fair Recent;   Fair Remote;   Fair  Judgement:  Fair  Insight:  Fair  Psychomotor Activity:  Normal  Concentration:  Fair  Recall:  [employer] of Knowledge:Fair  Language: Fair  Akathisia:  No  Handed:  Right  AIMS (if indicated):     Assets:  Resilience  ADL's:  Intact  Cognition: WNL  Sleep:      Treatment Plan Summary: Schizoaffective disorder, bipolar type ([place])  By history, and needs outpatient followup. Not acutely psychotic at this time. Please consult psychiatry if necessary. SW consult would be useful to refer pt to outpatient resources such as [facility]. May give pt printouts available in ED and SW can assist in providing these also. Pt does have functional mental capacity as specified in detail above (see narrative).   Disposition:  -Discharge home when medically cleared -Outpatient at provider such as Selinda Orion, FNP-BC 03/15/2016 9:30 AM

## 2016-03-15 NOTE — Progress Notes (Signed)
[facility] Progress Note  Patient: Mario Ortega [id number]   PCP: Christie Nottingham., PA DOB: [birth year]   DOA: 03/09/2016   DOS: 03/15/2016   Date of Service: the patient was seen and examined on 03/15/2016  Subjective: Patient denies any acute complaint. Is worried that the facility that he was from as not been treating him well and would not like to be discharged there. Nutrition: Tolerating oral diet  Brief hospital course: Pt. with PMH of schizophrenia, HTN, GERD; admitted on 03/09/2016, with complaint of confusion, was found to have Depakote toxicity. Currently further plan is continue monitoring and arrange for discharge facility.  Assessment and Plan: 1. Schizoaffective disorder, bipolar type [facility]) With concern regarding patient's capacity psychiatry was consulted. Psychiatric feels that the patient does have capacity to make medical decisions also psychiatry feels that the patient does not require any inpatient psychiatric treatment at present. At this point the plan as per my discussion with the social worker is to arrange for other facilities as a group home for the patient tomorrow. Continue current regimen.  2. Thrombocytopenia. Patient was given IVIG twice. As low platelets for last 1 month. Currently improving. No active bleeding reported.  3. Acute kidney injury. Renal function improving. Continue to monitor.  Pain management: When necessary Tylenol Activity: Home health PT as per physical therapy Bowel regimen: last BM 03/15/2016 Diet: Regular diet DVT Prophylaxis: Mechanical compression  Advance goals of care discussion: Full code  Family Communication: no family was present at bedside, at the time of interview.  Disposition:  Discharge to group home. Expected discharge date: 03/16/2016,   Consultants: Psychiatric Procedures: none  Antibiotics: Anti-infectives    None        Intake/Output Summary (Last 24 hours) at 03/15/16 1629 Last data  filed at 03/15/16 1456  Gross per 24 hour  Intake              240 ml  Output             3500 ml  Net            -3260 ml   Filed Weights   03/10/16 0506 03/14/16 0445 03/15/16 0500  Weight: 79.2 kg (174 lb 9.6 oz) 82.6 kg (182 lb 1.6 oz) 78.2 kg (172 lb 8 oz)    Objective: Physical Exam: Vitals:   03/14/16 0445 03/14/16 1925 03/14/16 2034 03/15/16 0500  BP: 121/81  129/74 125/79  Pulse: 72  72 69  Resp: 18  18 18   Temp: 98.6 F (37 C)  98.2 F (36.8 C) 98.5 F (36.9 C)  TempSrc: Oral  Oral Oral  SpO2: 99% 98% 100% 99%  Weight: 82.6 kg (182 lb 1.6 oz)   78.2 kg (172 lb 8 oz)  Height: 5\' 11"  (1.803 m)   5\' 11"  (1.803 m)    General: Alert, Awake and Oriented to Time, Place and Person. Appear in no distress Eyes: PERRL, Conjunctiva normal ENT: Oral Mucosa clear moist. Neck: no JVD, no Abnormal Mass Or lumps Cardiovascular: S1 and S2 Present, no Murmur, Respiratory: Bilateral Air entry equal and Decreased, Clear to Auscultation, no Crackles, no wheezes Abdomen: Bowel Sound present, Soft and no tenderness Skin: no redness, no Rash  Extremities: no Pedal edema, no calf tenderness Neurologic: Grossly no focal neuro deficit. Bilaterally Equal motor strength  Data Reviewed: CBC:  Recent Labs Lab 03/10/16 0636 03/11/16 0627 03/12/16 0635 03/13/16 0703 03/14/16 0541  WBC 6.2 6.9 5.2 5.1 6.3  NEUTROABS 3.2 3.2 2.7  1.8 2.0  HGB 11.3* 11.4* 10.5* 10.7* 8.8*  HCT 34.8*  33.7* 35.5* 32.3* 33.5* 27.7*  MCV 103.9* 103.8* 102.9* 103.4* 103.7*  PLT 43* 32* 43* 50* 88*   Basic Metabolic Panel:  Recent Labs Lab 03/11/16 1240 03/12/16 0635 03/12/16 1825 03/13/16 0643 03/14/16 0541  NA 149* 146* 145 142 140  K 3.6 3.7 3.5 3.4* 3.4*  CL 120* 119* 115* 114* 112*  CO2 27 27 26 26 27   GLUCOSE 133* 83 138* 93 88  BUN 24* 20 19 18 16   CREATININE 1.41* 1.42* 1.54* 1.40* 1.37*  CALCIUM 9.6 8.9 9.0 8.9 8.7*    Liver Function Tests:  Recent Labs Lab 03/09/16 1836  03/14/16 0541  AST 32 33  ALT 23 26  ALKPHOS 68 54  BILITOT 0.6 0.5  PROT 6.2* 7.2  ALBUMIN 3.2* 2.4*    Recent Labs Lab 03/09/16 1836  LIPASE 41    Recent Labs Lab 03/10/16 1703  AMMONIA 13   Coagulation Profile: No results for input(s): INR, PROTIME in the last 168 hours. Cardiac Enzymes: No results for input(s): CKTOTAL, CKMB, CKMBINDEX, TROPONINI in the last 168 hours. BNP (last 3 results) No results for input(s): PROBNP in the last 8760 hours.  CBG:  Recent Labs Lab 03/11/16 2155 03/12/16 0802 03/13/16 0734 03/14/16 0754 03/15/16 0753  GLUCAP 87 84 127* 85 129*    Studies: No results found.   Scheduled Meds: . amLODipine  10 mg Oral QHS  . benztropine  1 mg Oral BID  . carvedilol  25 mg Oral BID WC  . cholecalciferol  2,000 Units Oral Daily  . clozapine  250 mg Oral QHS  . cloZAPine  75 mg Oral Daily  . desmopressin  0.4 mg Oral QHS  . folic acid  2 mg Oral Daily  . glycopyrrolate  1 mg Oral TID  . haloperidol  10 mg Oral Daily  . haloperidol  10 mg Oral QHS  . haloperidol  5 mg Oral Q breakfast  . ipratropium  2 spray Each Nare Q12H  . multivitamin with minerals   Oral Daily  . sertraline  100 mg Oral QHS   Continuous Infusions:  PRN Meds: acetaminophen **OR** acetaminophen, labetalol, ondansetron **OR** ondansetron (ZOFRAN) IV, polyethylene glycol  Time spent: 30 minutes  Author: Berle Mull, MD [facility] Pager: [phone] 03/15/2016 4:29 PM  If 7PM-7AM, please contact night-coverage at [email or web address], password [facility]

## 2016-03-15 NOTE — Care Management Important Message (Signed)
Important Message  Patient Details  Name: Mario Ortega MRN: [id number] Date of Birth: December 17, 1965   Medicare Important Message Given:  Yes    Cataleyah Colborn, [name], RN 03/15/2016, 2:59 PM

## 2016-03-16 DIAGNOSIS — T50905A Adverse effect of unspecified drugs, medicaments and biological substances, initial encounter: Secondary | ICD-10-CM | POA: Insufficient documentation

## 2016-03-16 DIAGNOSIS — K2960 Other gastritis without bleeding: Secondary | ICD-10-CM | POA: Insufficient documentation

## 2016-03-16 LAB — GLUCOSE, CAPILLARY: Glucose-Capillary: 105 mg/dL — ABNORMAL HIGH (ref 65–99)

## 2016-03-16 MED ORDER — BENZTROPINE MESYLATE 1 MG PO TABS: 1.0000 mg | ORAL_TABLET | Freq: Two times a day (BID) | ORAL | 0 refills | Status: AC

## 2016-03-16 MED ORDER — HALOPERIDOL 10 MG PO TABS: 5.0000 mg | ORAL_TABLET | Freq: Three times a day (TID) | ORAL | 0 refills | Status: AC

## 2016-03-16 MED ORDER — TUBERCULIN PPD 5 UNIT/0.1ML ID SOLN
5.0000 [IU] | Freq: Once | INTRADERMAL | Status: DC
  Administered 2016-03-16: 5 [IU] via INTRADERMAL
  Filled 2016-03-16: qty 0.1

## 2016-03-16 MED ORDER — IPRATROPIUM BROMIDE 0.03 % NA SOLN: 2.0000 | Freq: Two times a day (BID) | NASAL | 12 refills | Status: AC

## 2016-03-16 MED ORDER — VALPROIC ACID 250 MG PO CPDR: 2.0000 | DELAYED_RELEASE_CAPSULE | Freq: Two times a day (BID) | ORAL | 0 refills | Status: DC

## 2016-03-16 MED ORDER — AMLODIPINE BESYLATE 10 MG PO TABS: 10.0000 mg | ORAL_TABLET | Freq: Every day | ORAL | 0 refills | Status: AC

## 2016-03-16 MED ORDER — FOLIC ACID 1 MG PO TABS: 2.0000 mg | ORAL_TABLET | Freq: Every day | ORAL | 0 refills | Status: AC

## 2016-03-16 MED ORDER — DESMOPRESSIN ACETATE 0.2 MG PO TABS: 0.4000 mg | ORAL_TABLET | Freq: Every day | ORAL | 0 refills | Status: AC

## 2016-03-16 MED ORDER — SERTRALINE HCL 100 MG PO TABS: 100.0000 mg | ORAL_TABLET | Freq: Every day | ORAL | 0 refills | Status: AC

## 2016-03-16 MED ORDER — CLOZAPINE 50 MG PO TABS: 50.0000 mg | ORAL_TABLET | Freq: Two times a day (BID) | ORAL | 1 refills | Status: DC

## 2016-03-16 MED ORDER — GLYCOPYRROLATE 1 MG PO TABS: 1.0000 mg | ORAL_TABLET | Freq: Three times a day (TID) | ORAL | 0 refills | Status: DC

## 2016-03-16 MED ORDER — CARVEDILOL 25 MG PO TABS: 25.0000 mg | ORAL_TABLET | Freq: Two times a day (BID) | ORAL | 0 refills | Status: AC

## 2016-03-16 NOTE — Clinical Social Work Placement (Addendum)
   CLINICAL SOCIAL WORK PLACEMENT  NOTE  Date:  03/16/2016  Patient Details  Name: Mario Ortega MRN: [id number] Date of Birth: 12-Jul-1965  Clinical Social Work is seeking post-discharge placement for this patient at the [place] level of care (*CSW will initial, date and re-position this form in  chart as items are completed):  Yes   Patient/family provided with [facility] Work Department's list of facilities offering this level of care within the geographic area requested by the patient (or if unable, by the patient's family).  Yes   Patient/family informed of their freedom to choose among providers that offer the needed level of care, that participate in Medicare, Medicaid or managed care program needed by the patient, have an available bed and are willing to accept the patient.  Yes   Patient/family informed of [facility]'s ownership interest in [facility] and [facility], as well as of the fact that they are under no obligation to receive care at these facilities.  PASRR submitted to EDS on       PASRR number received on       Existing PASRR number confirmed on 03/16/16     FL2 transmitted to all facilities in geographic area requested by pt/family on 03/16/16     FL2 transmitted to all facilities within larger geographic area on 03/16/16     Patient informed that his/her managed care company has contracts with or will negotiate with certain facilities, including the following:        Yes   Patient/family informed of bed offers received.  Patient chooses bed at [facility]     Physician recommends and patient chooses bed at      Patient to be transferred to [facility] on 03/16/16.  Patient to be transferred to facility by facility     Patient family notified on 03/16/16 of transfer.  Name of family member notified:  attempted to reach mother, Holley Bouche, but voicemail box full     PHYSICIAN       Additional Comment:     _______________________________________________ [name], [place] 03/16/2016, 3:56 PM [phone]

## 2016-03-16 NOTE — Clinical Social Work Note (Signed)
CSW got in touch with pt's population health specialist through [facility] [phone]). She states that she will notify care coordination that pt will require a care coordinator again. CSW also spoke with pt's ACT team, Larene Pickett [phone]). Multiple facilities contacted regarding placement. [facility] assessed pt this afternoon and will accept. Pt agreeable. No changes to d/c summary medications per MD. MD will fax scripts to [facility].   Benay Pike, [place]

## 2016-03-16 NOTE — Discharge Summary (Addendum)
[facility] Discharge Summary   Patient: Mario Ortega [id number]   PCP: Christie Nottingham., PA DOB: 04/30/1966   Date of admission: 03/09/2016   Date of discharge:  03/16/2016    Discharge Diagnoses:  Principal Problem:   Schizoaffective disorder, bipolar type ([place]) Active Problems:   HTN (hypertension)   GERD (gastroesophageal reflux disease)   Schizophrenia ([place])   Thrombocytopenia ([place])   CKD (chronic kidney disease), stage III   Lethargy   Admitted From: group home Disposition:  Group home  Recommendations for Outpatient Follow-up:  1. Follow up with PCP in 1 week to discuss medication management 2. Please obtain BMP/CBCD in one week 3. Make appointment with Hematology/ Oncology for follow up  Follow-up Information    Christie Nottingham., PA Follow up in 1 week(s).   Specialty:  Physician Assistant Why:  Discuss medication management with decreasing clozapine Contact information: [place] [address] [phone]        Molli Hazard, MD Follow up in 1 month(s).   Specialties:  Hematology and Oncology, Oncology Contact information: [address] [place] [address] [phone]        [place]. Schedule an appointment as soon as possible for a visit in 1 week(s).   Contact information: [place] [address] [phone]          Diet recommendation: Regular diet  Activity: The patient is advised to gradually reintroduce usual activities.  Discharge Condition: good  Code Status: full code  History of present illness: Reeves Hesteris a 50 y.o.malewith medical history significant forschizophrenia, chronic kidney disease stage III, hypertension, and macrocytic anemia who presents to the emergency department from his nursing facility for evaluation of low platelets and lethargy. Patient had reportedly been in his usual state until approximately 3 days ago when he was noted to be more lethargic  and not eating. He was evaluated in the emergency department 2 days ago for this, noted to have thrombocytopenia, but was otherwise stable, had no complaints himself, and was returned to his facility. Since his return, his lethargy is increased and he continues to refuse food. He has denied recent fevers, chills, chest pain, palpitations, dyspnea, or cough. He is also denied abdominal pain. Upon arrival to the ED, patient is found to be afebrile, saturating well on room air, hypertensive in the 140/100 range, and vitals otherwise stable. EKG demonstrates a sinus rhythm with incomplete right bundle branch block and noncontrast head CT is negative for acute intracranial abnormality. Chemistry panels notable for a hypernatremia, hyperchloremia, and serum creatinine 1.82, up from 1.48 twoweeks prior. Valproic acid level is elevated to 112. Patient became agitated in the emergency department and was treated with a dose of IV Ativan. CT of the abdomen and pelvis was obtained and there is a decompressed IVC consistent with dehydration and no emergent findings. Patient was given a 1 L normal saline bolus and continued on an aggressive IV hydration with 250 cc/hr. he remained hemodynamically stable in the ED and in no respiratory distress. Platelets continued to decline and patient was started on IVIG. He did slowly become more awake and alert with the taper of Clozapine and the discontinuation of Valproic Acid.  Hospital Course:  Summary of his active problems in the hospital is as following. Lethargy, loss of appetite  Depakote toxicity. Level 118 on admission - Uncertain etiology, mild Depakote toxicity considered given elevated drug level  - Head CT is negative for acute intracranial abnormality  -  Depakote was on hold in the hospital and will resume at 500 mg twice a day. Preadmission dose was 1750 mg in 24 hours. -TSH, B12, both normal - Patient was given IVF hydration - Blood cultures x 2- NGTD - ABG  was mostly within normal limits (slightly low O2) - ammonia level WNL - UA WNL - patient more awake and alert - able to participate in full conversation  Hypernatremia - Na back to normal range  Thrombocytopenia  - Platelet count 32,000 yesterday (improved to 80today)  - IVIG administered at 1g/kg for two days - Clozapine to be slowly tapered and then discontinued - No signs of bleeding - Had been 53k last month, but previously wnl  - Other cell lines remain stable; no evidence for infectious process  - Could be secondary to Depakote or Clozapine - will follow up with Heme/ Onc after discharge  AKI superimposed on CKD stage III  - improved over hospitalization  Macrocytic anemia  - Stable in the hospital. Recommend to repeat within 1 week  Schizophrenia  - Difficult to assess on admission d/t his lethargy  - Has required inpatient treatment on multiple occasions and was incarcerated for murdering his grandfather  - Continue Haldol, Cogentin, Zoloft as patient tolerates - slowly taper Clozaril to discontinue - Depakote held as above and dose lowered as above - TTS consult placed- medication management guidance appreciated - Recommendations from psych are Haldol 10mg  BID and Ativan 2mg  BID PRN agitation - will start Clozapin taper - Psychiatry recommended the patient has capacity to make medical decisions and does not require inpatient psychiatric treatment at present. Outpatient at provider such as Tamela Gammon  Hypertension - Elevated beyond goal at time of admission  - Managed with Norvasc and Coreg at home, will continue   All other chronic medical condition were stable during the hospitalization.  Patient was in by physical therapy who recommended home health and it was arranged by case manager. As well as rolling walker.  On the day of the discharge the patient's vitals were stable, and no other acute medical condition were reported by patient. the patient  was felt safe to be discharge at group home.  Procedures and Results:  None   Consultations:  Psychiatric  DISCHARGE MEDICATION: Due to pt being discharged to a medical facility, all new script for pt's chronic long-term medication were given at the request of the facility.   Current Discharge Medication List    START taking these medications   Details  acetaminophen (TYLENOL) 325 MG tablet Take 2 tablets (650 mg total) by mouth every 6 (six) hours as needed for mild pain (or Fever >/= 101).      CONTINUE these medications which have CHANGED   Details  amLODipine (NORVASC) 10 MG tablet Take 1 tablet (10 mg total) by mouth at bedtime. Qty: 30 tablet, Refills: 0    benztropine (COGENTIN) 1 MG tablet Take 1 tablet (1 mg total) by mouth 2 (two) times daily. Qty: 10 tablet, Refills: 0    carvedilol (COREG) 25 MG tablet Take 1 tablet (25 mg total) by mouth 2 (two) times daily with a meal. Qty: 60 tablet, Refills: 0    clozapine (CLOZARIL) 50 MG tablet Take 1 tablet (50 mg total) by mouth 2 (two) times daily. 1 tablet PO in am and 2.5 tablets PO qPM for four days then decrease to 0.5tab PO qAM and 1.5 tab PO qPM for 4 days and then 1 tab PO qPm for  4 days Qty: 30 tablet, Refills: 1    desmopressin (DDAVP) 0.2 MG tablet Take 2 tablets (0.4 mg total) by mouth at bedtime. Qty: 30 tablet, Refills: 0    folic acid (FOLVITE) 1 MG tablet Take 2 tablets (2 mg total) by mouth daily. Qty: 30 tablet, Refills: 0    glycopyrrolate (ROBINUL) 1 MG tablet Take 1 tablet (1 mg total) by mouth 3 (three) times daily. Qty: 30 tablet, Refills: 0    haloperidol (HALDOL) 10 MG tablet Take 0.5-1 tablets (5-10 mg total) by mouth 3 (three) times daily. Take 5mg  in the morning, take 10mg  by mouth at 2:00pm, and at bedtime. Qty: 30 tablet, Refills: 0    ipratropium (ATROVENT) 0.03 % nasal spray Place 2 sprays into both nostrils every 12 (twelve) hours. Qty: 30 mL, Refills: 12    sertraline (ZOLOFT) 100  MG tablet Take 1 tablet (100 mg total) by mouth at bedtime. Qty: 30 tablet, Refills: 0    Valproic Acid 250 MG CPDR Take 2 capsules (500 mg total) by mouth 2 (two) times daily. Qty: 60 each, Refills: 0      CONTINUE these medications which have NOT CHANGED   Details  Cholecalciferol (VITAMIN D3) 2000 units TABS Take 1 tablet by mouth daily.    Multiple Vitamins-Minerals (THEREMS-M) TABS Take 1 tablet by mouth daily.      STOP taking these medications     divalproex (DEPAKOTE ER) 500 MG 24 hr tablet      divalproex (DEPAKOTE) 250 MG DR tablet        Allergies  Allergen Reactions  . Penicillins Rash    Has patient had a PCN reaction causing immediate rash, facial/tongue/throat swelling, SOB or lightheadedness with hypotension: No Has patient had a PCN reaction causing severe rash involving mucus membranes or skin necrosis: No Has patient had a PCN reaction that required hospitalization No Has patient had a PCN reaction occurring within the last 10 years: No If all of the above answers are "NO", then may proceed with Cephalosporin use.    Discharge Instructions    Call MD for:  difficulty breathing, headache or visual disturbances    Complete by:  As directed    Call MD for:  extreme fatigue    Complete by:  As directed    Call MD for:  persistant dizziness or light-headedness    Complete by:  As directed    Call MD for:  persistant dizziness or light-headedness    Complete by:  As directed    Diet - low sodium heart healthy    Complete by:  As directed    Increase activity slowly    Complete by:  As directed      Discharge Exam: Filed Weights   03/14/16 0445 03/15/16 0500 03/16/16 0555  Weight: 82.6 kg (182 lb 1.6 oz) 78.2 kg (172 lb 8 oz) 78.3 kg (172 lb 9.6 oz)   Vitals:   03/15/16 2149 03/16/16 0555  BP: 115/75 139/86  Pulse: 81 88  Resp: 17 18  Temp: (!) 102.1 F (38.9 C) 99.8 F (37.7 C)   General: Appear in no distress, no Rash; Oral Mucosa  moist. Cardiovascular: S1 and S2 Present, no Murmur, no JVD Respiratory: Bilateral Air entry present and Clear to Auscultation, no Crackles, no wheezes Abdomen: Bowel Sound present, Soft and no tenderness Extremities: no Pedal edema, no calf tenderness Neurology: Grossly no focal neuro deficit.  The results of significant diagnostics from this hospitalization (including imaging, microbiology,  ancillary and laboratory) are listed below for reference.    Significant Diagnostic Studies: Dg Chest 2 View  Result Date: 03/07/2016 CLINICAL DATA:  Weakness EXAM: CHEST  2 VIEW COMPARISON:  None. FINDINGS: The heart size and mediastinal contours are within normal limits. Both lungs are clear. The visualized skeletal structures are unremarkable. Nipple shadows are noted bilaterally. IMPRESSION: No active cardiopulmonary disease. Electronically Signed   By: Inez Catalina M.D.   On: 03/07/2016 16:18   Ct Head Wo Contrast  Result Date: 03/09/2016 CLINICAL DATA:  Altered mental status. Decreased p.o. intake. Low platelets. EXAM: CT HEAD WITHOUT CONTRAST TECHNIQUE: Contiguous axial images were obtained from the base of the skull through the vertex without intravenous contrast. COMPARISON:  None. FINDINGS: Brain: There is mild cerebral atrophy which is advanced for age. There is no evidence of acute cortical infarct, intracranial hemorrhage, mass, midline shift, or extra-axial fluid collection. Vascular: No hyperdense vessel or unexpected calcification. Skull: Normal. Negative for fracture or focal lesion. Sinuses/Orbits: Right maxillary sinus mucous retention cysts. Minimal left maxillary sinus mucosal thickening. Clear mastoid air cells. Unremarkable orbits. Other: None. IMPRESSION: 1. No evidence of acute intracranial abnormality. 2. Premature cerebral atrophy. Electronically Signed   By: Logan Bores M.D.   On: 03/09/2016 21:41   Ct Abdomen Pelvis W Contrast  Result Date: 03/09/2016 CLINICAL DATA:  Acute onset of  loss of appetite. Decreased hippocampal. Initial encounter. EXAM: CT ABDOMEN AND PELVIS WITH CONTRAST TECHNIQUE: Multidetector CT imaging of the abdomen and pelvis was performed using the standard protocol following bolus administration of intravenous contrast. CONTRAST:  168mL ISOVUE-300 IOPAMIDOL (ISOVUE-300) INJECTION 61% COMPARISON:  CT of the abdomen and pelvis from 04/07/2015 FINDINGS: Lower chest: Minimal bibasilar atelectasis is noted. The visualized portions of the mediastinum are unremarkable. Hepatobiliary: The liver is grossly unremarkable in appearance. Stones are noted dependently within the gallbladder. The common bile duct remains normal in caliber. Pancreas: The pancreas is unremarkable in appearance. Spleen: The spleen is within normal limits. Adrenals/Urinary Tract: The adrenal glands are unremarkable in appearance. Small nonobstructing left renal stones are seen, measuring up to 2 mm in size. The kidneys are otherwise unremarkable. There is no evidence of hydronephrosis. No obstructing ureteral stones are identified. No perinephric stranding is seen. Stomach/Bowel: The stomach is unremarkable in appearance. The small bowel is grossly unremarkable. A right lower quadrant colostomy is unremarkable in appearance. The patient's Hartmann's pouch is within normal limits. Vascular/Lymphatic: Minimal calcification is seen along the distal abdominal aorta and its branches. No acute vascular abnormalities are seen. There is partial decompression of the IVC, likely reflecting volume depletion. No retroperitoneal lymphadenopathy is seen. No pelvic sidewall lymphadenopathy is identified. Reproductive: The bladder is significantly distended and grossly unremarkable in appearance. The prostate remains normal in size. Musculoskeletal: No acute osseous abnormalities are identified. The visualized musculature is within normal limits. Vacuum phenomenon is noted at L5-S1. Other: Mild focal soft tissue inflammation  is noted at the left lower back, overlying the left side of the sacrum, raising concern for a mild sacral decubitus ulceration. IMPRESSION: 1. Mild focal soft tissue inflammation at the left lower back, overlying the left side of the sacrum, concerning for a mild sacral decubitus ulceration. Would correlate clinically. 2. Partial decompression of the IVC likely reflects volume depletion (dehydration). 3. Small nonobstructing left renal stones measure up to 2 mm in size. 4. Right lower quadrant colostomy is unremarkable. Hartmann's pouch is within normal limits. 5. Cholelithiasis.  Gallbladder otherwise grossly unremarkable. Electronically Signed  By: Garald Balding M.D.   On: 03/09/2016 21:45    Microbiology: Recent Results (from the past 240 hour(s))  MRSA PCR Screening     Status: Abnormal   Collection Time: 03/10/16  1:15 AM  Result Value Ref Range Status   MRSA by PCR POSITIVE (A) NEGATIVE Final    Comment:        The GeneXpert MRSA Assay (FDA approved for NASAL specimens only), is one component of a comprehensive MRSA colonization surveillance program. It is not intended to diagnose MRSA infection nor to guide or monitor treatment for MRSA infections. RESULT CALLED TO, READ BACK BY AND VERIFIED WITH: ROSE K AT 0333 ON [id number] BY FORSYTH K   Culture, blood (routine x 2)     Status: None   Collection Time: 03/10/16  6:36 PM  Result Value Ref Range Status   Specimen Description BLOOD LEFT ARM  Final   Special Requests BOTTLES DRAWN AEROBIC AND ANAEROBIC [facility] EACH  Final   Culture NO GROWTH 5 DAYS  Final   Report Status 03/15/2016 FINAL  Final  Culture, blood (routine x 2)     Status: None   Collection Time: 03/10/16  6:38 PM  Result Value Ref Range Status   Specimen Description BLOOD LEFT HAND  Final   Special Requests BOTTLES DRAWN AEROBIC AND ANAEROBIC [facility] EACH  Final   Culture NO GROWTH 5 DAYS  Final   Report Status 03/15/2016 FINAL  Final     Labs: CBC:  Recent Labs Lab  03/10/16 0636 03/11/16 0627 03/12/16 0635 03/13/16 0703 03/14/16 0541  WBC 6.2 6.9 5.2 5.1 6.3  NEUTROABS 3.2 3.2 2.7 1.8 2.0  HGB 11.3* 11.4* 10.5* 10.7* 8.8*  HCT 34.8*  33.7* 35.5* 32.3* 33.5* 27.7*  MCV 103.9* 103.8* 102.9* 103.4* 103.7*  PLT 43* 32* 43* 50* 88*   Basic Metabolic Panel:  Recent Labs Lab 03/11/16 1240 03/12/16 0635 03/12/16 1825 03/13/16 0643 03/14/16 0541  NA 149* 146* 145 142 140  K 3.6 3.7 3.5 3.4* 3.4*  CL 120* 119* 115* 114* 112*  CO2 27 27 26 26 27   GLUCOSE 133* 83 138* 93 88  BUN 24* 20 19 18 16   CREATININE 1.41* 1.42* 1.54* 1.40* 1.37*  CALCIUM 9.6 8.9 9.0 8.9 8.7*   Liver Function Tests:  Recent Labs Lab 03/09/16 1836 03/14/16 0541  AST 32 33  ALT 23 26  ALKPHOS 68 54  BILITOT 0.6 0.5  PROT 6.2* 7.2  ALBUMIN 3.2* 2.4*    Recent Labs Lab 03/09/16 1836  LIPASE 41    Recent Labs Lab 03/10/16 1703  AMMONIA 13   Cardiac Enzymes: No results for input(s): CKTOTAL, CKMB, CKMBINDEX, TROPONINI in the last 168 hours. BNP (last 3 results) No results for input(s): BNP in the last 8760 hours. CBG:  Recent Labs Lab 03/12/16 0802 03/13/16 0734 03/14/16 0754 03/15/16 0753 03/16/16 0723  GLUCAP 84 127* 85 129* 105*   Time spent: 30 minutes  Signed:  Lyanne Kates  [facility]  03/16/2016  , 3:48 PM

## 2016-03-16 NOTE — NC FL2 (Signed)
[place] MEDICAID FL2 LEVEL OF CARE SCREENING TOOL     IDENTIFICATION  Patient Name: Mario Ortega Birthdate: [birth year] Sex: male Admission Date (Current Location): 03/09/2016  [place] and [place] Number:  [place] [id number] [place] and Address:  [facility] [address] [place]      Provider Number: [id number]  Attending Physician Name and Address:  Lavina Hamman, MD  Relative Name and Phone Number:       Current Level of Care: Hospital Recommended Level of Care: Other (Comment) (Group home) Prior Approval Number:    Date Approved/Denied:   PASRR Number: [id number]  Discharge Plan: Domiciliary (Rest home)    Current Diagnoses: Patient Active Problem List   Diagnosis Date Noted  . Pill-induced gastritis 03/16/2016  . Upper abdominal pain   . Altered mental status 03/11/2016  . Thrombocytopenia ([place]) 03/09/2016  . AKI (acute kidney injury) ([place]) 03/09/2016  . CKD (chronic kidney disease), stage III 03/09/2016  . Lethargy 03/09/2016  . Dehydration 03/09/2016  . Tobacco use disorder 05/13/2015  . MRSA (methicillin resistant Staphylococcus aureus) 05/13/2015  . Schizophrenia ([place]) 05/12/2015  . Schizoaffective disorder, bipolar type ([place])   . HTN (hypertension) 05/09/2015  . GERD (gastroesophageal reflux disease) 05/09/2015  . GI bleed 04/07/2015    Orientation RESPIRATION BLADDER Height & Weight     Self, Time, Situation, Place  Normal Incontinent Weight: 172 lb 9.6 oz (78.3 kg) Height:  5\' 11"  (180.3 cm)  BEHAVIORAL SYMPTOMS/MOOD NEUROLOGICAL BOWEL NUTRITION STATUS  Other (Comment) (n/a)  (n/a) Colostomy Diet (Soft diet)  AMBULATORY STATUS COMMUNICATION OF NEEDS Skin   Limited Assist Verbally Normal                       Personal Care Assistance Level of Assistance  Bathing, Feeding, Dressing Bathing Assistance: Limited assistance Feeding assistance: Limited assistance Dressing Assistance: Limited assistance    Functional Limitations Info  Sight, Hearing, Speech Sight Info: Adequate Hearing Info: Adequate Speech Info: Adequate    SPECIAL CARE FACTORS FREQUENCY  PT (By licensed PT)     PT Frequency: home health PT              Contractures Contractures Info: Not present    Additional Factors Info  Isolation Precautions Code Status Info: Full code Allergies Info: Penicillins Psychotropic Info: Haldol   Isolation Precautions Info: 03/10/16 mrsa pcr +      Current Medications (03/16/2016):  This is the current hospital active medication list Current Facility-Administered Medications  Medication Dose Route Frequency Provider Last Rate Last Dose  . acetaminophen (TYLENOL) tablet 650 mg  650 mg Oral Q6H PRN Vianne Bulls, MD   650 mg at 03/15/16 2200   Or  . acetaminophen (TYLENOL) suppository 650 mg  650 mg Rectal Q6H PRN Vianne Bulls, MD      . amLODipine (NORVASC) tablet 10 mg  10 mg Oral QHS Vianne Bulls, MD   10 mg at 03/15/16 2201  . benztropine (COGENTIN) tablet 1 mg  1 mg Oral BID Vianne Bulls, MD   1 mg at 03/16/16 1135  . carvedilol (COREG) tablet 25 mg  25 mg Oral BID WC Vianne Bulls, MD   25 mg at 03/16/16 1135  . cholecalciferol (VITAMIN D) tablet 2,000 Units  2,000 Units Oral Daily Vianne Bulls, MD   2,000 Units at 03/16/16 1134  . cloZAPine (CLOZARIL) tablet 250 mg  250 mg Oral QHS Wallis Bamberg, MD  250 mg at 03/15/16 2201  . cloZAPine (CLOZARIL) tablet 75 mg  75 mg Oral Daily Wallis Bamberg, MD   75 mg at 03/16/16 1203  . desmopressin (DDAVP) tablet 0.4 mg  0.4 mg Oral QHS Ilene Qua Opyd, MD   0.4 mg at 03/15/16 2201  . folic acid (FOLVITE) tablet 2 mg  2 mg Oral Daily Vianne Bulls, MD   2 mg at 03/16/16 1134  . glycopyrrolate (ROBINUL) tablet 1 mg  1 mg Oral TID Vianne Bulls, MD   1 mg at 03/16/16 1204  . haloperidol (HALDOL) tablet 10 mg  10 mg Oral Daily Vianne Bulls, MD   10 mg at 03/15/16 1303  . haloperidol (HALDOL) tablet 10 mg  10  mg Oral QHS Ilene Qua Opyd, MD   10 mg at 03/15/16 2201  . haloperidol (HALDOL) tablet 5 mg  5 mg Oral Q breakfast Vianne Bulls, MD   5 mg at 03/16/16 1136  . ipratropium (ATROVENT) 0.06 % nasal spray 2 spray  2 spray Each Nare Q12H Vianne Bulls, MD   2 spray at 03/15/16 1200  . labetalol (NORMODYNE,TRANDATE) injection 10 mg  10 mg Intravenous Q2H PRN Vianne Bulls, MD      . multivitamin with minerals tablet   Oral Daily Ilene Qua Opyd, MD      . ondansetron (ZOFRAN) tablet 4 mg  4 mg Oral Q6H PRN Vianne Bulls, MD       Or  . ondansetron (ZOFRAN) injection 4 mg  4 mg Intravenous Q6H PRN Ilene Qua Opyd, MD      . polyethylene glycol (MIRALAX / GLYCOLAX) packet 17 g  17 g Oral Daily PRN Vianne Bulls, MD      . sertraline (ZOLOFT) tablet 100 mg  100 mg Oral QHS Vianne Bulls, MD   100 mg at 03/15/16 2201     Discharge Medications: DISCHARGE MEDICATION:     Current Discharge Medication List        START taking these medications   Details  acetaminophen (TYLENOL) 325 MG tablet Take 2 tablets (650 mg total) by mouth every 6 (six) hours as needed for mild pain (or Fever >/= 101).          CONTINUE these medications which have CHANGED   Details  amLODipine (NORVASC) 10 MG tablet Take 1 tablet (10 mg total) by mouth at bedtime. Qty: 30 tablet, Refills: 0    benztropine (COGENTIN) 1 MG tablet Take 1 tablet (1 mg total) by mouth 2 (two) times daily. Qty: 10 tablet, Refills: 0    carvedilol (COREG) 25 MG tablet Take 1 tablet (25 mg total) by mouth 2 (two) times daily with a meal. Qty: 60 tablet, Refills: 0    clozapine (CLOZARIL) 50 MG tablet Take 1 tablet (50 mg total) by mouth 2 (two) times daily. 1 tablet PO in am and 2.5 tablets PO qPM for four days then decrease to 0.5tab PO qAM and 1.5 tab PO qPM for 4 days and then 1 tab PO qPm for 4 days Qty: 30 tablet, Refills: 1    desmopressin (DDAVP) 0.2 MG tablet Take 2 tablets (0.4 mg total) by mouth at bedtime. Qty: 30  tablet, Refills: 0    folic acid (FOLVITE) 1 MG tablet Take 2 tablets (2 mg total) by mouth daily. Qty: 30 tablet, Refills: 0    glycopyrrolate (ROBINUL) 1 MG tablet Take 1 tablet (1 mg total) by mouth  3 (three) times daily. Qty: 30 tablet, Refills: 0    haloperidol (HALDOL) 10 MG tablet Take 0.5-1 tablets (5-10 mg total) by mouth 3 (three) times daily. Take 5mg  in the morning, take 10mg  by mouth at 2:00pm, and at bedtime. Qty: 30 tablet, Refills: 0    ipratropium (ATROVENT) 0.03 % nasal spray Place 2 sprays into both nostrils every 12 (twelve) hours. Qty: 30 mL, Refills: 12    sertraline (ZOLOFT) 100 MG tablet Take 1 tablet (100 mg total) by mouth at bedtime. Qty: 30 tablet, Refills: 0    Valproic Acid 250 MG CPDR Take 2 capsules (500 mg total) by mouth 2 (two) times daily. Qty: 60 each, Refills: 0          CONTINUE these medications which have NOT CHANGED   Details  Cholecalciferol (VITAMIN D3) 2000 units TABS Take 1 tablet by mouth daily.    Multiple Vitamins-Minerals (THEREMS-M) TABS Take 1 tablet by mouth daily.         STOP taking these medications     divalproex (DEPAKOTE ER) 500 MG 24 hr tablet      divalproex (DEPAKOTE) 250 MG DR tablet          Relevant Imaging Results:  Relevant Lab Results:   Additional Information    [name], [place]

## 2016-03-17 DIAGNOSIS — E785 Hyperlipidemia, unspecified: Secondary | ICD-10-CM | POA: Diagnosis not present

## 2016-03-17 DIAGNOSIS — N183 Chronic kidney disease, stage 3 (moderate): Secondary | ICD-10-CM | POA: Diagnosis not present

## 2016-03-17 DIAGNOSIS — R1010 Upper abdominal pain, unspecified: Secondary | ICD-10-CM | POA: Diagnosis not present

## 2016-03-17 DIAGNOSIS — E860 Dehydration: Secondary | ICD-10-CM | POA: Diagnosis not present

## 2016-03-17 DIAGNOSIS — Z8679 Personal history of other diseases of the circulatory system: Secondary | ICD-10-CM | POA: Diagnosis not present

## 2016-03-17 DIAGNOSIS — Z933 Colostomy status: Secondary | ICD-10-CM | POA: Diagnosis not present

## 2016-03-17 DIAGNOSIS — F209 Schizophrenia, unspecified: Secondary | ICD-10-CM | POA: Diagnosis not present

## 2016-03-17 DIAGNOSIS — I10 Essential (primary) hypertension: Secondary | ICD-10-CM | POA: Diagnosis not present

## 2016-03-17 DIAGNOSIS — N182 Chronic kidney disease, stage 2 (mild): Secondary | ICD-10-CM | POA: Diagnosis not present

## 2016-03-17 DIAGNOSIS — Z23 Encounter for immunization: Secondary | ICD-10-CM | POA: Diagnosis not present

## 2016-03-19 ENCOUNTER — Encounter (HOSPITAL_COMMUNITY): Payer: Self-pay

## 2016-03-19 ENCOUNTER — Emergency Department (HOSPITAL_COMMUNITY)
Admission: EM | Admit: 2016-03-19 | Discharge: 2016-03-19 | Disposition: A | Discharge status: Home / Self Care | Payer: Medicare Other | Attending: Emergency Medicine | Admitting: Emergency Medicine

## 2016-03-19 DIAGNOSIS — I129 Hypertensive chronic kidney disease with stage 1 through stage 4 chronic kidney disease, or unspecified chronic kidney disease: Secondary | ICD-10-CM | POA: Diagnosis not present

## 2016-03-19 DIAGNOSIS — Z85038 Personal history of other malignant neoplasm of large intestine: Secondary | ICD-10-CM | POA: Diagnosis not present

## 2016-03-19 DIAGNOSIS — Z433 Encounter for attention to colostomy: Secondary | ICD-10-CM

## 2016-03-19 DIAGNOSIS — Z933 Colostomy status: Secondary | ICD-10-CM | POA: Diagnosis not present

## 2016-03-19 DIAGNOSIS — N183 Chronic kidney disease, stage 3 (moderate): Secondary | ICD-10-CM | POA: Insufficient documentation

## 2016-03-19 DIAGNOSIS — R531 Weakness: Secondary | ICD-10-CM | POA: Diagnosis not present

## 2016-03-19 DIAGNOSIS — O0889 Other complications following an ectopic and molar pregnancy: Secondary | ICD-10-CM | POA: Diagnosis not present

## 2016-03-19 DIAGNOSIS — K9409 Other complications of colostomy: Secondary | ICD-10-CM | POA: Diagnosis not present

## 2016-03-19 DIAGNOSIS — F17200 Nicotine dependence, unspecified, uncomplicated: Secondary | ICD-10-CM | POA: Diagnosis not present

## 2016-03-19 DIAGNOSIS — N189 Chronic kidney disease, unspecified: Secondary | ICD-10-CM | POA: Diagnosis not present

## 2016-03-19 DIAGNOSIS — T859XXA Unspecified complication of internal prosthetic device, implant and graft, initial encounter: Secondary | ICD-10-CM | POA: Diagnosis not present

## 2016-03-19 DIAGNOSIS — Z4689 Encounter for fitting and adjustment of other specified devices: Secondary | ICD-10-CM | POA: Diagnosis not present

## 2016-03-19 HISTORY — DX: Malignant (primary) neoplasm, unspecified: C80.1

## 2016-03-19 NOTE — ED Provider Notes (Signed)
[facility] DEPT Provider Note   CSN: [id number] Arrival date & time: 03/19/16  0756     History   Chief Complaint Chief Complaint  Patient presents with  . needs colostomy bag    HPI Mario Ortega is a 50 y.o. male with a past medical history as outlined below presenting for assistance with his colostomy.  He is in a new group home and his medical supplies have not been completely set up and he used his last colostomy bag, removing it this morning.  His supplies are supposed to arrive tomorrow.  In the interim he is asking for a bag to help him and tell then.  He has no complaints or medical concerns at this time.  The history is provided by the patient.    Past Medical History:  Diagnosis Date  . Anemia    "Nacrocytic"  . Cancer [facility])    colon cancer  . Colostomy in place [facility])   . Drug abuse   . Edentulous   . GERD (gastroesophageal reflux disease)   . Hypertension   . MRSA (methicillin resistant staph aureus) culture positive   . Schizo affective schizophrenia [facility])     Patient Active Problem List   Diagnosis Date Noted  . Pill-induced gastritis 03/16/2016  . Upper abdominal pain   . Altered mental status 03/11/2016  . Thrombocytopenia ([place]) 03/09/2016  . AKI (acute kidney injury) ([place]) 03/09/2016  . CKD (chronic kidney disease), stage III 03/09/2016  . Lethargy 03/09/2016  . Dehydration 03/09/2016  . Tobacco use disorder 05/13/2015  . MRSA (methicillin resistant Staphylococcus aureus) 05/13/2015  . Schizophrenia ([place]) 05/12/2015  . Schizoaffective disorder, bipolar type ([place])   . HTN (hypertension) 05/09/2015  . GERD (gastroesophageal reflux disease) 05/09/2015  . GI bleed 04/07/2015    Past Surgical History:  Procedure Laterality Date  . arm surgery    . COLOSTOMY         Home Medications    Prior to Admission medications   Medication Sig Start Date End Date Taking? Authorizing Provider  acetaminophen (TYLENOL) 325 MG tablet Take 2  tablets (650 mg total) by mouth every 6 (six) hours as needed for mild pain (or Fever >/= 101). 03/14/16   Wallis Bamberg, MD  amLODipine (NORVASC) 10 MG tablet Take 1 tablet (10 mg total) by mouth at bedtime. 03/16/16   Lavina Hamman, MD  benztropine (COGENTIN) 1 MG tablet Take 1 tablet (1 mg total) by mouth 2 (two) times daily. 03/16/16   Lavina Hamman, MD  carvedilol (COREG) 25 MG tablet Take 1 tablet (25 mg total) by mouth 2 (two) times daily with a meal. 03/16/16   Lavina Hamman, MD  Cholecalciferol (VITAMIN D3) 2000 units TABS Take 1 tablet by mouth daily.    Historical Provider, MD  clozapine (CLOZARIL) 50 MG tablet Take 1 tablet (50 mg total) by mouth 2 (two) times daily. 1 tablet PO in am and 2.5 tablets PO qPM for four days then decrease to 0.5tab PO qAM and 1.5 tab PO qPM for 4 days and then 1 tab PO qPm for 4 days 03/16/16   Lavina Hamman, MD  desmopressin (DDAVP) 0.2 MG tablet Take 2 tablets (0.4 mg total) by mouth at bedtime. 03/16/16   Lavina Hamman, MD  folic acid (FOLVITE) 1 MG tablet Take 2 tablets (2 mg total) by mouth daily. 03/16/16   Lavina Hamman, MD  glycopyrrolate (ROBINUL) 1 MG tablet Take 1 tablet (1 mg total)  by mouth 3 (three) times daily. 03/16/16   Lavina Hamman, MD  haloperidol (HALDOL) 10 MG tablet Take 0.5-1 tablets (5-10 mg total) by mouth 3 (three) times daily. Take 5mg  in the morning, take 10mg  by mouth at 2:00pm, and at bedtime. 03/16/16   Lavina Hamman, MD  ipratropium (ATROVENT) 0.03 % nasal spray Place 2 sprays into both nostrils every 12 (twelve) hours. 03/16/16   Lavina Hamman, MD  Multiple Vitamins-Minerals (THEREMS-M) TABS Take 1 tablet by mouth daily.    Historical Provider, MD  sertraline (ZOLOFT) 100 MG tablet Take 1 tablet (100 mg total) by mouth at bedtime. 03/16/16   Lavina Hamman, MD  Valproic Acid 250 MG CPDR Take 2 capsules (500 mg total) by mouth 2 (two) times daily. 03/16/16   Lavina Hamman, MD    Family History No family history on  file.  Social History Social History  Substance Use Topics  . Smoking status: Current Every Day Smoker  . Smokeless tobacco: Never Used  . Alcohol use No     Allergies   Penicillins   Review of Systems Review of Systems  Constitutional: Negative for chills and fever.  HENT: Negative.   Eyes: Negative.   Respiratory: Negative.   Cardiovascular: Negative.   Gastrointestinal: Negative for abdominal pain, nausea and vomiting.  Genitourinary: Negative.   Musculoskeletal: Negative for arthralgias, joint swelling and neck pain.  Skin: Negative.  Negative for rash and wound.  Neurological: Negative.   Psychiatric/Behavioral: Negative.      Physical Exam Updated Vital Signs BP 136/88 (BP Location: Left Arm)   Pulse 65   Temp 98.4 F (36.9 C) (Oral)   Resp 16   Ht 5\' 11"  (1.803 m)   Wt 83.6 kg   SpO2 100%   BMI 25.70 kg/m   Physical Exam  Constitutional: He appears well-developed and well-nourished.  HENT:  Head: Normocephalic and atraumatic.  Eyes: Conjunctivae are normal.  Neck: Normal range of motion.  Cardiovascular: Normal rate.   Pulmonary/Chest: Effort normal. He has no wheezes.  Abdominal: Soft. Bowel sounds are normal. There is no tenderness.  Healthy pink colostomy stoma right lower abdomen,  No surrounding erythema or skin breakdown.  Musculoskeletal: Normal range of motion.  Neurological: He is alert.  Skin: Skin is warm and dry.  Psychiatric: He has a normal mood and affect.  Nursing note and vitals reviewed.    ED Treatments / Results  Labs (all labs ordered are listed, but only abnormal results are displayed) Labs Reviewed - No data to display  EKG  EKG Interpretation None       Radiology No results found.  Procedures Procedures (including critical care time)  Medications Ordered in ED Medications - No data to display   Initial Impression / Assessment and Plan / ED Course  I have reviewed the triage vital signs and the nursing  notes.  Pertinent labs & imaging results that were available during my care of the patient were reviewed by me and considered in my medical decision making (see chart for details).  Clinical Course    Pt provided with new colostomy bag.  Prn f/u anticipated.  Final Clinical Impressions(s) / ED Diagnoses   Final diagnoses:  Colostomy care [facility])    New Prescriptions New Prescriptions   No medications on file     Evalee Jefferson, PA-C 03/19/16 [place], DO 03/22/16 1706

## 2016-03-19 NOTE — ED Triage Notes (Signed)
Pt resident of [facility] and reports he pulled his colostomy bag off and doesn't have any supplies.  Reports is new to this group home and a nurse is supposed to deliver colostomy supplies tomorrow.

## 2016-03-21 DIAGNOSIS — R531 Weakness: Secondary | ICD-10-CM | POA: Diagnosis not present

## 2016-03-21 DIAGNOSIS — N189 Chronic kidney disease, unspecified: Secondary | ICD-10-CM | POA: Diagnosis not present

## 2016-03-21 DIAGNOSIS — Z933 Colostomy status: Secondary | ICD-10-CM | POA: Diagnosis not present

## 2016-03-21 DIAGNOSIS — I129 Hypertensive chronic kidney disease with stage 1 through stage 4 chronic kidney disease, or unspecified chronic kidney disease: Secondary | ICD-10-CM | POA: Diagnosis not present

## 2016-03-22 DIAGNOSIS — Z111 Encounter for screening for respiratory tuberculosis: Secondary | ICD-10-CM | POA: Diagnosis not present

## 2016-03-22 DIAGNOSIS — F209 Schizophrenia, unspecified: Secondary | ICD-10-CM | POA: Diagnosis not present

## 2016-03-22 DIAGNOSIS — N183 Chronic kidney disease, stage 3 (moderate): Secondary | ICD-10-CM | POA: Diagnosis not present

## 2016-03-22 DIAGNOSIS — Z933 Colostomy status: Secondary | ICD-10-CM | POA: Diagnosis not present

## 2016-03-23 DIAGNOSIS — R531 Weakness: Secondary | ICD-10-CM | POA: Diagnosis not present

## 2016-03-23 DIAGNOSIS — N189 Chronic kidney disease, unspecified: Secondary | ICD-10-CM | POA: Diagnosis not present

## 2016-03-23 DIAGNOSIS — Z933 Colostomy status: Secondary | ICD-10-CM | POA: Diagnosis not present

## 2016-03-23 DIAGNOSIS — I129 Hypertensive chronic kidney disease with stage 1 through stage 4 chronic kidney disease, or unspecified chronic kidney disease: Secondary | ICD-10-CM | POA: Diagnosis not present

## 2016-03-28 DIAGNOSIS — I129 Hypertensive chronic kidney disease with stage 1 through stage 4 chronic kidney disease, or unspecified chronic kidney disease: Secondary | ICD-10-CM | POA: Diagnosis not present

## 2016-03-28 DIAGNOSIS — R531 Weakness: Secondary | ICD-10-CM | POA: Diagnosis not present

## 2016-03-28 DIAGNOSIS — Z933 Colostomy status: Secondary | ICD-10-CM | POA: Diagnosis not present

## 2016-03-28 DIAGNOSIS — N189 Chronic kidney disease, unspecified: Secondary | ICD-10-CM | POA: Diagnosis not present

## 2016-03-30 DIAGNOSIS — R531 Weakness: Secondary | ICD-10-CM | POA: Diagnosis not present

## 2016-03-30 DIAGNOSIS — N189 Chronic kidney disease, unspecified: Secondary | ICD-10-CM | POA: Diagnosis not present

## 2016-03-30 DIAGNOSIS — Z933 Colostomy status: Secondary | ICD-10-CM | POA: Diagnosis not present

## 2016-03-30 DIAGNOSIS — I129 Hypertensive chronic kidney disease with stage 1 through stage 4 chronic kidney disease, or unspecified chronic kidney disease: Secondary | ICD-10-CM | POA: Diagnosis not present

## 2016-04-03 DIAGNOSIS — I120 Hypertensive chronic kidney disease with stage 5 chronic kidney disease or end stage renal disease: Secondary | ICD-10-CM | POA: Diagnosis not present

## 2016-04-04 DIAGNOSIS — I129 Hypertensive chronic kidney disease with stage 1 through stage 4 chronic kidney disease, or unspecified chronic kidney disease: Secondary | ICD-10-CM | POA: Diagnosis not present

## 2016-04-04 DIAGNOSIS — R531 Weakness: Secondary | ICD-10-CM | POA: Diagnosis not present

## 2016-04-04 DIAGNOSIS — Z933 Colostomy status: Secondary | ICD-10-CM | POA: Diagnosis not present

## 2016-04-04 DIAGNOSIS — Z79899 Other long term (current) drug therapy: Secondary | ICD-10-CM | POA: Diagnosis not present

## 2016-04-04 DIAGNOSIS — N189 Chronic kidney disease, unspecified: Secondary | ICD-10-CM | POA: Diagnosis not present

## 2016-04-07 DIAGNOSIS — I129 Hypertensive chronic kidney disease with stage 1 through stage 4 chronic kidney disease, or unspecified chronic kidney disease: Secondary | ICD-10-CM | POA: Diagnosis not present

## 2016-04-07 DIAGNOSIS — R531 Weakness: Secondary | ICD-10-CM | POA: Diagnosis not present

## 2016-04-07 DIAGNOSIS — N189 Chronic kidney disease, unspecified: Secondary | ICD-10-CM | POA: Diagnosis not present

## 2016-04-07 DIAGNOSIS — Z933 Colostomy status: Secondary | ICD-10-CM | POA: Diagnosis not present

## 2016-04-12 DIAGNOSIS — F209 Schizophrenia, unspecified: Secondary | ICD-10-CM | POA: Diagnosis not present

## 2016-04-12 DIAGNOSIS — I10 Essential (primary) hypertension: Secondary | ICD-10-CM | POA: Diagnosis not present

## 2016-04-12 DIAGNOSIS — E860 Dehydration: Secondary | ICD-10-CM | POA: Diagnosis not present

## 2016-04-12 DIAGNOSIS — R1010 Upper abdominal pain, unspecified: Secondary | ICD-10-CM | POA: Diagnosis not present

## 2016-04-12 DIAGNOSIS — D649 Anemia, unspecified: Secondary | ICD-10-CM | POA: Diagnosis not present

## 2016-04-12 DIAGNOSIS — Z933 Colostomy status: Secondary | ICD-10-CM | POA: Diagnosis not present

## 2016-04-12 DIAGNOSIS — R531 Weakness: Secondary | ICD-10-CM | POA: Diagnosis not present

## 2016-04-12 DIAGNOSIS — N182 Chronic kidney disease, stage 2 (mild): Secondary | ICD-10-CM | POA: Diagnosis not present

## 2016-04-12 DIAGNOSIS — N189 Chronic kidney disease, unspecified: Secondary | ICD-10-CM | POA: Diagnosis not present

## 2016-04-12 DIAGNOSIS — E785 Hyperlipidemia, unspecified: Secondary | ICD-10-CM | POA: Diagnosis not present

## 2016-04-12 DIAGNOSIS — I129 Hypertensive chronic kidney disease with stage 1 through stage 4 chronic kidney disease, or unspecified chronic kidney disease: Secondary | ICD-10-CM | POA: Diagnosis not present

## 2016-04-13 DIAGNOSIS — R531 Weakness: Secondary | ICD-10-CM | POA: Diagnosis not present

## 2016-04-13 DIAGNOSIS — I129 Hypertensive chronic kidney disease with stage 1 through stage 4 chronic kidney disease, or unspecified chronic kidney disease: Secondary | ICD-10-CM | POA: Diagnosis not present

## 2016-04-13 DIAGNOSIS — Z933 Colostomy status: Secondary | ICD-10-CM | POA: Diagnosis not present

## 2016-04-13 DIAGNOSIS — N189 Chronic kidney disease, unspecified: Secondary | ICD-10-CM | POA: Diagnosis not present

## 2016-04-14 DIAGNOSIS — N183 Chronic kidney disease, stage 3 (moderate): Secondary | ICD-10-CM | POA: Diagnosis not present

## 2016-04-18 DIAGNOSIS — I129 Hypertensive chronic kidney disease with stage 1 through stage 4 chronic kidney disease, or unspecified chronic kidney disease: Secondary | ICD-10-CM | POA: Diagnosis not present

## 2016-04-18 DIAGNOSIS — Z933 Colostomy status: Secondary | ICD-10-CM | POA: Diagnosis not present

## 2016-04-18 DIAGNOSIS — N189 Chronic kidney disease, unspecified: Secondary | ICD-10-CM | POA: Diagnosis not present

## 2016-04-18 DIAGNOSIS — R531 Weakness: Secondary | ICD-10-CM | POA: Diagnosis not present

## 2016-04-19 DIAGNOSIS — Z933 Colostomy status: Secondary | ICD-10-CM | POA: Diagnosis not present

## 2016-04-19 DIAGNOSIS — I129 Hypertensive chronic kidney disease with stage 1 through stage 4 chronic kidney disease, or unspecified chronic kidney disease: Secondary | ICD-10-CM | POA: Diagnosis not present

## 2016-04-19 DIAGNOSIS — N189 Chronic kidney disease, unspecified: Secondary | ICD-10-CM | POA: Diagnosis not present

## 2016-04-19 DIAGNOSIS — R531 Weakness: Secondary | ICD-10-CM | POA: Diagnosis not present

## 2016-04-25 DIAGNOSIS — N183 Chronic kidney disease, stage 3 (moderate): Secondary | ICD-10-CM | POA: Diagnosis not present

## 2016-04-25 DIAGNOSIS — L0390 Cellulitis, unspecified: Secondary | ICD-10-CM | POA: Diagnosis not present

## 2016-04-25 DIAGNOSIS — Z933 Colostomy status: Secondary | ICD-10-CM | POA: Diagnosis not present

## 2016-04-26 ENCOUNTER — Emergency Department (HOSPITAL_COMMUNITY)
Admission: EM | Admit: 2016-04-26 | Discharge: 2016-04-26 | Disposition: A | Discharge status: Home / Self Care | Payer: Medicare Other | Attending: Emergency Medicine | Admitting: Emergency Medicine

## 2016-04-26 ENCOUNTER — Encounter (HOSPITAL_COMMUNITY): Payer: Self-pay | Admitting: Emergency Medicine

## 2016-04-26 DIAGNOSIS — Z79899 Other long term (current) drug therapy: Secondary | ICD-10-CM | POA: Diagnosis not present

## 2016-04-26 DIAGNOSIS — N183 Chronic kidney disease, stage 3 (moderate): Secondary | ICD-10-CM | POA: Diagnosis not present

## 2016-04-26 DIAGNOSIS — R1031 Right lower quadrant pain: Secondary | ICD-10-CM | POA: Diagnosis present

## 2016-04-26 DIAGNOSIS — Z85038 Personal history of other malignant neoplasm of large intestine: Secondary | ICD-10-CM | POA: Diagnosis not present

## 2016-04-26 DIAGNOSIS — Z433 Encounter for attention to colostomy: Secondary | ICD-10-CM

## 2016-04-26 DIAGNOSIS — K9401 Colostomy hemorrhage: Secondary | ICD-10-CM | POA: Diagnosis not present

## 2016-04-26 DIAGNOSIS — I129 Hypertensive chronic kidney disease with stage 1 through stage 4 chronic kidney disease, or unspecified chronic kidney disease: Secondary | ICD-10-CM | POA: Insufficient documentation

## 2016-04-26 DIAGNOSIS — F17200 Nicotine dependence, unspecified, uncomplicated: Secondary | ICD-10-CM | POA: Insufficient documentation

## 2016-04-26 LAB — COMPREHENSIVE METABOLIC PANEL
ALT: 24 U/L (ref 17–63)
AST: 45 U/L — ABNORMAL HIGH (ref 15–41)
Albumin: 3.6 g/dL (ref 3.5–5.0)
Alkaline Phosphatase: 79 U/L (ref 38–126)
Anion gap: 6 (ref 5–15)
BUN: 12 mg/dL (ref 6–20)
CO2: 24 mmol/L (ref 22–32)
Calcium: 9.9 mg/dL (ref 8.9–10.3)
Chloride: 108 mmol/L (ref 101–111)
Creatinine, Ser: 1.32 mg/dL — ABNORMAL HIGH (ref 0.61–1.24)
GFR calc Af Amer: >60 mL/min (ref >60)
GFR calc non Af Amer: >60 mL/min (ref >60)
Glucose, Bld: 97 mg/dL (ref 65–99)
Potassium: 3.3 mmol/L — ABNORMAL LOW (ref 3.5–5.1)
Sodium: 138 mmol/L (ref 135–145)
Total Bilirubin: 0.4 mg/dL (ref 0.3–1.2)
Total Protein: 7.4 g/dL (ref 6.5–8.1)

## 2016-04-26 LAB — CBC WITH DIFFERENTIAL/PLATELET
Basophils Absolute: 0 10*3/uL (ref 0.0–0.1)
Basophils Relative: 0 %
Eosinophils Absolute: 0.2 10*3/uL (ref 0.0–0.7)
Eosinophils Relative: 2 %
HCT: 29.4 % — ABNORMAL LOW (ref 39.0–52.0)
Hemoglobin: 9.6 g/dL — ABNORMAL LOW (ref 13.0–17.0)
Lymphocytes Relative: 33 %
Lymphs Abs: 2.8 10*3/uL (ref 0.7–4.0)
MCH: 32.9 pg (ref 26.0–34.0)
MCHC: 32.7 g/dL (ref 30.0–36.0)
MCV: 100.7 fL — ABNORMAL HIGH (ref 78.0–100.0)
Monocytes Absolute: 0.6 10*3/uL (ref 0.1–1.0)
Monocytes Relative: 7 %
Neutro Abs: 5 10*3/uL (ref 1.7–7.7)
Neutrophils Relative %: 58 %
Platelets: 289 10*3/uL (ref 150–400)
RBC: 2.92 MIL/uL — ABNORMAL LOW (ref 4.22–5.81)
RDW: 17.1 % — ABNORMAL HIGH (ref 11.5–15.5)
WBC: 8.5 10*3/uL (ref 4.0–10.5)

## 2016-04-26 LAB — URINE MICROSCOPIC-ADD ON
RBC / HPF: NONE SEEN RBC/hpf (ref 0–5)
Squamous Epithelial / LPF: NONE SEEN

## 2016-04-26 LAB — URINALYSIS, ROUTINE W REFLEX MICROSCOPIC
Bilirubin Urine: NEGATIVE
Glucose, UA: NEGATIVE mg/dL
Hgb urine dipstick: NEGATIVE
Ketones, ur: NEGATIVE mg/dL
Nitrite: NEGATIVE
Protein, ur: NEGATIVE mg/dL
Specific Gravity, Urine: <1.005 — ABNORMAL LOW (ref 1.005–1.030)
pH: 5.5 (ref 5.0–8.0)

## 2016-04-26 LAB — LIPASE, BLOOD: Lipase: 24 U/L (ref 11–51)

## 2016-04-26 MED ORDER — CEPHALEXIN 500 MG PO CAPS: 500.0000 mg | ORAL_CAPSULE | Freq: Two times a day (BID) | ORAL | 0 refills | Status: DC

## 2016-04-26 NOTE — Discharge Instructions (Signed)
Continue doxycycline at home for wound. Have agency send out wound care nurse for further evaluation and care.  You urine could potentially suggest UTI. We prescribed you a course of keflex for this.  Return for worsening symptoms, including fever, confusion, intractable vomiting or escalating pain or any other symptoms concerning to you.

## 2016-04-26 NOTE — ED Notes (Signed)
Materials  Called for ostomy supplies

## 2016-04-26 NOTE — ED Provider Notes (Signed)
[facility] DEPT Provider Note   CSN: [id number] Arrival date & time: 04/26/16  0907  By signing my name below, I, Mario Ortega, attest that this documentation has been prepared under the direction and in the presence of Mario Dandy, MD . Electronically Signed: Higinio Ortega, Scribe. 04/26/2016. 9:42 AM.  History   Chief Complaint Chief Complaint  Patient presents with  . Abdominal Pain   The history is provided by the patient and a caregiver. No language interpreter was used.   HPI Comments: Mario Ortega is a 50 y.o. male with PMHx of colon cancer, who presents to the Emergency Department for an evaluation of gradual onset, pain/burning surrounding his colostomy bag in his RLQ. Per caretaker, pt had a colostomy placed 6 months ago and has noticed some intermittent redness and "oozing blood" around the stoma recently. He notes associated increased urinary output recently in which pt will often "wet the bed." Pt denies vomiting, fever, decreased appetite, abdominal pain, dysuria and abnormal consistency of stool.  Past Medical History:  Diagnosis Date  . Anemia    "Nacrocytic"  . Cancer [facility])    colon cancer  . Colostomy in place [facility])   . Drug abuse   . Edentulous   . GERD (gastroesophageal reflux disease)   . Hypertension   . MRSA (methicillin resistant staph aureus) culture positive   . Schizo affective schizophrenia [facility])     Patient Active Problem List   Diagnosis Date Noted  . Pill-induced gastritis 03/16/2016  . Upper abdominal pain   . Altered mental status 03/11/2016  . Thrombocytopenia ([place]) 03/09/2016  . AKI (acute kidney injury) ([place]) 03/09/2016  . CKD (chronic kidney disease), stage III 03/09/2016  . Lethargy 03/09/2016  . Dehydration 03/09/2016  . Tobacco use disorder 05/13/2015  . MRSA (methicillin resistant Staphylococcus aureus) 05/13/2015  . Schizophrenia ([place]) 05/12/2015  . Schizoaffective disorder, bipolar type ([place])   . HTN (hypertension)  05/09/2015  . GERD (gastroesophageal reflux disease) 05/09/2015  . GI bleed 04/07/2015    Past Surgical History:  Procedure Laterality Date  . arm surgery    . COLOSTOMY      Home Medications    Prior to Admission medications   Medication Sig Start Date End Date Taking? Authorizing Provider  acetaminophen (TYLENOL) 325 MG tablet Take 2 tablets (650 mg total) by mouth every 6 (six) hours as needed for mild pain (or Fever >/= 101). 03/14/16   Wallis Bamberg, MD  amLODipine (NORVASC) 10 MG tablet Take 1 tablet (10 mg total) by mouth at bedtime. 03/16/16   Lavina Hamman, MD  benztropine (COGENTIN) 1 MG tablet Take 1 tablet (1 mg total) by mouth 2 (two) times daily. 03/16/16   Lavina Hamman, MD  carvedilol (COREG) 25 MG tablet Take 1 tablet (25 mg total) by mouth 2 (two) times daily with a meal. 03/16/16   Lavina Hamman, MD  cephALEXin (KEFLEX) 500 MG capsule Take 1 capsule (500 mg total) by mouth 2 (two) times daily. 04/26/16   Mario Dandy, MD  Cholecalciferol (VITAMIN D3) 2000 units TABS Take 1 tablet by mouth daily.    Historical Provider, MD  clozapine (CLOZARIL) 50 MG tablet Take 1 tablet (50 mg total) by mouth 2 (two) times daily. 1 tablet PO in am and 2.5 tablets PO qPM for four days then decrease to 0.5tab PO qAM and 1.5 tab PO qPM for 4 days and then 1 tab PO qPm for 4 days 03/16/16   Diamantina [facility]  Jerilynn Som, MD  desmopressin (DDAVP) 0.2 MG tablet Take 2 tablets (0.4 mg total) by mouth at bedtime. 03/16/16   Lavina Hamman, MD  folic acid (FOLVITE) 1 MG tablet Take 2 tablets (2 mg total) by mouth daily. 03/16/16   Lavina Hamman, MD  glycopyrrolate (ROBINUL) 1 MG tablet Take 1 tablet (1 mg total) by mouth 3 (three) times daily. 03/16/16   Lavina Hamman, MD  haloperidol (HALDOL) 10 MG tablet Take 0.5-1 tablets (5-10 mg total) by mouth 3 (three) times daily. Take 5mg  in the morning, take 10mg  by mouth at 2:00pm, and at bedtime. 03/16/16   Lavina Hamman, MD  ipratropium (ATROVENT) 0.03 % nasal  spray Place 2 sprays into both nostrils every 12 (twelve) hours. 03/16/16   Lavina Hamman, MD  Multiple Vitamins-Minerals (THEREMS-M) TABS Take 1 tablet by mouth daily.    Historical Provider, MD  sertraline (ZOLOFT) 100 MG tablet Take 1 tablet (100 mg total) by mouth at bedtime. 03/16/16   Lavina Hamman, MD  Valproic Acid 250 MG CPDR Take 2 capsules (500 mg total) by mouth 2 (two) times daily. 03/16/16   Lavina Hamman, MD    Family History History reviewed. No pertinent family history.  Social History Social History  Substance Use Topics  . Smoking status: Current Every Day Smoker  . Smokeless tobacco: Never Used  . Alcohol use No     Allergies   Penicillins  Review of Systems Review of Systems  Constitutional: Negative for appetite change and fever.  Gastrointestinal: Positive for abdominal pain. Negative for vomiting.       Pain surrounding colostomy bag Negative change in consistency of stool   Genitourinary: Negative for dysuria.  All other systems reviewed and are negative.  Physical Exam Updated Vital Signs BP (!) 153/103 (BP Location: Left Arm)   Pulse 88   Temp 97.9 F (36.6 C) (Oral)   Resp 18   Ht 5\' 11"  (1.803 m)   Wt 166 lb (75.3 kg)   SpO2 100%   BMI 23.15 kg/m   Physical Exam Physical Exam  Nursing note and vitals reviewed. Constitutional: Well developed, well nourished, non-toxic, and in no acute distress Head: Normocephalic and atraumatic.  Mouth/Throat: Oropharynx is clear and moist.  Neck: Normal range of motion. Neck supple.  Cardiovascular: Normal rate and regular rhythm.   Pulmonary/Chest: Effort normal and breath sounds normal.  Abdominal: Soft. There is no rebound and no guarding. Colostomy in RLQ draining loose non-bloody stool, no abdominal distention or tenderness.  Musculoskeletal: Normal range of motion.  Neurological: Alert, no facial droop, fluent speech, moves all extremities symmetrically Skin: Skin is warm and dry.    Psychiatric: Cooperative  ED Treatments / Results  Labs (all labs ordered are listed, but only abnormal results are displayed) Labs Reviewed  CBC WITH DIFFERENTIAL/PLATELET - Abnormal; Notable for the following:       Result Value   RBC 2.92 (*)    Hemoglobin 9.6 (*)    HCT 29.4 (*)    MCV 100.7 (*)    RDW 17.1 (*)    All other components within normal limits  COMPREHENSIVE METABOLIC PANEL - Abnormal; Notable for the following:    Potassium 3.3 (*)    Creatinine, Ser 1.32 (*)    AST 45 (*)    All other components within normal limits  URINALYSIS, ROUTINE W REFLEX MICROSCOPIC (NOT AT [facility]) - Abnormal; Notable for the following:    Specific Gravity, Urine <  1.005 (*)    Leukocytes, UA SMALL (*)    All other components within normal limits  URINE MICROSCOPIC-ADD ON - Abnormal; Notable for the following:    Bacteria, UA FEW (*)    All other components within normal limits  URINE CULTURE  LIPASE, BLOOD    EKG  EKG Interpretation None       Radiology No results found.  Procedures Procedures (including critical care time)  Medications Ordered in ED Medications - No data to display  DIAGNOSTIC STUDIES:  Oxygen Saturation is 100% on RA, normal by my interpretation.    COORDINATION OF CARE:  9:30 AM Discussed treatment Ortega with pt at bedside and pt agreed to Ortega.  Initial Impression / Assessment and Ortega / ED Course  I have reviewed the triage vital signs and the nursing notes.  Pertinent labs & imaging results that were available during my care of the patient were reviewed by me and considered in my medical decision making (see chart for details).  Clinical Course    22 -year-old male with history of schizoaffective disorder andreported partial colectomy for colon cancer with colostomy who presents with pain and bruising around his stoma. His vital signs are non-concerning. He is well-appearing in no acute distress. Colostomy in the right lower quadrant.  Abdomen is soft, nontender. After removing his colostomy appliance, there is a ring of erythema, skin irritation around the stoma with the appliance was sitting on top of. According to his caregiver, he was seen by Dr. Radford Pax his PCP yesterday and given a course of doxycycline for treatment of potential cellulitis. This area seems more consistent with likely irritation from moisture of overlying stool. His caregiver also states that he will tuck his colostomy inappropriately in his pants and it will sometime burst, with stool irritating the skin around the ostomy.   His basic blood work overall is very reassuring. No concern for acute intra-abdominal process at this time such as obstruction or intra-abdominal infection. Patient with home health agency, and I discussed with her home nurse about potential wound care management for home. UA is suggestive of potential UTI and he is also given a course of Keflex. At this time I feel that he is stable for discharge home. Strict return and follow-up instructions reviewed. He and his caregiver expressed understanding of all discharge instructions and felt comfortable with the Ortega of care.   I personally performed the services described in this documentation, which was scribed in my presence. The recorded information has been reviewed and is accurate.   Final Clinical Impressions(s) / ED Diagnoses   Final diagnoses:  Colostomy care [facility])  Bleeding from colostomy [facility])    New Prescriptions Discharge Medication List as of 04/26/2016 11:09 AM       Mario Dandy, MD 04/26/16 [phone]

## 2016-04-26 NOTE — ED Triage Notes (Signed)
Pt has had ostomy over 6 months. Caretaker states noticed ostomy with redness arouind it and pt c/o pain at times. Denies consistency/color changes. Green loose stool in bag now.

## 2016-04-27 DIAGNOSIS — R531 Weakness: Secondary | ICD-10-CM | POA: Diagnosis not present

## 2016-04-27 DIAGNOSIS — I129 Hypertensive chronic kidney disease with stage 1 through stage 4 chronic kidney disease, or unspecified chronic kidney disease: Secondary | ICD-10-CM | POA: Diagnosis not present

## 2016-04-27 DIAGNOSIS — Z933 Colostomy status: Secondary | ICD-10-CM | POA: Diagnosis not present

## 2016-04-27 DIAGNOSIS — N189 Chronic kidney disease, unspecified: Secondary | ICD-10-CM | POA: Diagnosis not present

## 2016-04-28 LAB — URINE CULTURE: Culture: <10000 — AB

## 2016-05-01 DIAGNOSIS — I129 Hypertensive chronic kidney disease with stage 1 through stage 4 chronic kidney disease, or unspecified chronic kidney disease: Secondary | ICD-10-CM | POA: Diagnosis not present

## 2016-05-01 DIAGNOSIS — R531 Weakness: Secondary | ICD-10-CM | POA: Diagnosis not present

## 2016-05-01 DIAGNOSIS — Z933 Colostomy status: Secondary | ICD-10-CM | POA: Diagnosis not present

## 2016-05-01 DIAGNOSIS — N189 Chronic kidney disease, unspecified: Secondary | ICD-10-CM | POA: Diagnosis not present

## 2016-05-04 DIAGNOSIS — Z933 Colostomy status: Secondary | ICD-10-CM | POA: Diagnosis not present

## 2016-05-04 DIAGNOSIS — N189 Chronic kidney disease, unspecified: Secondary | ICD-10-CM | POA: Diagnosis not present

## 2016-05-04 DIAGNOSIS — R531 Weakness: Secondary | ICD-10-CM | POA: Diagnosis not present

## 2016-05-04 DIAGNOSIS — I129 Hypertensive chronic kidney disease with stage 1 through stage 4 chronic kidney disease, or unspecified chronic kidney disease: Secondary | ICD-10-CM | POA: Diagnosis not present

## 2016-05-09 DIAGNOSIS — N189 Chronic kidney disease, unspecified: Secondary | ICD-10-CM | POA: Diagnosis not present

## 2016-05-09 DIAGNOSIS — R531 Weakness: Secondary | ICD-10-CM | POA: Diagnosis not present

## 2016-05-09 DIAGNOSIS — I129 Hypertensive chronic kidney disease with stage 1 through stage 4 chronic kidney disease, or unspecified chronic kidney disease: Secondary | ICD-10-CM | POA: Diagnosis not present

## 2016-05-09 DIAGNOSIS — Z933 Colostomy status: Secondary | ICD-10-CM | POA: Diagnosis not present

## 2016-05-12 ENCOUNTER — Encounter (HOSPITAL_COMMUNITY): Payer: Self-pay | Admitting: Hematology & Oncology

## 2016-05-12 ENCOUNTER — Encounter (HOSPITAL_COMMUNITY): Payer: Medicare Other | Attending: Hematology & Oncology | Admitting: Hematology & Oncology

## 2016-05-12 ENCOUNTER — Encounter (HOSPITAL_COMMUNITY): Payer: Medicare Other

## 2016-05-12 VITALS — BP 147/87 | HR 86 | Temp 98.1°F | Resp 16 | Ht 71.0 in | Wt 166.5 lb

## 2016-05-12 DIAGNOSIS — D7589 Other specified diseases of blood and blood-forming organs: Secondary | ICD-10-CM

## 2016-05-12 DIAGNOSIS — I10 Essential (primary) hypertension: Secondary | ICD-10-CM | POA: Insufficient documentation

## 2016-05-12 DIAGNOSIS — Z85038 Personal history of other malignant neoplasm of large intestine: Secondary | ICD-10-CM | POA: Diagnosis not present

## 2016-05-12 DIAGNOSIS — D649 Anemia, unspecified: Secondary | ICD-10-CM

## 2016-05-12 DIAGNOSIS — F209 Schizophrenia, unspecified: Secondary | ICD-10-CM

## 2016-05-12 DIAGNOSIS — C189 Malignant neoplasm of colon, unspecified: Secondary | ICD-10-CM

## 2016-05-12 DIAGNOSIS — F17200 Nicotine dependence, unspecified, uncomplicated: Secondary | ICD-10-CM | POA: Insufficient documentation

## 2016-05-12 DIAGNOSIS — Z933 Colostomy status: Secondary | ICD-10-CM | POA: Insufficient documentation

## 2016-05-12 DIAGNOSIS — K219 Gastro-esophageal reflux disease without esophagitis: Secondary | ICD-10-CM | POA: Diagnosis not present

## 2016-05-12 DIAGNOSIS — D539 Nutritional anemia, unspecified: Secondary | ICD-10-CM | POA: Diagnosis not present

## 2016-05-12 DIAGNOSIS — Z720 Tobacco use: Secondary | ICD-10-CM | POA: Diagnosis not present

## 2016-05-12 DIAGNOSIS — F201 Disorganized schizophrenia: Secondary | ICD-10-CM

## 2016-05-12 LAB — CBC WITH DIFFERENTIAL/PLATELET
BASOS ABS: 0 10*3/uL (ref 0.0–0.1)
Basophils Relative: 0 %
EOS PCT: 2 %
Eosinophils Absolute: 0.3 10*3/uL (ref 0.0–0.7)
HEMATOCRIT: 34.5 % — AB (ref 39.0–52.0)
HEMOGLOBIN: 11 g/dL — AB (ref 13.0–17.0)
LYMPHS ABS: 3.3 10*3/uL (ref 0.7–4.0)
LYMPHS PCT: 30 %
MCH: 32 pg (ref 26.0–34.0)
MCHC: 31.9 g/dL (ref 30.0–36.0)
MCV: 100.3 fL — AB (ref 78.0–100.0)
Monocytes Absolute: 0.9 10*3/uL (ref 0.1–1.0)
Monocytes Relative: 8 %
NEUTROS ABS: 6.7 10*3/uL (ref 1.7–7.7)
NEUTROS PCT: 60 %
Platelets: 352 10*3/uL (ref 150–400)
RBC: 3.44 MIL/uL — AB (ref 4.22–5.81)
RDW: 16.1 % — ABNORMAL HIGH (ref 11.5–15.5)
WBC: 11.1 10*3/uL — AB (ref 4.0–10.5)

## 2016-05-12 LAB — COMPREHENSIVE METABOLIC PANEL
ALK PHOS: 79 U/L (ref 38–126)
ALT: 14 U/L — AB (ref 17–63)
AST: 16 U/L (ref 15–41)
Albumin: 3.8 g/dL (ref 3.5–5.0)
Anion gap: 5 (ref 5–15)
BILIRUBIN TOTAL: 0.4 mg/dL (ref 0.3–1.2)
BUN: 14 mg/dL (ref 6–20)
CALCIUM: 9.9 mg/dL (ref 8.9–10.3)
CO2: 23 mmol/L (ref 22–32)
CREATININE: 1.16 mg/dL (ref 0.61–1.24)
Chloride: 109 mmol/L (ref 101–111)
Glucose, Bld: 75 mg/dL (ref 65–99)
Potassium: 3.8 mmol/L (ref 3.5–5.1)
Sodium: 137 mmol/L (ref 135–145)
Total Protein: 7.6 g/dL (ref 6.5–8.1)

## 2016-05-12 LAB — RETICULOCYTES
RBC.: 3.44 MIL/uL — ABNORMAL LOW (ref 4.22–5.81)
RETIC COUNT ABSOLUTE: 86 10*3/uL (ref 19.0–186.0)
RETIC CT PCT: 2.5 % (ref 0.4–3.1)

## 2016-05-12 LAB — FERRITIN: FERRITIN: 494 ng/mL — AB (ref 24–336)

## 2016-05-12 LAB — IRON AND TIBC
IRON: 38 ug/dL — AB (ref 45–182)
Saturation Ratios: 14 % — ABNORMAL LOW (ref 17.9–39.5)
TIBC: 276 ug/dL (ref 250–450)
UIBC: 238 ug/dL

## 2016-05-12 LAB — VITAMIN B12: Vitamin B-12: 510 pg/mL (ref 180–914)

## 2016-05-12 LAB — FOLATE: Folate: >100 ng/mL (ref >5.9)

## 2016-05-12 LAB — LACTATE DEHYDROGENASE: LDH: 106 U/L (ref 98–192)

## 2016-05-12 LAB — SEDIMENTATION RATE: SED RATE: 45 mm/h — AB (ref 0–16)
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
## 2016-05-12 NOTE — Patient Instructions (Signed)
[place] at [facility] Discharge Instructions  RECOMMENDATIONS MADE BY THE CONSULTANT AND ANY TEST RESULTS WILL BE SENT TO YOUR REFERRING PHYSICIAN.  You saw Dr.Penland today. Lab work today. Follow up in 2 weeks to go over lab work. See Amy at checkout for appointments.  Thank you for choosing [place] at [facility] to provide your oncology and hematology care.  To afford each patient quality time with our provider, please arrive at least 15 minutes before your scheduled appointment time.   Beginning January 23rd 2017 lab work for the [facility] will be done in the  Main lab at [facility] on 1st floor. If you have a lab appointment with the [facility] please come in thru the  Main Entrance and check in at the main information desk  You need to re-schedule your appointment should you arrive 10 or more minutes late.  We strive to give you quality time with our providers, and arriving late affects you and other patients whose appointments are after yours.  Also, if you no show three or more times for appointments you may be dismissed from the clinic at the providers discretion.     Again, thank you for choosing [facility].  Our hope is that these requests will decrease the amount of time that you wait before being seen by our physicians.       _____________________________________________________________  Should you have questions after your visit to [facility], please contact our office at [phone] between the hours of 8:30 a.m. and 4:30 p.m.  Voicemails left after 4:30 p.m. will not be returned until the following business day.  For prescription refill requests, have your pharmacy contact our office.         Resources For Cancer Patients and their Caregivers ? American Cancer Society: Can assist with transportation, wigs, general needs, runs Look Good Feel Better.        206-034-3973 ? Cancer  Care: Provides financial assistance, online support groups, medication/co-pay assistance.  1-800-813-HOPE 815 299 3037) ? [place] Assists [place] cancer patients and their families through emotional , educational and financial support.  (236) 552-6187 ? [place] DSS Where to apply for food stamps, Medicaid and utility assistance. 551-210-2462 ? RCATS: Transportation to medical appointments. 858-682-7283 ? Social Security Administration: May apply for disability if have a Stage IV cancer. 660-810-3406 361-152-7157 ? [employer], Disability and Transit Services: Assists with nutrition, care and transit needs. [place] Support Programs: @10RELATIVEDAYS @ > Cancer Support Group  2nd Tuesday of the month 1pm-2pm, Journey Room  > Creative Journey  3rd Tuesday of the month 1130am-1pm, Journey Room  > Look Good Feel Better  1st Wednesday of the month 10am-12 noon, Journey Room (Call [place] to register [phone])

## 2016-05-13 LAB — HAPTOGLOBIN: HAPTOGLOBIN: 226 mg/dL — AB (ref 34–200)

## 2016-05-13 LAB — IGG, IGA, IGM
IGM, SERUM: 38 mg/dL (ref 20–172)
IgA: 184 mg/dL (ref 90–386)
IgG (Immunoglobin G), Serum: 1141 mg/dL (ref 700–1600)

## 2016-05-13 LAB — TESTOSTERONE: TESTOSTERONE: 250 ng/dL — AB (ref 264–916)

## 2016-05-13 LAB — CEA: CEA: 5.1 ng/mL — AB (ref 0.0–4.7)

## 2016-05-13 LAB — TESTOSTERONE, FREE: TESTOSTERONE FREE: 6.6 pg/mL — AB (ref 6.8–21.5)

## 2016-05-15 LAB — IMMUNOFIXATION ELECTROPHORESIS
IGA: 183 mg/dL (ref 90–386)
IGM, SERUM: 39 mg/dL (ref 20–172)
IgG (Immunoglobin G), Serum: 1153 mg/dL (ref 700–1600)
Total Protein ELP: 6.8 g/dL (ref 6.0–8.5)

## 2016-05-15 LAB — PROTEIN ELECTROPHORESIS, SERUM
A/G RATIO SPE: 1.1 (ref 0.7–1.7)
Albumin ELP: 3.5 g/dL (ref 2.9–4.4)
Alpha-1-Globulin: 0.3 g/dL (ref 0.0–0.4)
Alpha-2-Globulin: 0.8 g/dL (ref 0.4–1.0)
Beta Globulin: 1 g/dL (ref 0.7–1.3)
GLOBULIN, TOTAL: 3.2 g/dL (ref 2.2–3.9)
Gamma Globulin: 1.1 g/dL (ref 0.4–1.8)
TOTAL PROTEIN ELP: 6.7 g/dL (ref 6.0–8.5)

## 2016-05-15 LAB — KAPPA/LAMBDA LIGHT CHAINS
KAPPA FREE LGHT CHN: 22.7 mg/L — AB (ref 3.3–19.4)
Kappa, lambda light chain ratio: 1.22 (ref 0.26–1.65)
LAMDA FREE LIGHT CHAINS: 18.6 mg/L (ref 5.7–26.3)

## 2016-05-16 DIAGNOSIS — R531 Weakness: Secondary | ICD-10-CM | POA: Diagnosis not present

## 2016-05-16 DIAGNOSIS — N189 Chronic kidney disease, unspecified: Secondary | ICD-10-CM | POA: Diagnosis not present

## 2016-05-16 DIAGNOSIS — Z933 Colostomy status: Secondary | ICD-10-CM | POA: Diagnosis not present

## 2016-05-16 DIAGNOSIS — I129 Hypertensive chronic kidney disease with stage 1 through stage 4 chronic kidney disease, or unspecified chronic kidney disease: Secondary | ICD-10-CM | POA: Diagnosis not present

## 2016-05-24 DIAGNOSIS — Z79899 Other long term (current) drug therapy: Secondary | ICD-10-CM | POA: Diagnosis not present

## 2016-05-30 ENCOUNTER — Encounter (HOSPITAL_BASED_OUTPATIENT_CLINIC_OR_DEPARTMENT_OTHER): Payer: Medicare Other | Admitting: Oncology

## 2016-05-30 ENCOUNTER — Encounter (HOSPITAL_COMMUNITY): Payer: Self-pay | Admitting: Oncology

## 2016-05-30 VITALS — BP 139/92 | HR 77 | Temp 98.3°F | Resp 18 | Ht 71.0 in | Wt 166.0 lb

## 2016-05-30 DIAGNOSIS — Z85038 Personal history of other malignant neoplasm of large intestine: Secondary | ICD-10-CM

## 2016-05-30 DIAGNOSIS — D649 Anemia, unspecified: Secondary | ICD-10-CM | POA: Diagnosis not present

## 2016-05-30 DIAGNOSIS — D72829 Elevated white blood cell count, unspecified: Secondary | ICD-10-CM | POA: Diagnosis not present

## 2016-05-30 NOTE — Patient Instructions (Signed)
[place] at [facility] Discharge Instructions  RECOMMENDATIONS MADE BY THE CONSULTANT AND ANY TEST RESULTS WILL BE SENT TO YOUR REFERRING PHYSICIAN.  You were seen today by Kirby Crigler PA-C. Use triple antibiotic ointment to knuckles and use a good moisturizing cream.  Dr. Coralyn Mark in [place], [place] and Odis Luster in [place], [place]. Return in February for labs and follow up.   Thank you for choosing [place] at [facility] to provide your oncology and hematology care.  To afford each patient quality time with our provider, please arrive at least 15 minutes before your scheduled appointment time.   Beginning January 23rd 2017 lab work for the [facility] will be done in the  Main lab at [facility] on 1st floor. If you have a lab appointment with the [place] [facility] please come in thru the  Main Entrance and check in at the main information desk  You need to re-schedule your appointment should you arrive 10 or more minutes late.  We strive to give you quality time with our providers, and arriving late affects you and other patients whose appointments are after yours.  Also, if you no show three or more times for appointments you may be dismissed from the clinic at the providers discretion.     Again, thank you for choosing [facility].  Our hope is that these requests will decrease the amount of time that you wait before being seen by our physicians.       _____________________________________________________________  Should you have questions after your visit to [facility], please contact our office at [phone] between the hours of 8:30 a.m. and 4:30 p.m.  Voicemails left after 4:30 p.m. will not be returned until the following business day.  For prescription refill requests, have your pharmacy contact our office.         Resources For Cancer Patients and their Caregivers ? American Cancer Society: Can assist  with transportation, wigs, general needs, runs Look Good Feel Better.        (251)158-7119 ? Cancer Care: Provides financial assistance, online support groups, medication/co-pay assistance.  1-800-813-HOPE 9417305462) ? [place] Assists [place] cancer patients and their families through emotional , educational and financial support.  606-861-3836 ? [place] DSS Where to apply for food stamps, Medicaid and utility assistance. (479)495-9196 ? RCATS: Transportation to medical appointments. 352-403-1191 ? Social Security Administration: May apply for disability if have a Stage IV cancer. (515)709-9883 205-577-8420 ? [employer], Disability and Transit Services: Assists with nutrition, care and transit needs. [place] Support Programs: @10RELATIVEDAYS @ > Cancer Support Group  2nd Tuesday of the month 1pm-2pm, Journey Room  > Creative Journey  3rd Tuesday of the month 1130am-1pm, Journey Room  > Look Good Feel Better  1st Wednesday of the month 10am-12 noon, Journey Room (Call [place] to register [phone])
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
## 2016-06-07 ENCOUNTER — Emergency Department (HOSPITAL_COMMUNITY)
Admission: EM | Admit: 2016-06-07 | Discharge: 2016-06-08 | Disposition: A | Discharge status: Home / Self Care | Payer: Medicare Other | Attending: Emergency Medicine | Admitting: Emergency Medicine

## 2016-06-07 ENCOUNTER — Encounter (HOSPITAL_COMMUNITY): Payer: Self-pay

## 2016-06-07 DIAGNOSIS — R440 Auditory hallucinations: Secondary | ICD-10-CM | POA: Diagnosis not present

## 2016-06-07 DIAGNOSIS — F250 Schizoaffective disorder, bipolar type: Secondary | ICD-10-CM | POA: Diagnosis present

## 2016-06-07 DIAGNOSIS — N183 Chronic kidney disease, stage 3 (moderate): Secondary | ICD-10-CM | POA: Diagnosis not present

## 2016-06-07 DIAGNOSIS — R45851 Suicidal ideations: Secondary | ICD-10-CM | POA: Diagnosis present

## 2016-06-07 DIAGNOSIS — F17210 Nicotine dependence, cigarettes, uncomplicated: Secondary | ICD-10-CM | POA: Diagnosis not present

## 2016-06-07 DIAGNOSIS — R45850 Homicidal ideations: Secondary | ICD-10-CM | POA: Diagnosis not present

## 2016-06-07 DIAGNOSIS — F17200 Nicotine dependence, unspecified, uncomplicated: Secondary | ICD-10-CM | POA: Diagnosis not present

## 2016-06-07 DIAGNOSIS — I129 Hypertensive chronic kidney disease with stage 1 through stage 4 chronic kidney disease, or unspecified chronic kidney disease: Secondary | ICD-10-CM | POA: Diagnosis not present

## 2016-06-07 DIAGNOSIS — Z79899 Other long term (current) drug therapy: Secondary | ICD-10-CM | POA: Diagnosis not present

## 2016-06-07 DIAGNOSIS — Z880 Allergy status to penicillin: Secondary | ICD-10-CM | POA: Diagnosis not present

## 2016-06-07 LAB — COMPREHENSIVE METABOLIC PANEL
ALK PHOS: 91 U/L (ref 38–126)
ALT: 15 U/L — ABNORMAL LOW (ref 17–63)
ANION GAP: 7 (ref 5–15)
AST: 18 U/L (ref 15–41)
Albumin: 4.4 g/dL (ref 3.5–5.0)
BILIRUBIN TOTAL: 0.4 mg/dL (ref 0.3–1.2)
BUN: 13 mg/dL (ref 6–20)
CALCIUM: 10.2 mg/dL (ref 8.9–10.3)
CO2: 24 mmol/L (ref 22–32)
Chloride: 107 mmol/L (ref 101–111)
Creatinine, Ser: 1.06 mg/dL (ref 0.61–1.24)
Glucose, Bld: 163 mg/dL — ABNORMAL HIGH (ref 65–99)
POTASSIUM: 3.8 mmol/L (ref 3.5–5.1)
Sodium: 138 mmol/L (ref 135–145)
TOTAL PROTEIN: 7.8 g/dL (ref 6.5–8.1)

## 2016-06-07 LAB — CBC WITH DIFFERENTIAL/PLATELET
BASOS ABS: 0 10*3/uL (ref 0.0–0.1)
BASOS PCT: 0 %
EOS ABS: 0.3 10*3/uL (ref 0.0–0.7)
Eosinophils Relative: 4 %
HEMATOCRIT: 37.4 % — AB (ref 39.0–52.0)
HEMOGLOBIN: 12 g/dL — AB (ref 13.0–17.0)
Lymphocytes Relative: 30 %
Lymphs Abs: 3 10*3/uL (ref 0.7–4.0)
MCH: 31.6 pg (ref 26.0–34.0)
MCHC: 32.1 g/dL (ref 30.0–36.0)
MCV: 98.4 fL (ref 78.0–100.0)
Monocytes Absolute: 0.6 10*3/uL (ref 0.1–1.0)
Monocytes Relative: 6 %
NEUTROS ABS: 5.9 10*3/uL (ref 1.7–7.7)
NEUTROS PCT: 60 %
Platelets: 292 10*3/uL (ref 150–400)
RBC: 3.8 MIL/uL — ABNORMAL LOW (ref 4.22–5.81)
RDW: 15.2 % (ref 11.5–15.5)
WBC: 9.8 10*3/uL (ref 4.0–10.5)

## 2016-06-07 LAB — RAPID URINE DRUG SCREEN, HOSP PERFORMED
Amphetamines: NOT DETECTED
BARBITURATES: NOT DETECTED
BENZODIAZEPINES: NOT DETECTED
COCAINE: NOT DETECTED
OPIATES: NOT DETECTED
TETRAHYDROCANNABINOL: NOT DETECTED

## 2016-06-07 LAB — ETHANOL: ALCOHOL ETHYL (B): 10 mg/dL — AB (ref <5)

## 2016-06-07 MED ORDER — CARVEDILOL 12.5 MG PO TABS
25.0000 mg | ORAL_TABLET | Freq: Two times a day (BID) | ORAL | Status: DC
  Administered 2016-06-08: 25 mg via ORAL
  Filled 2016-06-07: qty 2

## 2016-06-07 MED ORDER — GLYCOPYRROLATE 1 MG PO TABS
1.0000 mg | ORAL_TABLET | Freq: Three times a day (TID) | ORAL | Status: DC
  Administered 2016-06-07 – 2016-06-08 (×3): 1 mg via ORAL
  Filled 2016-06-07 (×5): qty 1

## 2016-06-07 MED ORDER — SERTRALINE HCL 50 MG PO TABS
100.0000 mg | ORAL_TABLET | Freq: Every day | ORAL | Status: DC
  Administered 2016-06-07: 100 mg via ORAL
  Filled 2016-06-07: qty 2

## 2016-06-07 MED ORDER — DESMOPRESSIN ACETATE 0.2 MG PO TABS
0.4000 mg | ORAL_TABLET | Freq: Every day | ORAL | Status: DC
  Administered 2016-06-07: 0.4 mg via ORAL
  Filled 2016-06-07 (×2): qty 2

## 2016-06-07 MED ORDER — HALOPERIDOL 5 MG PO TABS
5.0000 mg | ORAL_TABLET | Freq: Three times a day (TID) | ORAL | Status: DC
  Administered 2016-06-07: 10 mg via ORAL
  Administered 2016-06-08 (×2): 5 mg via ORAL
  Filled 2016-06-07: qty 1
  Filled 2016-06-07: qty 2
  Filled 2016-06-07: qty 1

## 2016-06-07 MED ORDER — DESMOPRESSIN ACETATE 0.2 MG PO TABS
ORAL_TABLET | ORAL | Status: AC
  Filled 2016-06-07: qty 2

## 2016-06-07 MED ORDER — VALPROIC ACID 250 MG PO CAPS
500.0000 mg | ORAL_CAPSULE | Freq: Two times a day (BID) | ORAL | Status: DC
  Administered 2016-06-07 – 2016-06-08 (×2): 500 mg via ORAL
  Filled 2016-06-07 (×4): qty 2

## 2016-06-07 MED ORDER — BENZTROPINE MESYLATE 1 MG PO TABS
1.0000 mg | ORAL_TABLET | Freq: Two times a day (BID) | ORAL | Status: DC
  Administered 2016-06-07 – 2016-06-08 (×2): 1 mg via ORAL
  Filled 2016-06-07 (×2): qty 1

## 2016-06-07 MED ORDER — AMLODIPINE BESYLATE 5 MG PO TABS
10.0000 mg | ORAL_TABLET | Freq: Every day | ORAL | Status: DC
  Administered 2016-06-07: 10 mg via ORAL
  Filled 2016-06-07: qty 2

## 2016-06-07 MED ORDER — FOLIC ACID 1 MG PO TABS
2.0000 mg | ORAL_TABLET | Freq: Every day | ORAL | Status: DC
  Administered 2016-06-08: 2 mg via ORAL
  Filled 2016-06-07: qty 2

## 2016-06-07 NOTE — ED Notes (Signed)
TTS machine at bedside. 
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
## 2016-06-07 NOTE — ED Notes (Signed)
ED Provider at bedside. 

## 2016-06-07 NOTE — ED Notes (Signed)
Pt given a meal tray 

## 2016-06-07 NOTE — ED Notes (Signed)
Pt was sent here by psychiatric intervention. Contact number is Natasha Bence [phone]
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
## 2016-06-07 NOTE — ED Triage Notes (Signed)
Pt lives at [facility] and is brought in by caretaker. Seen by his therapist today and verbalized SI and HI ideations

## 2016-06-07 NOTE — ED Notes (Signed)
Pt states that he has thoughts of wanting to harm others and that voices are telling him to do this.

## 2016-06-08 DIAGNOSIS — F250 Schizoaffective disorder, bipolar type: Secondary | ICD-10-CM

## 2016-06-08 DIAGNOSIS — R440 Auditory hallucinations: Secondary | ICD-10-CM | POA: Diagnosis not present

## 2016-06-08 DIAGNOSIS — Z79899 Other long term (current) drug therapy: Secondary | ICD-10-CM | POA: Diagnosis not present

## 2016-06-08 DIAGNOSIS — F17210 Nicotine dependence, cigarettes, uncomplicated: Secondary | ICD-10-CM | POA: Diagnosis not present

## 2016-06-08 DIAGNOSIS — R45851 Suicidal ideations: Secondary | ICD-10-CM

## 2016-06-08 DIAGNOSIS — R45850 Homicidal ideations: Secondary | ICD-10-CM | POA: Insufficient documentation

## 2016-06-08 DIAGNOSIS — Z880 Allergy status to penicillin: Secondary | ICD-10-CM | POA: Diagnosis not present

## 2016-06-08 LAB — VALPROIC ACID LEVEL: Valproic Acid Lvl: 52 ug/mL (ref 50.0–100.0)

## 2016-06-08 MED ORDER — LORAZEPAM 1 MG PO TABS
2.0000 mg | ORAL_TABLET | Freq: Once | ORAL | Status: AC
  Administered 2016-06-08: 2 mg via ORAL
  Filled 2016-06-08: qty 2

## 2016-06-08 NOTE — ED Notes (Signed)
Jeronimo Greaves, owner of [facility], called at this time. Pt does not meet inpt criteria and will be d/c back to group home.

## 2016-06-08 NOTE — ED Notes (Signed)
Attempted to call [facility] for dispo note, no answer. [facility] secretary states all personnel are busy and to call back in 10 minutes.

## 2016-06-08 NOTE — ED Notes (Signed)
Attempted to call [facility] assessment for dispo note, no answer.

## 2016-06-08 NOTE — ED Notes (Signed)
Pt given lunch tray at this time, sitter at bedside.
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
## 2016-06-08 NOTE — ED Notes (Signed)
Psych re evaluation in process

## 2016-06-08 NOTE — ED Notes (Signed)
TTS completed. 

## 2016-06-08 NOTE — ED Notes (Signed)
D/C papers discussed with pt and group home owner at this time. No complaints, concerns, or questions at this time. Pt ambulatory to d/c at this time.

## 2016-06-16 ENCOUNTER — Encounter (HOSPITAL_COMMUNITY): Payer: Self-pay | Admitting: Emergency Medicine

## 2016-06-16 ENCOUNTER — Emergency Department (HOSPITAL_COMMUNITY)
Admission: EM | Admit: 2016-06-16 | Discharge: 2016-06-30 | Disposition: A | Discharge status: Home / Self Care | Payer: Medicare Other | Attending: Emergency Medicine | Admitting: Emergency Medicine

## 2016-06-16 DIAGNOSIS — F17200 Nicotine dependence, unspecified, uncomplicated: Secondary | ICD-10-CM | POA: Insufficient documentation

## 2016-06-16 DIAGNOSIS — I129 Hypertensive chronic kidney disease with stage 1 through stage 4 chronic kidney disease, or unspecified chronic kidney disease: Secondary | ICD-10-CM | POA: Diagnosis not present

## 2016-06-16 DIAGNOSIS — R443 Hallucinations, unspecified: Secondary | ICD-10-CM | POA: Diagnosis not present

## 2016-06-16 DIAGNOSIS — N183 Chronic kidney disease, stage 3 (moderate): Secondary | ICD-10-CM | POA: Diagnosis not present

## 2016-06-16 DIAGNOSIS — F259 Schizoaffective disorder, unspecified: Secondary | ICD-10-CM | POA: Insufficient documentation

## 2016-06-16 DIAGNOSIS — F250 Schizoaffective disorder, bipolar type: Secondary | ICD-10-CM | POA: Diagnosis not present

## 2016-06-16 DIAGNOSIS — Z9889 Other specified postprocedural states: Secondary | ICD-10-CM | POA: Diagnosis not present

## 2016-06-16 DIAGNOSIS — R440 Auditory hallucinations: Secondary | ICD-10-CM | POA: Diagnosis not present

## 2016-06-16 DIAGNOSIS — Z880 Allergy status to penicillin: Secondary | ICD-10-CM | POA: Diagnosis not present

## 2016-06-16 DIAGNOSIS — F17210 Nicotine dependence, cigarettes, uncomplicated: Secondary | ICD-10-CM | POA: Diagnosis not present

## 2016-06-16 DIAGNOSIS — R45851 Suicidal ideations: Secondary | ICD-10-CM | POA: Diagnosis present

## 2016-06-16 DIAGNOSIS — Z79899 Other long term (current) drug therapy: Secondary | ICD-10-CM | POA: Diagnosis not present

## 2016-06-16 LAB — CBC
HCT: 36.5 % — ABNORMAL LOW (ref 39.0–52.0)
Hemoglobin: 11.8 g/dL — ABNORMAL LOW (ref 13.0–17.0)
MCH: 31.6 pg (ref 26.0–34.0)
MCHC: 32.3 g/dL (ref 30.0–36.0)
MCV: 97.6 fL (ref 78.0–100.0)
PLATELETS: 282 10*3/uL (ref 150–400)
RBC: 3.74 MIL/uL — ABNORMAL LOW (ref 4.22–5.81)
RDW: 14.9 % (ref 11.5–15.5)
WBC: 10.4 10*3/uL (ref 4.0–10.5)

## 2016-06-16 LAB — RAPID URINE DRUG SCREEN, HOSP PERFORMED
Amphetamines: NOT DETECTED
BENZODIAZEPINES: NOT DETECTED
Barbiturates: NOT DETECTED
COCAINE: NOT DETECTED
Opiates: NOT DETECTED
Tetrahydrocannabinol: NOT DETECTED

## 2016-06-16 LAB — ETHANOL

## 2016-06-16 LAB — COMPREHENSIVE METABOLIC PANEL
ALT: 15 U/L — ABNORMAL LOW (ref 17–63)
ANION GAP: 6 (ref 5–15)
AST: 17 U/L (ref 15–41)
Albumin: 4.2 g/dL (ref 3.5–5.0)
Alkaline Phosphatase: 85 U/L (ref 38–126)
BILIRUBIN TOTAL: 0.3 mg/dL (ref 0.3–1.2)
BUN: 13 mg/dL (ref 6–20)
CO2: 24 mmol/L (ref 22–32)
Calcium: 9.4 mg/dL (ref 8.9–10.3)
Chloride: 106 mmol/L (ref 101–111)
Creatinine, Ser: 1.15 mg/dL (ref 0.61–1.24)
GFR calc Af Amer: >60 mL/min (ref >60)
Glucose, Bld: 80 mg/dL (ref 65–99)
POTASSIUM: 3.5 mmol/L (ref 3.5–5.1)
Sodium: 136 mmol/L (ref 135–145)
TOTAL PROTEIN: 7.2 g/dL (ref 6.5–8.1)

## 2016-06-16 LAB — ACETAMINOPHEN LEVEL

## 2016-06-16 LAB — SALICYLATE LEVEL: Salicylate Lvl: <7 mg/dL (ref 2.8–30.0)

## 2016-06-16 MED ORDER — CLOZAPINE 100 MG PO TABS
ORAL_TABLET | ORAL | Status: AC
  Filled 2016-06-16: qty 4

## 2016-06-16 MED ORDER — SERTRALINE HCL 50 MG PO TABS
100.0000 mg | ORAL_TABLET | Freq: Every day | ORAL | Status: DC
  Administered 2016-06-16 – 2016-06-29 (×14): 100 mg via ORAL
  Filled 2016-06-16 (×14): qty 2

## 2016-06-16 MED ORDER — CLOZAPINE 25 MG PO TABS
150.0000 mg | ORAL_TABLET | Freq: Every day | ORAL | Status: DC
  Administered 2016-06-17 – 2016-06-30 (×14): 150 mg via ORAL
  Filled 2016-06-16 (×17): qty 2

## 2016-06-16 MED ORDER — ACETAMINOPHEN 325 MG PO TABS
650.0000 mg | ORAL_TABLET | Freq: Four times a day (QID) | ORAL | Status: DC | PRN
  Administered 2016-06-17 – 2016-06-27 (×2): 650 mg via ORAL
  Filled 2016-06-16 (×2): qty 2

## 2016-06-16 MED ORDER — BENZTROPINE MESYLATE 1 MG PO TABS
1.0000 mg | ORAL_TABLET | Freq: Every day | ORAL | Status: DC
  Administered 2016-06-16 – 2016-06-29 (×14): 1 mg via ORAL
  Filled 2016-06-16 (×14): qty 1

## 2016-06-16 MED ORDER — CLOZAPINE 100 MG PO TABS: 150.0000 mg | ORAL_TABLET | Freq: Two times a day (BID) | ORAL | Status: DC

## 2016-06-16 MED ORDER — DESMOPRESSIN ACETATE 0.2 MG PO TABS
0.4000 mg | ORAL_TABLET | Freq: Every day | ORAL | Status: DC
  Administered 2016-06-16 – 2016-06-29 (×14): 0.4 mg via ORAL
  Filled 2016-06-16 (×17): qty 2

## 2016-06-16 MED ORDER — AMLODIPINE BESYLATE 5 MG PO TABS
10.0000 mg | ORAL_TABLET | Freq: Every day | ORAL | Status: DC
  Administered 2016-06-16 – 2016-06-29 (×14): 10 mg via ORAL
  Filled 2016-06-16 (×14): qty 2

## 2016-06-16 MED ORDER — DESMOPRESSIN ACETATE 0.2 MG PO TABS
ORAL_TABLET | ORAL | Status: AC
  Filled 2016-06-16: qty 2

## 2016-06-16 MED ORDER — TRAZODONE HCL 50 MG PO TABS
100.0000 mg | ORAL_TABLET | Freq: Every evening | ORAL | Status: DC | PRN
  Administered 2016-06-17 – 2016-06-27 (×8): 100 mg via ORAL
  Filled 2016-06-16 (×9): qty 2

## 2016-06-16 MED ORDER — CARVEDILOL 12.5 MG PO TABS
25.0000 mg | ORAL_TABLET | Freq: Two times a day (BID) | ORAL | Status: DC
  Administered 2016-06-17 – 2016-06-30 (×27): 25 mg via ORAL
  Filled 2016-06-16 (×29): qty 2

## 2016-06-16 MED ORDER — CLOZAPINE 25 MG PO TABS
350.0000 mg | ORAL_TABLET | Freq: Every day | ORAL | Status: DC
  Administered 2016-06-16 – 2016-06-29 (×14): 350 mg via ORAL
  Filled 2016-06-16 (×17): qty 2

## 2016-06-16 MED ORDER — CLOZAPINE 25 MG PO TABS: 50.0000 mg | ORAL_TABLET | Freq: Two times a day (BID) | ORAL | Status: DC

## 2016-06-16 MED ORDER — HALOPERIDOL 5 MG PO TABS
5.0000 mg | ORAL_TABLET | Freq: Three times a day (TID) | ORAL | Status: DC
  Administered 2016-06-16 – 2016-06-17 (×3): 10 mg via ORAL
  Administered 2016-06-18 (×2): 5 mg via ORAL
  Administered 2016-06-18 – 2016-06-19 (×2): 10 mg via ORAL
  Administered 2016-06-19 (×3): 5 mg via ORAL
  Administered 2016-06-20 – 2016-06-22 (×8): 10 mg via ORAL
  Administered 2016-06-22: 5 mg via ORAL
  Administered 2016-06-23 (×3): 10 mg via ORAL
  Administered 2016-06-24 (×3): 5 mg via ORAL
  Administered 2016-06-25: 10 mg via ORAL
  Administered 2016-06-25 – 2016-06-26 (×3): 5 mg via ORAL
  Administered 2016-06-26: 10 mg via ORAL
  Administered 2016-06-26: 5 mg via ORAL
  Administered 2016-06-27: 10 mg via ORAL
  Administered 2016-06-27 – 2016-06-28 (×4): 5 mg via ORAL
  Administered 2016-06-29 (×2): 10 mg via ORAL
  Administered 2016-06-29 – 2016-06-30 (×2): 5 mg via ORAL
  Filled 2016-06-16: qty 2
  Filled 2016-06-16: qty 1
  Filled 2016-06-16: qty 2
  Filled 2016-06-16: qty 1
  Filled 2016-06-16 (×8): qty 2
  Filled 2016-06-16: qty 1
  Filled 2016-06-16 (×3): qty 2
  Filled 2016-06-16: qty 1
  Filled 2016-06-16: qty 2
  Filled 2016-06-16: qty 1
  Filled 2016-06-16 (×3): qty 2
  Filled 2016-06-16 (×2): qty 1
  Filled 2016-06-16 (×2): qty 2
  Filled 2016-06-16: qty 1
  Filled 2016-06-16: qty 2
  Filled 2016-06-16 (×5): qty 1
  Filled 2016-06-16: qty 2
  Filled 2016-06-16: qty 1
  Filled 2016-06-16: qty 2
  Filled 2016-06-16 (×4): qty 1
  Filled 2016-06-16 (×2): qty 2

## 2016-06-16 MED ORDER — HALOPERIDOL 5 MG PO TABS
5.0000 mg | ORAL_TABLET | Freq: Two times a day (BID) | ORAL | Status: DC | PRN
  Administered 2016-06-16 – 2016-06-27 (×6): 5 mg via ORAL
  Filled 2016-06-16 (×4): qty 1

## 2016-06-16 MED ORDER — IPRATROPIUM BROMIDE 0.03 % NA SOLN
2.0000 | Freq: Two times a day (BID) | NASAL | Status: DC | PRN
  Filled 2016-06-16: qty 30

## 2016-06-16 MED ORDER — FOLIC ACID 1 MG PO TABS
2.0000 mg | ORAL_TABLET | Freq: Every morning | ORAL | Status: DC
  Administered 2016-06-17 – 2016-06-30 (×14): 2 mg via ORAL
  Filled 2016-06-16 (×15): qty 2

## 2016-06-16 NOTE — Progress Notes (Addendum)
Pharmacy Clarification Re: Clozapine.  Patient reports current dose of clozapine as 150 mg po qday and 350 mg po qhs.  Reviewed patients clozapine status in the Clozapine [facility] program.   Result: patient eligible to receive clozapine.  Most recent Ace[place]ia reported as 7045 per uL on 05/24/16 with a reported monitoring frequency of q4 weeks.  Pharmacy to follow.  Suggest checking ANC if patient is admitted.   [place] PharmD BCPS 06/16/2016 7:27 PM
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
## 2016-06-16 NOTE — BH Assessment (Signed)
Paperwork faxed out to [place], [place], [place], [place], [place], [place], [place], [place], [place], [place], [place], [place]

## 2016-06-16 NOTE — BHH Counselor (Signed)
Attempted assessment.  Advised that Pt was not available due to placement in hallway.  Nurse asked that TTS call in .5 hours (approx 1800).

## 2016-06-16 NOTE — BHH Counselor (Signed)
Spoke with representative from group home -- [phone].  They advised that Pt eloped from group home twice today and repeatedly threatened to kill himself.  Pt's ACTT visited today and petitioned for IVC.
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
## 2016-06-16 NOTE — ED Notes (Signed)
Pt is agitated, talking to self, attempting to go through cabinets in room.

## 2016-06-16 NOTE — ED Triage Notes (Signed)
Pt  Is wanting out of group home, he wandering off, states the place is haunted and he does not want to stay. Pt denies SI. " I want to go somewhere I am safe"

## 2016-06-17 DIAGNOSIS — F259 Schizoaffective disorder, unspecified: Secondary | ICD-10-CM | POA: Diagnosis not present

## 2016-06-17 LAB — CBC WITH DIFFERENTIAL/PLATELET
Basophils Absolute: 0 10*3/uL (ref 0.0–0.1)
Basophils Relative: 0 %
EOS ABS: 0.3 10*3/uL (ref 0.0–0.7)
Eosinophils Relative: 2 %
HCT: 38.3 % — ABNORMAL LOW (ref 39.0–52.0)
HEMOGLOBIN: 12.3 g/dL — AB (ref 13.0–17.0)
LYMPHS ABS: 3.1 10*3/uL (ref 0.7–4.0)
LYMPHS PCT: 29 %
MCH: 31.4 pg (ref 26.0–34.0)
MCHC: 32.1 g/dL (ref 30.0–36.0)
MCV: 97.7 fL (ref 78.0–100.0)
MONOS PCT: 8 %
Monocytes Absolute: 0.9 10*3/uL (ref 0.1–1.0)
NEUTROS PCT: 61 %
Neutro Abs: 6.5 10*3/uL (ref 1.7–7.7)
Platelets: 280 10*3/uL (ref 150–400)
RBC: 3.92 MIL/uL — AB (ref 4.22–5.81)
RDW: 15.1 % (ref 11.5–15.5)
WBC: 10.7 10*3/uL — ABNORMAL HIGH (ref 4.0–10.5)

## 2016-06-17 MED ORDER — LORAZEPAM 1 MG PO TABS
1.0000 mg | ORAL_TABLET | Freq: Once | ORAL | Status: AC
  Administered 2016-06-17: 1 mg via ORAL
  Filled 2016-06-17: qty 1
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
## 2016-06-17 NOTE — ED Notes (Signed)
meds given- pt request salad for snack which was provided. He is polite and conversant calling this RN sweetheart and reports that she is an Building services engineer. He is redirected and encouraged to rest and attempt to sleep as it is restorative

## 2016-06-17 NOTE — ED Notes (Signed)
Pt up to restroom. Upon RN entering restroom, commode found stuffed with toilet paper and soap dispenser removed from wall and floating in toilet.   RN informed sitters pt may no longer use restroom and can have urinal in room.

## 2016-06-17 NOTE — ED Notes (Signed)
Pt attempted to leave ED,yelling and cursing. Walking up hallway. Security and [employer] called. Pt redirected back to room by sitter, [place]. Pt sitting on stretcher crying talking with sitter.

## 2016-06-17 NOTE — ED Notes (Signed)
Pt jumping up and down in room, yelling and cursing "y'all leave me alone! I ain't fucking with y'all". Pt appears to be responding to internal stimuli.

## 2016-06-17 NOTE — ED Notes (Addendum)
Pt reports that he has heard voices for a long time. He reports, "I carry them with me everywhere I go" Describes them as his friends. States he walks and walked from [place] to [place] and as he walked, released the souls of animals that were on the side of the road as well as blessed the live animals that he saw on the way-"They deserve their life too". He reports that he does not cuss, that it is the devil that makes him cuss. He reports that he is originally from [place], [place] and is unsure if he has family there anymore as he has not been there for a long time.   He spoke with this nurse after becoming a bit agitated and initially refusing Haldol PRN, but then took the med and when asked if he would speak with nurse, began to share his thoughts. He was initially scattalogical, then apologized saying that the language was not him. He states that he is hearing voices and implies that he is also having visual hallucinations in the three friends reference, that are telling him to do things he does not wish to do. He then apologized to the sitter for being rude and disruptive.
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
## 2016-06-17 NOTE — BHH Counselor (Signed)
Pt reassessed.  Per nurse's report, Pt has been acting bizarrely -- destroying the bathroom, stating that he is Mario Ortega.  Pt was calm during re-assessment.  He denied suicidal ideation or homicidal ideation.  He stated that he is Mario Ortega and wants to go to the gym.

## 2016-06-17 NOTE — ED Notes (Signed)
Out of bed to bathroom- after several minutes, door opened to find patient not on commode, but handling the urine cups and wipes. He is escorted back to rm 16 where he was angry. She was asked to lie down, he did and was offered sprite and crackers for a snack and encouraged to rest until breakfast.

## 2016-06-17 NOTE — ED Notes (Signed)
Pt reports that his voices are abating, that he no longer hears them and that he is better. He is quiet and polite. His ostomy has popped off and AC was called for replacement and will bring.   Pt reports that his ostomy should be emptied every 2 hours  He reports that he likes to work, be active and he does not like sitting at homes where he has been where everyone sits and noones moves getting a large belly

## 2016-06-17 NOTE — ED Notes (Signed)
Pt called this RN to room, states he "ain't playing games" asks if I see "the green line on the floor, the devil's backbone". This RN states to pt that I do not see it, but I believe that he does, however he is safe. Pt redirected to watching tv and offered materials to color with.

## 2016-06-17 NOTE — ED Notes (Signed)
Pt up to restroom to wash in sink, given paper scrubs.  RN entered restroom after pt exited to find feces smeared on floor. Upon assesment of pt, RN found pt had removed entire rlq colostomy bag and dressing. RN washed stool from pt's legs and feet. AC called for colostomy supplies.   Pt lying on stretcher at this time.

## 2016-06-17 NOTE — Progress Notes (Signed)
Roderic Palau from [facility] called and stated that referral is now being reviewed by the doctor.  Verlon Setting, [place] Disposition staff 06/17/2016 10:35 AM

## 2016-06-17 NOTE — ED Notes (Signed)
Pt lying on stretcher looking at ceiling, appears to be responding to internal stimuli, singing loudly and laughing hysterically.

## 2016-06-17 NOTE — ED Notes (Addendum)
Pt out of bed- linens changed and one and one half tabs of clozaril found in bed- pt's ostomy emptied and pt returned to bed

## 2016-06-17 NOTE — ED Notes (Signed)
Out of bed to bathroom 

## 2016-06-18 DIAGNOSIS — F259 Schizoaffective disorder, unspecified: Secondary | ICD-10-CM | POA: Diagnosis not present

## 2016-06-18 NOTE — ED Notes (Signed)
Pt arranging towels on the counter for a project as he reports. He is conversant and is reassured that he has had a good nights sleep as well as having had his ostomy emptied on a regular schedule.

## 2016-06-18 NOTE — ED Notes (Signed)
Pt's colostomy emptied.

## 2016-06-18 NOTE — ED Notes (Signed)
Pt is repeated making bed and folding towels at this time. Pt is calm and quiet.

## 2016-06-18 NOTE — ED Notes (Signed)
Pt called out asking for "something sweet", specifically strawberry candy. He was told that the ER does not keep candy in stock. He continued, stating "I know who you really are". When ask what he meant, he claimed that I was his cousin and that he was from [place] and [place].

## 2016-06-18 NOTE — ED Notes (Signed)
Ostomy bag emptied ?

## 2016-06-18 NOTE — ED Notes (Signed)
Colostomy bag emptied at this time. Pt had excessive amount of blankets on bed. Multiple blankets removed and pt covered.

## 2016-06-18 NOTE — ED Notes (Signed)
Pt began getting agitated and walking out of room stating "I'm done with y'all. It ain't funny, you're messing with my life". Pt redirected by sitter and security making rounds at this time.

## 2016-06-18 NOTE — BHH Counselor (Addendum)
Re-assessment-Pt denies SI/HI and AVH.  Per Mickel Baas, NP Pt will be re-evaluated in the am.  Lorenza Cambridge, [facility] Triage Specialist

## 2016-06-18 NOTE — ED Notes (Signed)
Colostomy emptied at this time.

## 2016-06-18 NOTE — ED Notes (Signed)
Patient verbally loud and sitting up in bed with shirt raised stating that he is not a"punk". States that his family has  abandon  Him. States that the devil is trying to kill him. States that he used to get high but he does not do that anymore.

## 2016-06-18 NOTE — ED Notes (Signed)
Pt incontinent of urine. Linens changed and new change of clothing.   Crackers and water for offer of snack with pt return to bed and encouraged to sleep

## 2016-06-18 NOTE — ED Notes (Signed)
Pt is asleep with even, unlabored respirations.   His ostomy bag is emptied of soft brown stool  His urinal emptied of 650 cc urine

## 2016-06-18 NOTE — ED Notes (Signed)
Pt slept much of night without awakening

## 2016-06-18 NOTE — ED Notes (Signed)
Pt taunting fellow pt in the hallway, pt redirected and told to remain calm.

## 2016-06-19 DIAGNOSIS — F259 Schizoaffective disorder, unspecified: Secondary | ICD-10-CM | POA: Diagnosis not present

## 2016-06-19 MED ORDER — ZIPRASIDONE MESYLATE 20 MG IM SOLR
INTRAMUSCULAR | Status: AC
  Administered 2016-06-19: 20 mg via INTRAMUSCULAR
  Filled 2016-06-19: qty 20

## 2016-06-19 MED ORDER — STERILE WATER FOR INJECTION IJ SOLN
INTRAMUSCULAR | Status: AC
  Filled 2016-06-19: qty 10

## 2016-06-19 MED ORDER — ZIPRASIDONE MESYLATE 20 MG IM SOLR
20.0000 mg | Freq: Once | INTRAMUSCULAR | Status: AC
  Administered 2016-06-19: 20 mg via INTRAMUSCULAR

## 2016-06-19 NOTE — Progress Notes (Addendum)
Followed up on inpatient referral efforts.  Pt referred to: [facility]- per Manuela Schwartz, able to consider pts with colostomy bag on case-by-case basis [place] [facility]- per Lajuana Matte, unlikely unit will be able to accommodate caring for colostomy bag but advises re-fax referral and admitting MD will consider  [facility] and [facility] both at capacity today, but both advise facilities are able to consider pts with colostomy bag depending on situation, milieu, acuity of unit, etc. And advise call back if still seeking placement in upcoming days)  Pt declined at: [facility]- no adult male unit, unable to consider for gero due to age restriction [facility]- due to acuity Rafael Capo  All due to colostomy bag being exclusionary  Sharren Bridge, MSW, [place] Work, Disposition  06/19/2016 [phone]

## 2016-06-19 NOTE — ED Notes (Signed)
Patient having breakthrough hallucinations, has became increaseingly aggressive over the last hour or so. Walked out of room and walked up to the sitters, took one if the sitters belongings and tried to take them back to his room, stating they were his. 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 Unable to reorient patient. Security called and Dr. Tomi Bamberger informed. Order for one time dose of Geodon given.

## 2016-06-19 NOTE — ED Notes (Signed)
Ostomy bag emptied at this time

## 2016-06-19 NOTE — ED Notes (Signed)
Pt out in hall.  Asked pt to go back in room.  Pt refuses.  Pt talking loudly.  Cursing loudly.  Asked pt to keep his voice down.  [place] PD called.  Security called.

## 2016-06-19 NOTE — ED Notes (Signed)
Emptied colostomy bag.  Moderate amount brown formed stool.  Pt cooperative.

## 2016-06-19 NOTE — ED Notes (Signed)
Emptied colostomy bag at this time

## 2016-06-19 NOTE — ED Notes (Signed)
Sheriff's deputy arrived.  Pt refusing to stay in room and cooperate.  Pt shackeled to bed by deputy.

## 2016-06-19 NOTE — ED Notes (Signed)
Called [facility] to come to ED as advised by Nurse for safety reasons.

## 2016-06-20 DIAGNOSIS — F259 Schizoaffective disorder, unspecified: Secondary | ICD-10-CM | POA: Diagnosis not present

## 2016-06-20 NOTE — Progress Notes (Signed)
Referred pt to [facility] with authorization number [id number]. (from [place]) Gave verbal referral information to Joe. Faxed referral to [facility] at [phone].  Sharren Bridge, MSW, LCSW Clinical Social Work, Disposition  06/20/2016 [phone]

## 2016-06-20 NOTE — ED Notes (Signed)
Pt sitting on side of bed, carrying on conversation about multiple topic to himself

## 2016-06-20 NOTE — Progress Notes (Deleted)
Pt Follow-up / TTS assessment.  Spoke with tp. Nurse stated pt. Today has been fairly cooperative, but was upset after leaving playroom today.  Patient denies current SI/HI and AVH. When asked pt. About thought/feelings to hurt others, denies. Patient did state that often when he has to stop playing he gets upset and angry.  Consult with Mickel Baas, NP who states to continue with Dr. Loni Muse , psych MD recommendations and continue to seek placement for inpatient. Shatira Dobosz K. Nash Shearer, LPC-A, [facility]  Counselor 06/20/2016 3:08 PM

## 2016-06-20 NOTE — BHH Counselor (Signed)
[facility] called and requested H & P. Writer faxed H & P from 06/16/16 to [facility].  Arnold Long, [place] Therapeutic Triage Specialist

## 2016-06-20 NOTE — Progress Notes (Signed)
Spoke with Cedric at [facility]- advised pt was declined yesterday due to no bed availability but to re-fax his referral so it can be considered for bed openings today.  Initiated [facility] referral by requesting authorization # from [facility] (faxed form to [phone]). Will proceed once auth# granted.  Pt declined at: [facility] [address] [employer] [facility] due to colostomy bag. [facility] due to acuity.  No bed availability at other facilities contacted.  Sharren Bridge, MSW, LCSW Clinical Social Work, Disposition  06/20/2016 [phone]

## 2016-06-20 NOTE — ED Notes (Signed)
Pt eating lunch

## 2016-06-20 NOTE — ED Notes (Signed)
Pt continues to eat breakfast, very slowly, word salad, talking about animals in the room, Talk to "butch" asking him to get out of the bed.

## 2016-06-20 NOTE — BH Assessment (Signed)
Writer reassessed pt today. Pt is somewhat cooperative but becomes irritated easily. He is oriented to person and place only. Pt reports he slept restlessly last night. Pt is wearing shackles on his legs. Pt's thought process is disorganized. His speech is tangential at times. Pt is holding a sheet of paper that appears to have a hand shape outline. He tells Probation officer this is "my bible". He then talks about God. Pt denies SI and HI. Pt had good eye contact. Pt appears restless. Per chart review, he lives at [facility] [phone] and he is seen by [facility] [phone]. Pt reports he threw his salad behind bed. Pt is poor historian. Pt denied he wanted to kill people at his assisted living facility. Pt talked about giving the RN and medical staff his money b/c if "you can't take it with you". Pt tells Probation officer about his male friend at [employer]. He reports he has a male friend in [place] who is a good source of support. Writer ran pt by Elmarie Shiley NP who continues to recommend inpatient treatment. Upon presentation to [place], pt had run away from his assisted living facility twice and had threatened to kill the other residents. He says he completed 10th grade in high school. TTS will continue to seek placement.   Arnold Long, [place] Therapeutic Triage Specialist

## 2016-06-20 NOTE — ED Notes (Signed)
telepsy in progress, pt becomes agitated easily during questions. Answers inappropriately.

## 2016-06-20 NOTE — ED Notes (Signed)
Pt requesting a salad, ordered and pt is eating well.

## 2016-06-21 DIAGNOSIS — F259 Schizoaffective disorder, unspecified: Secondary | ICD-10-CM | POA: Diagnosis not present

## 2016-06-21 NOTE — ED Notes (Signed)
Colostomy bag emptied.

## 2016-06-21 NOTE — Progress Notes (Addendum)
Spoke with Robinette at [facility] who states H&P was not received yesterday- Probation officer re-faxed. Robinette states pt still under review, not yet on waiting list.   Sharren Bridge, MSW, LCSW Clinical Social Work, Disposition  06/21/2016 [id number]  10:00 Pamala Hurry, intake RN at [facility] called requesting pt's vital signs, noting that BP high. CSW faxed most recent vitals. Pamala Hurry states, "His blood pressure readings are outside our parameters for admission- I will have the medical team review and call back."

## 2016-06-21 NOTE — BHH Counselor (Signed)
Reassessed client.  Per nursing staff, client is calmer today, although he continues to be shackled at the feet.  Client denied suicidal or homicidal ideation -- he acknowledged that he expressed that earlier. Early in assessment, Pt became irritated and began to engage in tangential conversation -- "returning to get my women back" and other non-responsive statements.  Client's thought processes were rapid; thought content was largely incoherent.  Discussed with C Withrow, DNP, who recommended continued inpatient placement.

## 2016-06-21 NOTE — ED Notes (Signed)
TTS reevaluation in progress.

## 2016-06-22 DIAGNOSIS — F259 Schizoaffective disorder, unspecified: Secondary | ICD-10-CM | POA: Diagnosis not present

## 2016-06-22 MED ORDER — NICOTINE 21 MG/24HR TD PT24
MEDICATED_PATCH | TRANSDERMAL | Status: AC
  Administered 2016-06-22: 21 mg via TRANSDERMAL
  Filled 2016-06-22: qty 1

## 2016-06-22 MED ORDER — NICOTINE 21 MG/24HR TD PT24
21.0000 mg | MEDICATED_PATCH | Freq: Once | TRANSDERMAL | Status: AC
  Administered 2016-06-22: 21 mg via TRANSDERMAL

## 2016-06-22 NOTE — ED Notes (Signed)
Pts colostomy bag was emptied.   While emptying, pt began to talk to nurse about his pitbull/alaskin shepard dog that was named butch. Nurse asked where this dog was and he responded with, "he's right here in the bed with me, don't you see him?"

## 2016-06-22 NOTE — ED Notes (Signed)
Pt colostomy bag emptied.

## 2016-06-22 NOTE — ED Notes (Signed)
Pt has meal tray and is eating at this time.

## 2016-06-22 NOTE — ED Notes (Addendum)
Pt sleeping at this time. Equal rise and fall noted to his chest

## 2016-06-22 NOTE — BHH Counselor (Addendum)
Re-assessment- Pt reports SI. Pt having active hallucinations and delusions.  TTS will continue to seek inpatient treatment.  Lorenza Cambridge, [facility] Triage Specialist

## 2016-06-22 NOTE — ED Notes (Signed)
Pt given meal tray. Before meal tray was given, pt was restlessly standing at the door way brushing is teeth and staring back and forth at each wall.

## 2016-06-22 NOTE — Progress Notes (Signed)
Sonia Baller at [facility] called requesting update as to pt's presentation. [phone]). CSW provided description based on documentation in chart.  Asked if ACTT has been consulted with re: pt's medications. CSW expressed that would be shared with psychiatry provider.   Sharren Bridge, MSW, LCSW Clinical Social Work, Disposition  06/22/2016 [phone]

## 2016-06-22 NOTE — ED Notes (Signed)
Pt needs re-direction to eat his breakfast. Pt pacing in room and is restless.

## 2016-06-23 ENCOUNTER — Encounter (HOSPITAL_COMMUNITY): Payer: Self-pay | Admitting: Family

## 2016-06-23 DIAGNOSIS — F17210 Nicotine dependence, cigarettes, uncomplicated: Secondary | ICD-10-CM

## 2016-06-23 DIAGNOSIS — Z79899 Other long term (current) drug therapy: Secondary | ICD-10-CM | POA: Diagnosis not present

## 2016-06-23 DIAGNOSIS — R440 Auditory hallucinations: Secondary | ICD-10-CM | POA: Diagnosis not present

## 2016-06-23 DIAGNOSIS — F259 Schizoaffective disorder, unspecified: Secondary | ICD-10-CM | POA: Diagnosis not present

## 2016-06-23 DIAGNOSIS — F250 Schizoaffective disorder, bipolar type: Secondary | ICD-10-CM | POA: Diagnosis not present

## 2016-06-23 DIAGNOSIS — Z880 Allergy status to penicillin: Secondary | ICD-10-CM

## 2016-06-23 DIAGNOSIS — Z9889 Other specified postprocedural states: Secondary | ICD-10-CM

## 2016-06-23 NOTE — ED Notes (Signed)
Pt awoken.  Bed noted to be saturated in urine.  Puddles of urine in floor.  Pt and room cleaned.  Pt cooperative when taking medication.

## 2016-06-23 NOTE — ED Notes (Signed)
Ostomy bag changed. 

## 2016-06-23 NOTE — ED Notes (Signed)
Pt cooperative, but calling staff "mama".

## 2016-06-23 NOTE — ED Notes (Signed)
Pt offered shower but states he prefers to sleep for a bit

## 2016-06-23 NOTE — ED Notes (Signed)
Spoke with [facility] who states pt cannot be legally detained if he attempts to leave.  States pt does not meet criteria to be involuntarily committed and even though he is low functioning, he can make decisions.

## 2016-06-23 NOTE — ED Notes (Signed)
Pts colostomy bag emptied

## 2016-06-23 NOTE — ED Notes (Signed)
Pt sleeping at this time. Even rise and fall of chest noted.  

## 2016-06-23 NOTE — Progress Notes (Signed)
Pt remains on [facility] waiting list per Richardson Landry. Per ED, pt's IVC to expire today. Pt to be re-evaluated via telepsych for continued monitoring and advise as to IVC renewal.  Sharren Bridge, MSW, LCSW Clinical Social Work, Disposition  06/23/2016 [phone]

## 2016-06-23 NOTE — ED Notes (Signed)
Spoke with Moshe Salisbury from social work who states group home is refusing to take pt back.  She has filed appropriate reports with DSS.  Will await new placement for pt.

## 2016-06-23 NOTE — ED Notes (Signed)
Spoke with Jeronimo Greaves at Group home.  States pt is not welcome to come back there due to his past behavior.  States he thought due to involuntary commitment order, pt had to stay in the hospital.  Advised he was ready to be discharged and would need to come back to the group home.  States they will not accept him back and we would need to find other arrangements.  Consult to social work placed.

## 2016-06-23 NOTE — Consult Note (Signed)
Telepsych Consultation   Reason for Consult:  Suicidal Ideation Referring Physician:  EDP Patient Identification: Mario Ortega MRN:  [id number] Principal Diagnosis: Schizoaffective disorder, bipolar type [facility])   Diagnosis:   Patient Active Problem List   Diagnosis Date Noted  . Schizoaffective disorder, bipolar type (Mario Ortega) [F25.0]     Priority: High  . Auditory hallucinations [R44.0]   . Homicidal ideation [R45.850]   . Suicidal ideation [R45.851]   . Anemia [D64.9] 05/30/2016  . Pill-induced gastritis [K29.70, T50.905A] 03/16/2016  . Upper abdominal pain [R10.10]   . Altered mental status [R41.82] 03/11/2016  . Thrombocytopenia ([place]) [D69.6] 03/09/2016  . AKI (acute kidney injury) ([place]) [N17.9] 03/09/2016  . CKD (chronic kidney disease), stage III [N18.3] 03/09/2016  . Lethargy [R53.83] 03/09/2016  . Dehydration [E86.0] 03/09/2016  . Tobacco use disorder [F17.200] 05/13/2015  . MRSA (methicillin resistant Staphylococcus aureus) [A49.02] 05/13/2015  . Schizophrenia ([place]) [F20.9] 05/12/2015  . HTN (hypertension) [I10] 05/09/2015  . GERD (gastroesophageal reflux disease) [K21.9] 05/09/2015  . GI bleed [K92.2] 04/07/2015    Total Time spent with patient: 30 minutes   Subjective:   Mario Ortega is a 50 y.o. male patient admitted with suicidal ideation and command auditory hallucinations. Pt has been in the hospital for 7 days and has improved greatly. Pt seen and chart reviewed. Pt is alert/oriented x4, calm, cooperative, and appropriate to situation. Pt denies suicidal/homicidal ideation and psychosis and does not appear to be responding to internal stimuli. Pt states he has a long history of depression but feels better and would like to go home.   HPI:  I have reviewed and concur with HPI elements below, modified as follows: Writer reassessed pt today. Pt is somewhat cooperative but becomes irritated easily. He is oriented to person and place only. Pt reports he slept  restlessly last night. Pt is wearing shackles on his legs. Pt's thought process is disorganized. His speech is tangential at times. Pt is holding a sheet of paper that appears to have a hand shape outline. He tells Probation officer this is "my bible". He then talks about God. Pt denies SI and HI. Pt had good eye contact. Pt appears restless. Per chart review, he lives at [facility] [phone] and he is seen by [facility] [phone]. Pt reports he threw his salad behind bed. Pt is poor historian. Pt denied he wanted to kill people at his assisted living facility. Pt talked about giving the RN and medical staff his money b/c if "you can't take it with you". Pt tells Probation officer about his male friend at [employer]. He reports he has a male friend in [place] who is a good source of support.   As of today, pt spent the night in ED without incident and has been here 7 days. He has improved greatly. He has presented to ED 6 times in 6 months and is known to have a high degree of secondary gain which may or may not affect the validity of his statements regarding psychiatric treatment.   Past Psychiatric History: Depression, Suicidal and Homicidal ideation, MDD, Schizoaffective disorder.  Risk to Self: Suicidal Ideation: Yes-Currently Present Suicidal Intent: Yes-Currently Present Is patient at risk for suicide?: Yes Suicidal Plan?: Yes-Currently Present Specify Current Suicidal Plan: Hang self What has been your use of drugs/alcohol within the last 12 months?: Marijuana, cocaine How many times?:  (unknown) Triggers for Past Attempts: Unknown Intentional Self Injurious Behavior: None Risk to Others: Homicidal Ideation: Yes-Currently Present Thoughts of Harm to Others:  Yes-Currently Present Comment - Thoughts of Harm to Others: Voices commanding him to harm self and others Current Homicidal Intent: Yes-Currently Present Current Homicidal Plan: No Access to Homicidal Means: No Identified  Victim: People at group home History of harm to others?: Yes Assessment of Violence: None Noted Violent Behavior Description: Pt stated that he murdered his grandfather Does patient have access to weapons?: No Criminal Charges Pending?: No Does patient have a court date: No Prior Inpatient Therapy: Prior Inpatient Therapy: Yes Prior Therapy Dates: Unknown Prior Therapy Facilty/Provider(s): Unknown Reason for Treatment: Schizoaffective Disorder Prior Outpatient Therapy: Prior Outpatient Therapy: Yes Prior Therapy Dates: Ongoing Prior Therapy Facilty/Provider(s): [facility] Reason for Treatment: Schizoaffective Does patient have an ACCT team?: Yes Does patient have Intensive In-House Services?  : No Does patient have [facility] services? : No Does patient have P4CC services?: No  Past Medical History:  Past Medical History:  Diagnosis Date  . Anemia    "Nacrocytic"  . Cancer [facility])    colon cancer  . Colostomy in place [facility])   . Drug abuse   . Edentulous   . GERD (gastroesophageal reflux disease)   . Hypertension   . MRSA (methicillin resistant staph aureus) culture positive   . Schizo affective schizophrenia [facility])     Past Surgical History:  Procedure Laterality Date  . arm surgery    . COLOSTOMY    . COLOSTOMY     Family History: History reviewed. No pertinent family history. Family Psychiatric  History: Unknown Social History:  History  Alcohol Use No     History  Drug Use  . Types: Cocaine, Marijuana    Comment: Pt denied current use    Social History   Social History  . Marital status: Single    Spouse name: N/A  . Number of children: N/A  . Years of education: N/A   Social History Main Topics  . Smoking status: Current Every Day Smoker    Packs/day: 0.50  . Smokeless tobacco: Never Used  . Alcohol use No  . Drug use:     Types: Cocaine, Marijuana     Comment: Pt denied current use  . Sexual activity: No   Other Topics Concern   . None   Social History Narrative  . None   Additional Social History:    Allergies:   Allergies  Allergen Reactions  . Penicillins Rash    Has patient had a PCN reaction causing immediate rash, facial/tongue/throat swelling, SOB or lightheadedness with hypotension: No Has patient had a PCN reaction causing severe rash involving mucus membranes or skin necrosis: No Has patient had a PCN reaction that required hospitalization No Has patient had a PCN reaction occurring within the last 10 years: No If all of the above answers are "NO", then may proceed with Cephalosporin use.     Labs:  No results found for this or any previous visit (from the past 48 hour(s)).  Current Facility-Administered Medications  Medication Dose Route Frequency Provider Last Rate Last Dose  . acetaminophen (TYLENOL) tablet 650 mg  650 mg Oral Q6H PRN Francine Graven, DO   650 mg at 06/17/16 0737  . amLODipine (NORVASC) tablet 10 mg  10 mg Oral QHS Francine Graven, DO   10 mg at 06/22/16 2210  . benztropine (COGENTIN) tablet 1 mg  1 mg Oral QHS Francine Graven, DO   1 mg at 06/22/16 2211  . carvedilol (COREG) tablet 25 mg  25 mg Oral BID WC Francine Graven,  DO   25 mg at 06/23/16 0757  . cloZAPine (CLOZARIL) tablet 150 mg  150 mg Oral Daily Francine Graven, DO   150 mg at 06/23/16 0954  . cloZAPine (CLOZARIL) tablet 350 mg  350 mg Oral QHS Francine Graven, DO   350 mg at 06/22/16 2210  . desmopressin (DDAVP) tablet 0.4 mg  0.4 mg Oral QHS Francine Graven, DO   0.4 mg at 06/22/16 2210  . folic acid (FOLVITE) tablet 2 mg  2 mg Oral q morning - 10a Francine Graven, DO   2 mg at 06/23/16 [id number]  . haloperidol (HALDOL) tablet 5 mg  5 mg Oral BID PRN Francine Graven, DO   5 mg at 06/19/16 0300  . haloperidol (HALDOL) tablet 5-10 mg  5-10 mg Oral TID Francine Graven, DO   10 mg at 06/23/16 0954  . ipratropium (ATROVENT) 0.03 % nasal spray 2 spray  2 spray Each Nare BID PRN Francine Graven, DO      .  sertraline (ZOLOFT) tablet 100 mg  100 mg Oral QHS Francine Graven, DO   100 mg at 06/22/16 2211  . traZODone (DESYREL) tablet 100 mg  100 mg Oral QHS PRN Francine Graven, DO   100 mg at 06/21/16 2121   Current Outpatient Prescriptions  Medication Sig Dispense Refill  . amLODipine (NORVASC) 10 MG tablet Take 1 tablet (10 mg total) by mouth at bedtime. 30 tablet 0  . benztropine (COGENTIN) 1 MG tablet Take 1 tablet (1 mg total) by mouth 2 (two) times daily. (Patient taking differently: Take 1 mg by mouth at bedtime. ) 10 tablet 0  . carvedilol (COREG) 25 MG tablet Take 1 tablet (25 mg total) by mouth 2 (two) times daily with a meal. 60 tablet 0  . cloZAPine (CLOZARIL) 100 MG tablet Take 150-350 mg by mouth 2 (two) times daily. 150mg  in the morning and take 350mg  at bedtime    . desmopressin (DDAVP) 0.2 MG tablet Take 2 tablets (0.4 mg total) by mouth at bedtime. 30 tablet 0  . folic acid (FOLVITE) 1 MG tablet Take 2 tablets (2 mg total) by mouth daily. (Patient taking differently: Take 2 mg by mouth every morning. ) 30 tablet 0  . haloperidol (HALDOL) 10 MG tablet Take 0.5-1 tablets (5-10 mg total) by mouth 3 (three) times daily. Take 5mg  in the morning, take 10mg  by mouth at 2:00pm, and at bedtime. 30 tablet 0  . haloperidol (HALDOL) 5 MG tablet Take 5 mg by mouth 2 (two) times daily as needed for agitation (Also for aggressive impulses and voices).    Marland Kitchen ipratropium (ATROVENT) 0.03 % nasal spray Place 2 sprays into both nostrils every 12 (twelve) hours. (Patient taking differently: Place 2 sprays into both nostrils 2 (two) times daily as needed (for drooling). ) 30 mL 12  . sertraline (ZOLOFT) 100 MG tablet Take 1 tablet (100 mg total) by mouth at bedtime. 30 tablet 0  . traZODone (DESYREL) 100 MG tablet Take 100 mg by mouth at bedtime as needed for sleep.    Marland Kitchen acetaminophen (TYLENOL) 325 MG tablet Take 2 tablets (650 mg total) by mouth every 6 (six) hours as needed for mild pain (or Fever >/=  101).      Musculoskeletal: Unable to assess: camera  Psychiatric Specialty Exam: Physical Exam  Review of Systems  Psychiatric/Behavioral: Positive for depression. Negative for hallucinations (denies today) and suicidal ideas. The patient is not nervous/anxious and does not have insomnia.  Blood pressure 157/88, pulse 103, temperature 97.6 F (36.4 C), temperature source Oral, resp. rate 17, height 5\' 11"  (1.803 m), weight 75.3 kg (166 lb), SpO2 97 %.Body mass index is 23.15 kg/m.  General Appearance: Casual and Fairly Groomed  Eye Contact:  Good  Speech:  Clear and Coherent and Normal Rate  Volume:  Normal  Mood:  Depressed  Affect:  Congruent and Depressed  Thought Process:  Coherent  Orientation:  Full (Time, Place, and Person)  Thought Content:  Focused on going home and seeing his outpatient providers   Suicidal Thoughts:  Denies and contracts for safety  Homicidal Thoughts:  No  Memory:  Immediate;   Fair Recent;   Fair Remote;   Fair  Judgement:  Fair  Insight:  Fair  Psychomotor Activity:  Increased  Concentration:  Concentration: Good and Attention Span: Good  Recall:  Good  Fund of Knowledge:  Fair  Language:  Good  Akathisia:  No  Handed:  Right  AIMS (if indicated):     Assets:  Agricultural consultant Leisure Time Resilience Social Support  ADL's:  Intact  Cognition:  WNL  Sleep:       Treatment Plan Summary: Schizoaffective disorder, bipolar type ([place]) improving, stable for outpatient management  Discharge Pt home to group home Pt to continue taking antipsychotics as prescribed by his provider  Disposition: No evidence of imminent risk to self or others at present.   Patient does not meet criteria for psychiatric inpatient admission.  Benjamine Mola, FNP 06/23/2016 11:26 AM   Agree with NP assessment as above

## 2016-06-23 NOTE — ED Notes (Signed)
Pt sleeping at this time.  Calm and cooperative.

## 2016-06-23 NOTE — ED Notes (Signed)
Meal given with pos

## 2016-06-23 NOTE — ED Notes (Signed)
Spoke with Jeronimo Greaves, administrator at group home, who states pt does not have a guardian and makes his own decisions.

## 2016-06-23 NOTE — ED Notes (Signed)
Pt resting quietly with his eyes closed-His breathing is even and unlabored.  Unable to assess at this time shift psych assessment

## 2016-06-23 NOTE — Clinical Social Work Note (Addendum)
CSW received call from ED that facility is refusing to accept pt back. CSW spoke with Jeronimo Greaves, administrator at [facility] who states that pt was under IVC and needs treatment. CSW explained that he was under IVC, but this has been rescinded and he is psychiatrically cleared. John said that he still refuses to take pt back and is aware of plan to notify state. Pt was not given 30 day notice. CSW made APS report to Foye Spurling at [facility] and also to [facility] complaint line. DSS followed up with CSW and said that they had also spoken with Jeronimo Greaves and he is continuing to refuse pt. New placement will need to be started. CSW called [facility] who report that pt's care coordinator recently closed case. His ACT team is [facility] [phone]). CSW has left voicemail to discuss. CSW spoke with supervisor regarding situation. RN to follow up with psych regarding capacity.    Update: CSW spoke with Eligha Bridegroom, Team Lead with [facility]. She states she will call several facilities this weekend to start process and if needed can be reached at crisis line listed above.   Benay Pike, [place]

## 2016-06-23 NOTE — ED Notes (Signed)
Resting quietly eyes closed with even, unlabored respirations

## 2016-06-24 ENCOUNTER — Encounter (HOSPITAL_COMMUNITY): Payer: Self-pay | Admitting: Hematology & Oncology

## 2016-06-24 DIAGNOSIS — F259 Schizoaffective disorder, unspecified: Secondary | ICD-10-CM | POA: Diagnosis not present

## 2016-06-24 MED ORDER — SALINE SPRAY 0.65 % NA SOLN
1.0000 | NASAL | Status: DC | PRN
  Filled 2016-06-24: qty 44

## 2016-06-24 MED ORDER — IBUPROFEN 400 MG PO TABS
600.0000 mg | ORAL_TABLET | Freq: Once | ORAL | Status: AC
  Administered 2016-06-24: 600 mg via ORAL
  Filled 2016-06-24: qty 2

## 2016-06-24 MED ORDER — SALINE SPRAY 0.65 % NA SOLN
NASAL | Status: AC
  Filled 2016-06-24: qty 44

## 2016-06-24 NOTE — ED Notes (Signed)
Resting with eyes closed and even, unlabored respirations 

## 2016-06-24 NOTE — ED Notes (Signed)
Colostomy bag leaking. Bag emptied and removed. New colostomy bag placed. Patient tolerated well and cooperative with staff.

## 2016-06-24 NOTE — ED Notes (Signed)
Breakfast tray given to patient.

## 2016-06-24 NOTE — ED Notes (Signed)
Pt from another room into pt's room - pt awakened and was reassured and now is trying to return to sleep

## 2016-06-24 NOTE — ED Notes (Signed)
Patient resting this afternoon. Woke patient to medicate with evening meds. Vital signs obtained and stable. Patient c/o right elbow pain. Ibuprofen given per orders and patient preference. Patient given warm blankets.

## 2016-06-24 NOTE — ED Notes (Signed)
Dinner tray given to patient

## 2016-06-24 NOTE — ED Notes (Addendum)
Resting quietly, eyes closed with even, unlabored respirations

## 2016-06-24 NOTE — ED Notes (Addendum)
Patient medicated without difficulty. Patient given orange juice per request. Allowed to call mother per request.

## 2016-06-25 DIAGNOSIS — F259 Schizoaffective disorder, unspecified: Secondary | ICD-10-CM | POA: Diagnosis not present

## 2016-06-25 NOTE — ED Notes (Signed)
Pt resting calmly w/ eyes closed. Non labored respirations. NAD noted.

## 2016-06-25 NOTE — ED Notes (Signed)
Advised by [facility] telesitter cannot be used in ED.

## 2016-06-25 NOTE — ED Notes (Signed)
Pt found pulling things off of the wall.  Telesitter implemented to prevent pulling of colostomy bag off and destruction of equipment.

## 2016-06-25 NOTE — ED Notes (Signed)
Pt's colostomy bag changed as it had become unattached.  Pt remains cooperative.

## 2016-06-25 NOTE — ED Notes (Addendum)
When this nurse arrived for shift patient had been in restroom for a while. Patient was frustrated stating he could not put his pants on by himself. Patient has been seen multiple times dressing himself. Patient called out wanting someone to put his pants on for him.  Patient refused to come out of restroom.  When this nurse came on shift and checked on patient in bathroom at 0900 patient had removed his ostomy bag.  Ostomy had been leaking for an hour. Patient had spread feces on all surfaces of restroom stating that he didn't know what to do.  Patient was cleaned and new ostomy bag was put in place. Patient was instructed to not remove ostomy bag.

## 2016-06-25 NOTE — ED Notes (Signed)
Pt woke & colostomy bag had raptured. Pt carried to showers & bath was taken. New bag placed on pt. Pt placed in clean scrubs, returned to room & warm blankets provided.

## 2016-06-25 NOTE — ED Notes (Signed)
Pt provided with lunch tray.  Is cooperative at this time.  States he wants someone to talk to.

## 2016-06-26 DIAGNOSIS — F259 Schizoaffective disorder, unspecified: Secondary | ICD-10-CM | POA: Diagnosis not present

## 2016-06-26 NOTE — ED Notes (Signed)
Meal given to pt.

## 2016-06-26 NOTE — ED Notes (Signed)
Emptied colostomy bag 

## 2016-06-26 NOTE — ED Notes (Addendum)
Pt woke up with stool all over him his colostomy had came off it was replaced  Pt was given a bath and put in new scrubs.

## 2016-06-27 DIAGNOSIS — F259 Schizoaffective disorder, unspecified: Secondary | ICD-10-CM | POA: Diagnosis not present

## 2016-06-27 NOTE — Clinical Social Work Note (Signed)
CSW spoke with [facility] team supervisor, Eligha Bridegroom, [phone])  who advised that she not been able to locate placement for patient. She advised that it was her understanding that patient was going to [facility]. CSW advised that patient was cleared by psych and would not be going to [facility]. CSW discussed that patient needed placement immediately.  Ms. Jimmye Norman advised that her housing specialist needed returned today and would begin working on it.         Arkeem Harts, Clydene Pugh, LCSW

## 2016-06-27 NOTE — ED Notes (Signed)
Pt walking in hallway, Security redirecting to room.  Pt wanting to leave

## 2016-06-27 NOTE — ED Notes (Signed)
Pt asking for clothes wants to leave,  Charge checked with [facility], was told to give patient clothes, Back in the room pt is in bed with blankets. Pt was told it is cold outside. Pt states "I will do what you tell me to do, if you want me to stay I will stay"

## 2016-06-27 NOTE — ED Notes (Signed)
Pt went to bathroom, filled the commode with all the paper.

## 2016-06-27 NOTE — ED Notes (Signed)
Spoke with Alysha from the Act team today and she says that pt is very difficult to place and that she will talk to her housing specialist but that pt would not be leaving today.

## 2016-06-27 NOTE — ED Notes (Signed)
Pt up in room clean room, changing bed, drinking ginger ale

## 2016-06-27 NOTE — ED Notes (Signed)
Colostomy bag changed

## 2016-06-27 NOTE — ED Notes (Signed)
Pt eating supper, no complaints

## 2016-06-28 DIAGNOSIS — F259 Schizoaffective disorder, unspecified: Secondary | ICD-10-CM | POA: Diagnosis not present

## 2016-06-28 NOTE — ED Notes (Signed)
Pt coming in and out of his room to throw handfuls of soap into the trash can in the hallway. This tech went into room and gave pt coloring pages and crayons and turned on something calming on the tv. Pt calm and cooperative at this time.

## 2016-06-28 NOTE — Clinical Social Work Note (Signed)
CSW spoke with Nira Retort, [place] housing specialist. He stated that he is working actively on locating another placement for patient. He stated that patient is difficult to place due to his history of behaviors as well as his colostomy bag needs.  CSW discussed the need to increase search to outside [facility] due to the limited availability of resources.     Nare Gaspari, Clydene Pugh, LCSW

## 2016-06-28 NOTE — ED Notes (Signed)
WEE bag removed once colostomy items arrived. Pt placed in clean scrubs &  New bag placed.

## 2016-06-28 NOTE — ED Notes (Signed)
Tech advised this nurse pt says he is hearing voices & wants to talk to someone. In to see pt at this time.

## 2016-06-28 NOTE — ED Notes (Addendum)
Pt removed colostomy bag says it came off & placed it drawer. No other supplies in room. Called AC for another bag.

## 2016-06-28 NOTE — ED Notes (Signed)
Pt given salad and sprite zero

## 2016-06-28 NOTE — ED Notes (Signed)
Pt stated he really just wanted something to eat. Drink, crackers & peanut butter provided. Pt had some cereal left in his room that he was reminded of also. Pt informed no more snacks during the night. Lights turned down & warm blanket provided.

## 2016-06-28 NOTE — ED Notes (Signed)
Pt given electric razor under supervision of staff to shave per request.

## 2016-06-28 NOTE — ED Notes (Signed)
Pt changed into clean scrubs & bed liniens changed. WEE bag placed over stoma until [facility] brings colostomy supplies.

## 2016-06-29 DIAGNOSIS — F259 Schizoaffective disorder, unspecified: Secondary | ICD-10-CM | POA: Diagnosis not present

## 2016-06-29 NOTE — ED Notes (Signed)
Meal given

## 2016-06-29 NOTE — ED Notes (Signed)
Colostomy bag emptied.

## 2016-06-29 NOTE — ED Notes (Signed)
Pt given meal tray.

## 2016-06-29 NOTE — ED Notes (Signed)
Emptied colostomy bag 

## 2016-06-29 NOTE — ED Notes (Signed)
Pt convinced to come back to room, given paper and crayons.  Pt walking around nurses station at this time.

## 2016-06-29 NOTE — Clinical Social Work Note (Signed)
CSW spoke with ED RN Magda Paganini who reports pt is wanting to leave. Pt has since been redirected back to room. CSW spoke with Delrae Alfred, housing specialist with ACTT team. He states he has a facility considering pt and is sending them additional information. CSW requested that ACTT team member visit pt as he is familiar with them and to reassure him they are working on placement. Delrae Alfred states that they are short staffed today and may not be able to come. ED updated.  Benay Pike, [place]

## 2016-06-29 NOTE — ED Notes (Signed)
Pt speaking to Delrae Alfred with ACT team at this time.

## 2016-06-29 NOTE — ED Notes (Signed)
Pt wanting to leave at this time, is encouraged to stay due to cold weather and having no place to go.  Pt told that weather is under freezing temperatures and it may not be safe to leave due to not having shelter.  Charge RN gathering belongings for pt at this time.

## 2016-06-29 NOTE — ED Notes (Addendum)
Pt in bathroom at this time, slamming doors, filling up multiple urine cups with urine, and telling people to leave him alone, security called.

## 2016-06-29 NOTE — ED Notes (Signed)
Meal given to pt.

## 2016-06-29 NOTE — ED Notes (Signed)
Pt given pack of nabs (crackers) and a sprite. Pt was informed that this was the last snack/drink before bed.

## 2016-06-29 NOTE — ED Notes (Addendum)
Pt walking laps around nurses station.

## 2016-06-29 NOTE — Clinical Social Work Note (Signed)
CSW spoke with Maryjane Hurter with the ACTT team.  He said that he contacted 4-5 places, completed a FL2 and is awaiting return contact from some of those. He stated that he is looking in [place] and [place]. He advised that he will keep facility staff posted on progress.     Yamaris Cummings, Clydene Pugh, LCSW

## 2016-06-29 NOTE — ED Notes (Signed)
Woke pt to empty colostomy bag. Pt had urinated all over self and bed linens while sleeping. All bed linens changed and pt washed off and given clean scrubs to wear.

## 2016-06-29 NOTE — ED Notes (Signed)
[employer] officer, suggested calling Charolette Forward, With dss, [facility].  Notified GPaulla Dolly, ac and was told ok to discuss case.  Spoke with Charolette Forward and she will follow up with DSS and Act team tomorrow.

## 2016-06-30 DIAGNOSIS — F259 Schizoaffective disorder, unspecified: Secondary | ICD-10-CM | POA: Diagnosis not present

## 2016-06-30 MED ORDER — TUBERCULIN PPD 5 UNIT/0.1ML ID SOLN
5.0000 [IU] | Freq: Once | INTRADERMAL | Status: DC
  Administered 2016-06-30: 5 [IU] via INTRADERMAL
  Filled 2016-06-30: qty 0.1

## 2016-06-30 NOTE — ED Notes (Addendum)
Pt called out for nurse- upon entering room pt requested his colostomy bag and something to eat.  The colostomy bag was not full, just had excess air that needed to be released- air released by this nurse. Informed pt that breakfast would be here at 7 am (in one hour) and he needed to be quiet and rest until then as other patients were sleeping. Pt verbalized understanding and agreed to this.

## 2016-06-30 NOTE — Discharge Instructions (Signed)
PPD was placed today on the left forearm.  This will need to be rechecked in 48 hours.

## 2016-06-30 NOTE — ED Notes (Signed)
Pt sleeping at this time.

## 2016-06-30 NOTE — ED Provider Notes (Signed)
Patient is ambulatory without gross neurological deficits. PPD placed on left forearm.   Nat Christen, MD 06/30/16 [phone]

## 2016-06-30 NOTE — Clinical Social Work Note (Addendum)
CSW spoke with Maryjane Hurter, [place] housing specialist. He advised that he had located placement for patient. He stated that he will retrieve patient's belonging from the previous facility and pick patient up around lunch. He requested that patient not be advised of this move until he was there to pick him up as it would create anxiety for him.  CSW completed FL2.  CSW signing off.    Osiel Stick, Clydene Pugh, LCSW

## 2016-06-30 NOTE — ED Notes (Signed)
Transferring to [facility].  Mario Ortega from [facility]T here to transport.

## 2016-06-30 NOTE — NC FL2 (Signed)
[place] LEVEL OF CARE SCREENING TOOL     IDENTIFICATION  Patient Name: Mario Ortega Birthdate: [birth year] Sex: male Admission Date (Current Location): 06/16/2016  [facility] and [place] Number:  [place] [id number] ([id number]) Facility and Address:  [facility] [address], [place]      Provider Number: [id number]  Attending Physician Name and Address:  Provider Default, MD  Relative Name and Phone Number:       Current Level of Care: Domiciliary (Rest home) (Patient is in the ED) Recommended Level of Care: Other (Comment) ([place]) Prior Approval Number:    Date Approved/Denied:   PASRR Number: [id number] ([id number])  Discharge Plan: Other (Comment) ([place])    Current Diagnoses: Patient Active Problem List   Diagnosis Date Noted  . Auditory hallucinations   . Homicidal ideation   . Suicidal ideation   . Anemia 05/30/2016  . Pill-induced gastritis 03/16/2016  . Upper abdominal pain   . Altered mental status 03/11/2016  . Thrombocytopenia ([place]) 03/09/2016  . AKI (acute kidney injury) ([place]) 03/09/2016  . CKD (chronic kidney disease), stage III 03/09/2016  . Lethargy 03/09/2016  . Dehydration 03/09/2016  . Tobacco use disorder 05/13/2015  . MRSA (methicillin resistant Staphylococcus aureus) 05/13/2015  . Schizophrenia ([place]) 05/12/2015  . Schizoaffective disorder, bipolar type ([place])   . HTN (hypertension) 05/09/2015  . GERD (gastroesophageal reflux disease) 05/09/2015  . GI bleed 04/07/2015    Orientation RESPIRATION BLADDER Height & Weight     Self, Situation, Place  Normal Incontinent Weight: 166 lb (75.3 kg) Height:  5\' 11"  (180.3 cm)  BEHAVIORAL SYMPTOMS/MOOD NEUROLOGICAL BOWEL NUTRITION STATUS      Colostomy Diet (Regular)  AMBULATORY STATUS COMMUNICATION OF NEEDS Skin   Independent Verbally Normal                       Personal Care Assistance Level of Assistance  Bathing,  Feeding, Dressing Bathing Assistance: Limited assistance Feeding assistance: Independent Dressing Assistance: Limited assistance     Functional Limitations Info  Sight, Hearing, Speech Sight Info: Adequate Hearing Info: Adequate Speech Info: Adequate    SPECIAL CARE FACTORS FREQUENCY                       Contractures Contractures Info: Present    Additional Factors Info  Code Status, Allergies, Psychotropic Code Status Info: Full Code Allergies Info: Penicillins Psychotropic Info: Haldol, Zoloft, Desyrel         Current Medications (06/30/2016):  This is the current hospital active medication list Current Facility-Administered Medications  Medication Dose Route Frequency Provider Last Rate Last Dose  . acetaminophen (TYLENOL) tablet 650 mg  650 mg Oral Q6H PRN Francine Graven, DO   650 mg at 06/27/16 0926  . amLODipine (NORVASC) tablet 10 mg  10 mg Oral QHS Francine Graven, DO   10 mg at 06/29/16 2247  . benztropine (COGENTIN) tablet 1 mg  1 mg Oral QHS Francine Graven, DO   1 mg at 06/29/16 2248  . carvedilol (COREG) tablet 25 mg  25 mg Oral BID WC Francine Graven, DO   25 mg at 06/30/16 0809  . cloZAPine (CLOZARIL) tablet 150 mg  150 mg Oral Daily Francine Graven, DO   150 mg at 06/30/16 0809  . cloZAPine (CLOZARIL) tablet 350 mg  350 mg Oral QHS Francine Graven, DO   350 mg at 06/29/16 2250  . desmopressin (DDAVP) tablet 0.4 mg  0.4 mg Oral QHS Francine Graven, DO   0.4 mg at 06/29/16 2250  . folic acid (FOLVITE) tablet 2 mg  2 mg Oral q morning - 10a Francine Graven, DO   2 mg at 06/30/16 0809  . haloperidol (HALDOL) tablet 5 mg  5 mg Oral BID PRN Francine Graven, DO   5 mg at 06/27/16 1650  . haloperidol (HALDOL) tablet 5-10 mg  5-10 mg Oral TID Francine Graven, DO   5 mg at 06/30/16 0809  . ipratropium (ATROVENT) 0.03 % nasal spray 2 spray  2 spray Each Nare BID PRN Francine Graven, DO      . sertraline (ZOLOFT) tablet 100 mg  100 mg Oral QHS Francine Graven, DO   100 mg at 06/29/16 2252  . sodium chloride (OCEAN) 0.65 % nasal spray 1 spray  1 spray Each Nare PRN Milton Ferguson, MD      . traZODone (DESYREL) tablet 100 mg  100 mg Oral QHS PRN Francine Graven, DO   100 mg at 06/27/16 2119   Current Outpatient Prescriptions  Medication Sig Dispense Refill  . amLODipine (NORVASC) 10 MG tablet Take 1 tablet (10 mg total) by mouth at bedtime. 30 tablet 0  . benztropine (COGENTIN) 1 MG tablet Take 1 tablet (1 mg total) by mouth 2 (two) times daily. (Patient taking differently: Take 1 mg by mouth at bedtime. ) 10 tablet 0  . carvedilol (COREG) 25 MG tablet Take 1 tablet (25 mg total) by mouth 2 (two) times daily with a meal. 60 tablet 0  . cloZAPine (CLOZARIL) 100 MG tablet Take 150-350 mg by mouth 2 (two) times daily. 150mg  in the morning and take 350mg  at bedtime    . desmopressin (DDAVP) 0.2 MG tablet Take 2 tablets (0.4 mg total) by mouth at bedtime. 30 tablet 0  . folic acid (FOLVITE) 1 MG tablet Take 2 tablets (2 mg total) by mouth daily. (Patient taking differently: Take 2 mg by mouth every morning. ) 30 tablet 0  . haloperidol (HALDOL) 10 MG tablet Take 0.5-1 tablets (5-10 mg total) by mouth 3 (three) times daily. Take 5mg  in the morning, take 10mg  by mouth at 2:00pm, and at bedtime. 30 tablet 0  . haloperidol (HALDOL) 5 MG tablet Take 5 mg by mouth 2 (two) times daily as needed for agitation (Also for aggressive impulses and voices).    Marland Kitchen ipratropium (ATROVENT) 0.03 % nasal spray Place 2 sprays into both nostrils every 12 (twelve) hours. (Patient taking differently: Place 2 sprays into both nostrils 2 (two) times daily as needed (for drooling). ) 30 mL 12  . sertraline (ZOLOFT) 100 MG tablet Take 1 tablet (100 mg total) by mouth at bedtime. 30 tablet 0  . traZODone (DESYREL) 100 MG tablet Take 100 mg by mouth at bedtime as needed for sleep.    Marland Kitchen acetaminophen (TYLENOL) 325 MG tablet Take 2 tablets (650 mg total) by mouth every 6 (six) hours as  needed for mild pain (or Fever >/= 101).       Discharge Medications: Please see discharge summary for a list of discharge medications.  Relevant Imaging Results:  Relevant Lab Results:   Additional Information SSN [id number] Clydene Pugh, LCSW

## 2016-06-30 NOTE — ED Notes (Signed)
Pt assisted with emptying colostomy bag, bed linens changed, pt cooperative during this, now pt is at sink washing hands and "cleaning up for breakfast".

## 2016-06-30 NOTE — ED Notes (Signed)
Report called to Isaias Sakai, RN at [phone].

## 2016-06-30 NOTE — ED Notes (Signed)
Pt refused VS  

## 2016-07-04 ENCOUNTER — Emergency Department
Admission: EM | Admit: 2016-07-04 | Discharge: 2016-07-22 | Disposition: A | Discharge status: Other Acute Inpatient Hospital | Payer: Medicare Other | Attending: Emergency Medicine | Admitting: Emergency Medicine

## 2016-07-04 DIAGNOSIS — R45850 Homicidal ideations: Secondary | ICD-10-CM | POA: Insufficient documentation

## 2016-07-04 DIAGNOSIS — F259 Schizoaffective disorder, unspecified: Secondary | ICD-10-CM | POA: Insufficient documentation

## 2016-07-04 DIAGNOSIS — I129 Hypertensive chronic kidney disease with stage 1 through stage 4 chronic kidney disease, or unspecified chronic kidney disease: Secondary | ICD-10-CM | POA: Diagnosis not present

## 2016-07-04 DIAGNOSIS — N183 Chronic kidney disease, stage 3 (moderate): Secondary | ICD-10-CM | POA: Insufficient documentation

## 2016-07-04 DIAGNOSIS — Z85038 Personal history of other malignant neoplasm of large intestine: Secondary | ICD-10-CM | POA: Insufficient documentation

## 2016-07-04 DIAGNOSIS — R45851 Suicidal ideations: Secondary | ICD-10-CM | POA: Diagnosis not present

## 2016-07-04 DIAGNOSIS — F17210 Nicotine dependence, cigarettes, uncomplicated: Secondary | ICD-10-CM | POA: Insufficient documentation

## 2016-07-04 DIAGNOSIS — F209 Schizophrenia, unspecified: Secondary | ICD-10-CM | POA: Diagnosis present

## 2016-07-04 DIAGNOSIS — F203 Undifferentiated schizophrenia: Secondary | ICD-10-CM

## 2016-07-04 DIAGNOSIS — R451 Restlessness and agitation: Secondary | ICD-10-CM

## 2016-07-04 DIAGNOSIS — Z933 Colostomy status: Secondary | ICD-10-CM | POA: Diagnosis not present

## 2016-07-04 DIAGNOSIS — F99 Mental disorder, not otherwise specified: Secondary | ICD-10-CM | POA: Diagnosis not present

## 2016-07-04 LAB — CBC WITH DIFFERENTIAL/PLATELET
BASOS ABS: 0 10*3/uL (ref 0–0.1)
BASOS PCT: 0 %
Eosinophils Absolute: 0.2 10*3/uL (ref 0–0.7)
Eosinophils Relative: 2 %
HCT: 30.9 % — ABNORMAL LOW (ref 40.0–52.0)
Hemoglobin: 10.4 g/dL — ABNORMAL LOW (ref 13.0–18.0)
LYMPHS PCT: 20 %
Lymphs Abs: 2.1 10*3/uL (ref 1.0–3.6)
MCH: 32.1 pg (ref 26.0–34.0)
MCHC: 33.6 g/dL (ref 32.0–36.0)
MCV: 95.5 fL (ref 80.0–100.0)
MONO ABS: 1 10*3/uL (ref 0.2–1.0)
Monocytes Relative: 9 %
Neutro Abs: 7.2 10*3/uL — ABNORMAL HIGH (ref 1.4–6.5)
Neutrophils Relative %: 69 %
PLATELETS: 312 10*3/uL (ref 150–440)
RBC: 3.23 MIL/uL — ABNORMAL LOW (ref 4.40–5.90)
RDW: 15.4 % — ABNORMAL HIGH (ref 11.5–14.5)
WBC: 10.5 10*3/uL (ref 3.8–10.6)

## 2016-07-04 LAB — COMPREHENSIVE METABOLIC PANEL
ALT: 20 U/L (ref 17–63)
ANION GAP: 7 (ref 5–15)
AST: 23 U/L (ref 15–41)
Albumin: 3.7 g/dL (ref 3.5–5.0)
Alkaline Phosphatase: 82 U/L (ref 38–126)
BUN: 10 mg/dL (ref 6–20)
CHLORIDE: 108 mmol/L (ref 101–111)
CO2: 24 mmol/L (ref 22–32)
Calcium: 9.8 mg/dL (ref 8.9–10.3)
Creatinine, Ser: 1.12 mg/dL (ref 0.61–1.24)
GFR calc Af Amer: >60 mL/min (ref >60)
Glucose, Bld: 97 mg/dL (ref 65–99)
POTASSIUM: 3.5 mmol/L (ref 3.5–5.1)
Sodium: 139 mmol/L (ref 135–145)
Total Bilirubin: 0.9 mg/dL (ref 0.3–1.2)
Total Protein: 6.7 g/dL (ref 6.5–8.1)

## 2016-07-04 LAB — ETHANOL

## 2016-07-04 MED ORDER — SERTRALINE HCL 100 MG PO TABS
100.0000 mg | ORAL_TABLET | Freq: Every day | ORAL | Status: DC
  Administered 2016-07-04 – 2016-07-10 (×7): 100 mg via ORAL
  Filled 2016-07-04 (×7): qty 1

## 2016-07-04 MED ORDER — DESMOPRESSIN ACETATE 0.2 MG PO TABS
0.4000 mg | ORAL_TABLET | Freq: Every day | ORAL | Status: DC
  Administered 2016-07-04 – 2016-07-21 (×18): 0.4 mg via ORAL
  Filled 2016-07-04 (×19): qty 2

## 2016-07-04 MED ORDER — DIAZEPAM 5 MG PO TABS
10.0000 mg | ORAL_TABLET | Freq: Once | ORAL | Status: AC
  Administered 2016-07-04: 10 mg via ORAL
  Filled 2016-07-04: qty 2

## 2016-07-04 MED ORDER — CLOZAPINE 100 MG PO TABS
150.0000 mg | ORAL_TABLET | Freq: Every day | ORAL | Status: DC
  Administered 2016-07-04 – 2016-07-22 (×18): 150 mg via ORAL
  Filled 2016-07-04 (×7): qty 2
  Filled 2016-07-04: qty 6
  Filled 2016-07-04 (×4): qty 2
  Filled 2016-07-04: qty 3
  Filled 2016-07-04: qty 6
  Filled 2016-07-04 (×6): qty 2

## 2016-07-04 MED ORDER — AMLODIPINE BESYLATE 5 MG PO TABS
10.0000 mg | ORAL_TABLET | Freq: Every day | ORAL | Status: DC
  Administered 2016-07-04 – 2016-07-22 (×18): 10 mg via ORAL
  Filled 2016-07-04 (×15): qty 2
  Filled 2016-07-04: qty 1
  Filled 2016-07-04 (×4): qty 2

## 2016-07-04 MED ORDER — FOLIC ACID 1 MG PO TABS
2.0000 mg | ORAL_TABLET | Freq: Every day | ORAL | Status: DC
  Administered 2016-07-04 – 2016-07-22 (×18): 2 mg via ORAL
  Filled 2016-07-04 (×18): qty 2

## 2016-07-04 MED ORDER — CLOZAPINE 25 MG PO TABS
350.0000 mg | ORAL_TABLET | Freq: Every day | ORAL | Status: DC
  Administered 2016-07-04 – 2016-07-21 (×18): 350 mg via ORAL
  Filled 2016-07-04 (×27): qty 2

## 2016-07-04 MED ORDER — BENZTROPINE MESYLATE 0.5 MG PO TABS
1.0000 mg | ORAL_TABLET | Freq: Every day | ORAL | Status: DC
  Administered 2016-07-04 – 2016-07-21 (×18): 1 mg via ORAL
  Filled 2016-07-04 (×17): qty 2

## 2016-07-04 MED ORDER — CARVEDILOL 25 MG PO TABS
25.0000 mg | ORAL_TABLET | Freq: Two times a day (BID) | ORAL | Status: DC
  Administered 2016-07-05 – 2016-07-22 (×31): 25 mg via ORAL
  Filled 2016-07-04 (×33): qty 1

## 2016-07-04 MED ORDER — HALOPERIDOL 5 MG PO TABS
5.0000 mg | ORAL_TABLET | Freq: Four times a day (QID) | ORAL | Status: DC | PRN
  Administered 2016-07-05 – 2016-07-22 (×32): 5 mg via ORAL
  Filled 2016-07-04 (×31): qty 1

## 2016-07-04 MED ORDER — TRAZODONE HCL 100 MG PO TABS
100.0000 mg | ORAL_TABLET | Freq: Every day | ORAL | Status: DC
  Administered 2016-07-04 – 2016-07-21 (×18): 100 mg via ORAL
  Filled 2016-07-04 (×18): qty 1

## 2016-07-04 NOTE — ED Notes (Signed)
Patient wet bed trying to get up, patient cleaned up and changed patient refused to put underwear on.

## 2016-07-04 NOTE — ED Notes (Signed)
Pt clothing returned from [place] services after being washed and dried due to feces on clothing.

## 2016-07-04 NOTE — BH Assessment (Signed)
Tele Assessment Note   Mario Ortega is an 51 y.o. male presenting to [facility] under involuntary commitment. He presented in hospital scrubs, was initially flirtatious and inappropriate, had tangential thought, word salad at times, poor judgement and insight. Admits he "freaked out" at the group home.  Reports SI, HI and auditory hallucinations, to kill others. "I want to kill those that destroyed me." He never reported a plan or identified a specific person, even when asked a direct question about intent or plan.  Attempted to call Isaias Sakai, [phone] owner of [facility] but she could not be reached.  Spoke with Jari Pigg, [phone] staff work, with the group home. She reports the patient has only been at there facility for 3 days, arriving Friday night. Recently discharged from [facility].  Patient was fairly pleasant on Friday but quickly became agitated. By Sunday the patient was walking naked into the street and laying down on the road.   Patient would not respond to redirection  when asked to put his clothes on. Patient did not respond to the threat of police action for indecent exposure saying, " Let them come."  Patient verbally stated to staff worker [name] he was hearing voices telling him to kill himself and others, "talking nonsense', take off colostomy bag, throwing feces, refused to shower, banging on the walls at night, pulled shower rack off the wall, took things out of residence drawers, thought his roommate was laughing at him when roommate was asleep. Patient continued to take his clothes off today and lay in the road. ACT team was called and patient IVC'd.  When ACT team member arrive he reportedly stated to her that he was suicidal and homicidal , had a plan to choke them. [phone]. This statement was not in the IVC paperwork. The paperwork suggest he was having flashbacks which produced his aggressive behavior. The patient did make a statement  about flashbacks to this clinician but did not elaborate.   Dr. Weber Cooks to evaluate and disposition.   Diagnosis: Schizoaffective Disorder  Past Medical History:  Past Medical History:  Diagnosis Date  . Anemia    "Nacrocytic"  . Cancer [facility])    colon cancer  . Colostomy in place [facility])   . Drug abuse   . Edentulous   . GERD (gastroesophageal reflux disease)   . Hypertension   . MRSA (methicillin resistant staph aureus) culture positive   . Schizo affective schizophrenia [facility])     Past Surgical History:  Procedure Laterality Date  . arm surgery    . COLOSTOMY    . COLOSTOMY      Family History: No family history on file.  Social History:  reports that he has been smoking.  He has been smoking about 0.50 packs per day. He has never used smokeless tobacco. He reports that he uses drugs, including Cocaine and Marijuana. He reports that he does not drink alcohol.  Additional Social History:     CIWA: CIWA-Ar BP: (!) 149/98 Pulse Rate: 83 COWS:    PATIENT STRENGTHS: (choose at least two) Average or above average intelligence General fund of knowledge  Allergies:  Allergies  Allergen Reactions  . Penicillins Rash    Has patient had a PCN reaction causing immediate rash, facial/tongue/throat swelling, SOB or lightheadedness with hypotension: No Has patient had a PCN reaction causing severe rash involving mucus membranes or skin necrosis: No Has patient had a PCN reaction that required hospitalization No Has patient had a PCN reaction  occurring within the last 10 years: No If all of the above answers are "NO", then may proceed with Cephalosporin use.     Home Medications:  (Not in a hospital admission)  OB/GYN Status:  No LMP for male patient.  General Assessment Data Location of Assessment: [facility] TTS Assessment: In system Is this a Tele or Face-to-Face Assessment?: Face-to-Face Is this an Initial Assessment or a Re-assessment for this encounter?: Initial  Assessment Marital status: Single Is patient pregnant?: No Pregnancy Status: No Living Arrangements: Group Home Can pt return to current living arrangement?: Yes Admission Status: Involuntary Is patient capable of signing voluntary admission?: No Referral Source: Other Insurance type: MCR  Medical Screening Exam ([place]) Medical Exam completed: Yes  Crisis Care Plan Living Arrangements: Group Home Name of Psychiatrist: [facility] (per nursing note) Name of Therapist: [facility] (per nursing note)  Education Status Is patient currently in school?: No  Risk to self with the past 6 months Suicidal Ideation: Yes-Currently Present Has patient been a risk to self within the past 6 months prior to admission? : Yes Suicidal Intent: No Has patient had any suicidal intent within the past 6 months prior to admission? : Yes Is patient at risk for suicide?: No Suicidal Plan?: No Has patient had any suicidal plan within the past 6 months prior to admission? : Yes Specify Current Suicidal Plan: no plan specified Access to Means: No What has been your use of drugs/alcohol within the last 12 months?: cannabis and cocaine Previous Attempts/Gestures: Yes Triggers for Past Attempts: Unknown Intentional Self Injurious Behavior: None Family Suicide History: Unknown Recent stressful life event(s): Conflict (Comment) Persecutory voices/beliefs?: Yes Depression: Yes Depression Symptoms: Feeling angry/irritable, Despondent Substance abuse history and/or treatment for substance abuse?: Yes Suicide prevention information given to non-admitted patients: Not applicable  Risk to Others within the past 6 months Homicidal Ideation: Yes-Currently Present Does patient have any lifetime risk of violence toward others beyond the six months prior to admission? : Unknown Thoughts of Harm to Others: Yes-Currently Present Comment - Thoughts of Harm to Others: voices  telling him to harm others Current Homicidal Intent: Yes-Currently Present Current Homicidal Plan: No Access to Homicidal Means: No Identified Victim: people at the group home,  History of harm to others?: Yes Assessment of Violence: On admission Violent Behavior Description: reports murdering his Grandfather, hurt group home staff Does patient have access to weapons?: No Criminal Charges Pending?: No Does patient have a court date: No Is patient on probation?: No  Psychosis Hallucinations: Auditory Delusions: Unspecified  Mental Status Report Appearance/Hygiene: Unremarkable Eye Contact: Fair Motor Activity: Freedom of movement Speech: Argumentative, Tangential, Aggressive Level of Consciousness: Irritable Mood: Angry, Preoccupied Affect: Angry Anxiety Level: None Thought Processes: Tangential Judgement: Impaired Orientation: Person, Place, Time, Situation Obsessive Compulsive Thoughts/Behaviors: None  Cognitive Functioning Concentration: Fair Memory: Remote Intact, Recent Intact IQ: Average Insight: Poor Impulse Control: Poor Appetite: Fair Sleep: No Change Vegetative Symptoms: None  ADLScreening [facility] Assessment Services) Patient's cognitive ability adequate to safely complete daily activities?: Yes Patient able to express need for assistance with ADLs?: Yes Independently performs ADLs?: Yes (appropriate for developmental age)  Prior Inpatient Therapy Prior Inpatient Therapy: Yes Prior Therapy Dates: Unknown Prior Therapy Facilty/Provider(s): [facility] Reason for Treatment: Schizoaffective Disorder  Prior Outpatient Therapy Prior Outpatient Therapy: Yes Prior Therapy Dates: Ongoing Prior Therapy Facilty/Provider(s): [facility] Reason for Treatment: Schizoaffective Does patient have an ACCT team?: Yes Does patient have Intensive In-House Services?  : No Does  patient have [facility] services? : No Does patient have P4CC services?: No  ADL  Screening (condition at time of admission) Patient's cognitive ability adequate to safely complete daily activities?: Yes Is the patient deaf or have difficulty hearing?: No Does the patient have difficulty seeing, even when wearing glasses/contacts?: No Does the patient have difficulty concentrating, remembering, or making decisions?: Yes Patient able to express need for assistance with ADLs?: Yes Does the patient have difficulty dressing or bathing?: No Independently performs ADLs?: Yes (appropriate for developmental age)       Abuse/Neglect Assessment (Assessment to be complete while patient is alone) Physical Abuse: Denies Verbal Abuse: Denies Sexual Abuse: Denies     Regulatory affairs officer (For Healthcare) Does Patient Have a Medical Advance Directive?: No    Additional Information 1:1 In Past 12 Months?: Yes CIRT Risk: Yes Elopement Risk: Yes Does patient have medical clearance?: Yes     Disposition:  Disposition Initial Assessment Completed for this Encounter: Yes Disposition of Patient: Other dispositions (to be seen by Dr. Weber Cooks) Other disposition(s): Other (Comment) (disposition to be determined)  Mollie Germany 07/04/2016 1:48 PM

## 2016-07-04 NOTE — ED Provider Notes (Signed)
[facility] Emergency Department Provider Note        Time seen: ----------------------------------------- 12:31 PM on 07/04/2016 -----------------------------------------    I have reviewed the triage vital signs and the nursing notes.   HISTORY  Chief Complaint Agitation    HPI Mario Ortega is a 51 y.o. male who presents to the ER being brought here under involuntary commitment. Patient reports he got mad at the staff at the group home he lives at a tick off his colostomy bag and threw her around the facility. He was covered in feces upon arrival smelling strongly of urine. He denies suicidal or homicidal ideation.   Past Medical History:  Diagnosis Date  . Anemia    "Nacrocytic"  . Cancer [facility])    colon cancer  . Colostomy in place [facility])   . Drug abuse   . Edentulous   . GERD (gastroesophageal reflux disease)   . Hypertension   . MRSA (methicillin resistant staph aureus) culture positive   . Schizo affective schizophrenia [facility])     Patient Active Problem List   Diagnosis Date Noted  . Auditory hallucinations   . Homicidal ideation   . Suicidal ideation   . Anemia 05/30/2016  . Pill-induced gastritis 03/16/2016  . Upper abdominal pain   . Altered mental status 03/11/2016  . Thrombocytopenia ([place]) 03/09/2016  . AKI (acute kidney injury) ([place]) 03/09/2016  . CKD (chronic kidney disease), stage III 03/09/2016  . Lethargy 03/09/2016  . Dehydration 03/09/2016  . Tobacco use disorder 05/13/2015  . MRSA (methicillin resistant Staphylococcus aureus) 05/13/2015  . Schizophrenia ([place]) 05/12/2015  . Schizoaffective disorder, bipolar type ([place])   . HTN (hypertension) 05/09/2015  . GERD (gastroesophageal reflux disease) 05/09/2015  . GI bleed 04/07/2015    Past Surgical History:  Procedure Laterality Date  . arm surgery    . COLOSTOMY    . COLOSTOMY      Allergies Penicillins  Social History Social History  Substance Use Topics   . Smoking status: Current Every Day Smoker    Packs/day: 0.50  . Smokeless tobacco: Never Used  . Alcohol use No    Review of Systems Constitutional: Negative for fever. Cardiovascular: Negative for chest pain. Respiratory: Negative for shortness of breath. Gastrointestinal: Negative for abdominal pain, vomiting and diarrhea. Skin: Negative for rash. Neurological: Negative for headaches, focal weakness or numbness. Psychiatric: Patient denies suicidal or homicidal ideation  10-point ROS otherwise negative.  ____________________________________________   PHYSICAL EXAM:  VITAL SIGNS: ED Triage Vitals  Enc Vitals Group     BP      Pulse      Resp      Temp      Temp src      SpO2      Weight      Height      Head Circumference      Peak Flow      Pain Score      Pain Loc      Pain Edu?      Excl. in [place]?     Constitutional: Alert and oriented. Well appearing and in no distress. Eyes: Conjunctivae are normal. Normal extraocular movements. ENT   Head: Normocephalic and atraumatic.   Nose: No congestion/rhinnorhea.   Mouth/Throat: Mucous membranes are moist.   Neck: No stridor. Cardiovascular: Normal rate, regular rhythm. No murmurs, rubs, or gallops. Respiratory: Normal respiratory effort without tachypnea nor retractions. Breath sounds are clear and equal bilaterally. No wheezes/rales/rhonchi. Musculoskeletal: Nontender with  normal range of motion in all extremities. No lower extremity tenderness nor edema. Neurologic:  Normal speech and language. No gross focal neurologic deficits are appreciated.  Skin:  Skin is warm, dry and intact. No rash noted. Psychiatric: Mood and affect are normal. Speech and behavior are normal.  ____________________________________________  ED COURSE:  Pertinent labs & imaging results that were available during my care of the patient were reviewed by me and considered in my medical decision making (see chart for  details). Clinical Course   Patient is in no acute distress, we will have patient assessed by psychiatry. He is medically stable at this time.  Procedures  ____________________________________________  FINAL ASSESSMENT AND PLAN  Agitation  Plan: Patient with labs as dictated above. Patient is in no acute distress and is stable for psychiatric evaluation and disposition.   Earleen Newport, MD   Note: This dictation was prepared with Dragon dictation. Any transcriptional errors that result from this process are unintentional    Earleen Newport, MD 07/04/16 1239

## 2016-07-04 NOTE — Progress Notes (Signed)
Referral information for Psychiatric Hospitalization faxed to;     Rosana Hoes [phone]),   Mikel Cella [phone] or [phone]),   [facility] [phone]),   Sharlene Motts [phone]  Autumn Patty [phone])

## 2016-07-04 NOTE — ED Triage Notes (Signed)
Pt here from [facility] [phone]) via [facility] under IVC with [facility]. Pt reports he get mad at the staff and tore off his colostomy bag and threw it around the facility. Pt covered in feces upon arrival, smells strongly of urine. Pt denies SI, HI or hallucinations. Pt calm upon arrival, forensic restraints removed. Pt decontaminated upon arrival.

## 2016-07-04 NOTE — BH Assessment (Signed)
Mario Ortega, Mario Ortega, Mario Ortega, Mario Ortega, Mario Ortega, Mario Ortega, [name]

## 2016-07-04 NOTE — Consult Note (Signed)
Pt ordered clozapine- pt is enrolled in [facility] program and is eligible. Outpatient labs q 4 weeks- inpatient labs q weekly. Last lab today 07/04/16. Submitted to [facility] program. Next lab due 1/9 while inpatient.  Ramond Dial, Pharm.D, BCPS Clinical Pharmacist

## 2016-07-04 NOTE — ED Notes (Signed)
Spoke with Mariann Laster, RN; supposed to fax [facility] to [place].

## 2016-07-04 NOTE — ED Notes (Signed)
Patient in shower 

## 2016-07-04 NOTE — Consult Note (Addendum)
[facility] Consult   Reason for Consult:  Consult for 51 year old man with a long history of schizophrenia brought in because of bizarre dangerous behavior Referring Physician:  Jimmye Norman Patient Identification: Mario Ortega MRN:  [id number] Principal Diagnosis: Schizophrenia [facility]) Diagnosis:   Patient Active Problem List   Diagnosis Date Noted  . Auditory hallucinations [R44.0]   . Homicidal ideation [R45.850]   . Suicidal ideation [R45.851]   . Anemia [D64.9] 05/30/2016  . Pill-induced gastritis [K29.70, T50.905A] 03/16/2016  . Upper abdominal pain [R10.10]   . Altered mental status [R41.82] 03/11/2016  . Thrombocytopenia ([place]) [D69.6] 03/09/2016  . AKI (acute kidney injury) ([place]) [N17.9] 03/09/2016  . CKD (chronic kidney disease), stage III [N18.3] 03/09/2016  . Lethargy [R53.83] 03/09/2016  . Dehydration [E86.0] 03/09/2016  . Tobacco use disorder [F17.200] 05/13/2015  . MRSA (methicillin resistant Staphylococcus aureus) [A49.02] 05/13/2015  . Schizophrenia ([place]) [F20.9] 05/12/2015  . Schizoaffective disorder, bipolar type ([place]) [F25.0]   . HTN (hypertension) [I10] 05/09/2015  . GERD (gastroesophageal reflux disease) [K21.9] 05/09/2015  . GI bleed [K92.2] 04/07/2015    Total Time spent with patient: 1 hour  Subjective:   Mario Ortega is a 51 y.o. male patient admitted with "things were okay until the devil got to me".  HPI:  Patient seen. Chart reviewed. Patient known from previous encounters. Labs reviewed. Notes from his recent stay at another hospital reviewed. This is a 51 year old man with a long history of schizophrenia who was brought here from his group home. He has only been there for about 3 days. According to them his behavior was okay for about the first day but rapidly decompensated. He started to become bizarre and threatening. Was taking off his close, lying down in the street, making threatening statements towards staff. By the time I saw  the patient this afternoon he had been given some medicine and had calm down. He told me that the devil had gotten to him and had been bothering him. He didn't want to give me any more detail than that. Denied that he had any intention of doing anything violent right now. Said that he had been taking his medicine regularly. Patient was placed in this group home after being stuck in the emergency room at one of another hospital in the system (I think it was [facility]) for a couple weeks.  Social history: Patient does not have any family who are able to take care of him. Has been institutionalized for decades. Multiple long stays in hospitals. I'm not certain but I believe he has a guardian.  Medical history: Multiple medical problems the most immediately pressing of which is that he has a colostomy in place. Not certain what kind of surgery he had bright think he had a bad GI bleed that required a partial resection at some point. The colostomy bag made it impossible for them to find any place that would accept him when he was in the other emergency room. Patient has a history of hyponatremia in response to medication as well.  Substance abuse history: I don't believe substance abuse has been a major part of his issue. He denies that he's used any drugs recently.  Past Psychiatric History: Long-standing mental illness going back decades. Patient claims that he murdered his grandfather. I'm not certain if that's true or not but I suspect it may be. He frequently presents as agitated and bizarre in his thinking often with a lot of hyper religious content to it. He certainly does  have a history of being threatening violent chaotic and aggressive. No known suicide attempts. He is maintained on clozapine and even on his medicine tends to be very disorganized at times. He can look good for short periods of time and then rapidly relapse even when he is on his medicine.  Risk to Self: Suicidal Ideation:  Yes-Currently Present Suicidal Intent: No Is patient at risk for suicide?: No Suicidal Plan?: No Specify Current Suicidal Plan: no plan specified Access to Means: No What has been your use of drugs/alcohol within the last 12 months?: cannabis and cocaine Triggers for Past Attempts: Unknown Intentional Self Injurious Behavior: None Risk to Others: Homicidal Ideation: Yes-Currently Present Thoughts of Harm to Others: Yes-Currently Present Comment - Thoughts of Harm to Others: voices telling him to harm others Current Homicidal Intent: Yes-Currently Present Current Homicidal Plan: No Access to Homicidal Means: No Identified Victim: people at the group home,  History of harm to others?: Yes Assessment of Violence: On admission Violent Behavior Description: reports murdering his Grandfather, hurt group home staff Does patient have access to weapons?: No Criminal Charges Pending?: No Does patient have a court date: No Prior Inpatient Therapy: Prior Inpatient Therapy: Yes Prior Therapy Dates: Unknown Prior Therapy Facilty/Provider(s): [facility] Reason for Treatment: Schizoaffective Disorder Prior Outpatient Therapy: Prior Outpatient Therapy: Yes Prior Therapy Dates: Ongoing Prior Therapy Facilty/Provider([facility]): [facility] Reason for Treatment: Schizoaffective Does patient have an ACCT team?: Yes Does patient have Intensive In-House Services?  : No Does patient have [facility] services? : No Does patient have [facility] services?: No  Past Medical History:  Past Medical History:  Diagnosis Date  . Anemia    "Nacrocytic"  . Cancer [facility])    colon cancer  . Colostomy in place [facility])   . Drug abuse   . Edentulous   . GERD (gastroesophageal reflux disease)   . Hypertension   . MRSA (methicillin resistant staph aureus) culture positive   . Schizo affective schizophrenia [facility])     Past Surgical History:  Procedure Laterality Date  . arm surgery    . COLOSTOMY    . COLOSTOMY      Family History: No family history on file. Family Psychiatric  History: None known Social History:  History  Alcohol Use No     History  Drug Use  . Types: Cocaine, Marijuana    Comment: Pt denied current use    Social History   Social History  . Marital status: Single    Spouse name: N/A  . Number of children: N/A  . Years of education: N/A   Social History Main Topics  . Smoking status: Current Every Day Smoker    Packs/day: 0.50  . Smokeless tobacco: Never Used  . Alcohol use No  . Drug use:     Types: Cocaine, Marijuana     Comment: Pt denied current use  . Sexual activity: No   Other Topics Concern  . Not on file   Social History Narrative  . No narrative on file   Additional Social History:    Allergies:   Allergies  Allergen Reactions  . Penicillins Rash    Has patient had a PCN reaction causing immediate rash, facial/tongue/throat swelling, SOB or lightheadedness with hypotension: No Has patient had a PCN reaction causing severe rash involving mucus membranes or skin necrosis: No Has patient had a PCN reaction that required hospitalization No Has patient had a PCN reaction occurring within the last 10 years: No If all of the  above answers are "NO", then may proceed with Cephalosporin use.     Labs:  Results for orders placed or performed during the hospital encounter of 07/04/16 (from the past 48 hour(s))  CBC with Differential/Platelet     Status: Abnormal   Collection Time: 07/04/16 12:57 PM  Result Value Ref Range   WBC 10.5 3.8 - 10.6 K/uL   RBC 3.23 (L) 4.40 - 5.90 MIL/uL   Hemoglobin 10.4 (L) 13.0 - 18.0 g/dL   HCT 30.9 (L) 40.0 - 52.0 %   MCV 95.5 80.0 - 100.0 fL   MCH 32.1 26.0 - 34.0 pg   MCHC 33.6 32.0 - 36.0 g/dL   RDW 15.4 (H) 11.5 - 14.5 %   Platelets 312 150 - 440 K/uL   Neutrophils Relative % 69 %   Neutro Abs 7.2 (H) 1.4 - 6.5 K/uL   Lymphocytes Relative 20 %   Lymphs Abs 2.1 1.0 - 3.6 K/uL   Monocytes Relative 9 %    Monocytes Absolute 1.0 0.2 - 1.0 K/uL   Eosinophils Relative 2 %   Eosinophils Absolute 0.2 0 - 0.7 K/uL   Basophils Relative 0 %   Basophils Absolute 0.0 0 - 0.1 K/uL  Comprehensive metabolic panel     Status: None   Collection Time: 07/04/16 12:57 PM  Result Value Ref Range   Sodium 139 135 - 145 mmol/L   Potassium 3.5 3.5 - 5.1 mmol/L   Chloride 108 101 - 111 mmol/L   CO2 24 22 - 32 mmol/L   Glucose, Bld 97 65 - 99 mg/dL   BUN 10 6 - 20 mg/dL   Creatinine, Ser 1.12 0.61 - 1.24 mg/dL   Calcium 9.8 8.9 - 10.3 mg/dL   Total Protein 6.7 6.5 - 8.1 g/dL   Albumin 3.7 3.5 - 5.0 g/dL   AST 23 15 - 41 U/L   ALT 20 17 - 63 U/L   Alkaline Phosphatase 82 38 - 126 U/L   Total Bilirubin 0.9 0.3 - 1.2 mg/dL   GFR calc non Af Amer >60 >60 mL/min   GFR calc Af Amer >60 >60 mL/min    Comment: (NOTE) The eGFR has been calculated using the CKD EPI equation. This calculation has not been validated in all clinical situations. eGFR's persistently <60 mL/min signify possible Chronic Kidney Disease.    Anion gap 7 5 - 15  Ethanol     Status: None   Collection Time: 07/04/16 12:57 PM  Result Value Ref Range   Alcohol, Ethyl (B) <5 <5 mg/dL    Comment:        LOWEST DETECTABLE LIMIT FOR SERUM ALCOHOL IS 5 mg/dL FOR MEDICAL PURPOSES ONLY     Current Facility-Administered Medications  Medication Dose Route Frequency Provider Last Rate Last Dose  . amLODipine (NORVASC) tablet 10 mg  10 mg Oral Daily Gonzella Lex, MD   10 mg at 07/04/16 1520  . benztropine (COGENTIN) tablet 1 mg  1 mg Oral QHS Gonzella Lex, MD      . carvedilol (COREG) tablet 25 mg  25 mg Oral BID WC Gonzella Lex, MD      . cloZAPine (CLOZARIL) tablet 150 mg  150 mg Oral Daily Gonzella Lex, MD   150 mg at 07/04/16 1521  . cloZAPine (CLOZARIL) tablet 350 mg  350 mg Oral QHS Gonzella Lex, MD      . desmopressin (DDAVP) tablet 0.4 mg  0.4 mg Oral QHS Simmie Camerer T  Violeta Lecount, MD      . folic acid (FOLVITE) tablet 2 mg  2 mg Oral  Daily Gonzella Lex, MD   2 mg at 07/04/16 1521  . haloperidol (HALDOL) tablet 5 mg  5 mg Oral Q6H PRN Gonzella Lex, MD      . sertraline (ZOLOFT) tablet 100 mg  100 mg Oral QHS Gonzella Lex, MD      . traZODone (DESYREL) tablet 100 mg  100 mg Oral QHS Gonzella Lex, MD       Current Outpatient Prescriptions  Medication Sig Dispense Refill  . cloZAPine (CLOZARIL) 100 MG tablet Take 150-350 mg by mouth 2 (two) times daily. '150mg'$  in the morning and take '350mg'$  at bedtime    . haloperidol (HALDOL) 5 MG tablet Take 5 mg by mouth 2 (two) times daily as needed for agitation (Also for aggressive impulses and voices).    . traZODone (DESYREL) 100 MG tablet Take 100 mg by mouth at bedtime as needed for sleep.    Marland Kitchen acetaminophen (TYLENOL) 325 MG tablet Take 2 tablets (650 mg total) by mouth every 6 (six) hours as needed for mild pain (or Fever >/= 101). (Patient not taking: Reported on 07/04/2016)    . amLODipine (NORVASC) 10 MG tablet Take 1 tablet (10 mg total) by mouth at bedtime. 30 tablet 0  . benztropine (COGENTIN) 1 MG tablet Take 1 tablet (1 mg total) by mouth 2 (two) times daily. (Patient taking differently: Take 1 mg by mouth at bedtime. ) 10 tablet 0  . carvedilol (COREG) 25 MG tablet Take 1 tablet (25 mg total) by mouth 2 (two) times daily with a meal. 60 tablet 0  . desmopressin (DDAVP) 0.2 MG tablet Take 2 tablets (0.4 mg total) by mouth at bedtime. 30 tablet 0  . folic acid (FOLVITE) 1 MG tablet Take 2 tablets (2 mg total) by mouth daily. 30 tablet 0  . haloperidol (HALDOL) 10 MG tablet Take 0.5-1 tablets (5-10 mg total) by mouth 3 (three) times daily. Take '5mg'$  in the morning, take '10mg'$  by mouth at 2:00pm, and at bedtime. 30 tablet 0  . ipratropium (ATROVENT) 0.03 % nasal spray Place 2 sprays into both nostrils every 12 (twelve) hours. (Patient taking differently: 2 sprays 2 (two) times daily as needed (for drooling). Use sublingually. Up to twice a day, as needed) 30 mL 12  . sertraline  (ZOLOFT) 100 MG tablet Take 1 tablet (100 mg total) by mouth at bedtime. 30 tablet 0    Musculoskeletal: Strength & Muscle Tone: within normal limits Gait & Station: normal Patient leans: N/A  Psychiatric Specialty Exam: Physical Exam  Nursing note and vitals reviewed. Constitutional: He appears well-developed and well-nourished.  HENT:  Head: Normocephalic and atraumatic.  Eyes: Conjunctivae are normal. Pupils are equal, round, and reactive to light.  Neck: Normal range of motion.  Cardiovascular: Normal heart sounds.   Respiratory: Effort normal.  GI: Soft.    Musculoskeletal: Normal range of motion.  Neurological: He is alert.  Skin: Skin is warm and dry.  Psychiatric: His affect is blunt. His speech is delayed. He is slowed. Thought content is paranoid and delusional. Cognition and memory are impaired. He expresses impulsivity. He expresses no suicidal ideation.    Review of Systems  Constitutional: Negative.   HENT: Negative.   Eyes: Negative.   Respiratory: Negative.   Cardiovascular: Negative.   Gastrointestinal: Negative.   Musculoskeletal: Negative.   Skin: Negative.   Neurological: Negative.  Psychiatric/Behavioral: Positive for hallucinations. Negative for depression, memory loss, substance abuse and suicidal ideas. The patient is nervous/anxious. The patient does not have insomnia.     Blood pressure (!) 149/98, pulse 83, temperature 97.6 F (36.4 C), temperature source Oral, resp. rate 18, SpO2 99 %.There is no height or weight on file to calculate BMI.  General Appearance: Casual  Eye Contact:  Minimal  Speech:  Slow  Volume:  Decreased  Mood:  Euthymic  Affect:  Blunt  Thought Process:  Goal Directed  Orientation:  Full (Time, Place, and Person)  Thought Content:  Delusions and Paranoid Ideation  Suicidal Thoughts:  No  Homicidal Thoughts:  No  Memory:  Immediate;   Fair Recent;   Poor Remote;   Fair  Judgement:  Impaired  Insight:  Shallow    Psychomotor Activity:  Decreased  Concentration:  Concentration: Fair  Recall:  [employer] of Knowledge:  Fair  Language:  Fair  Akathisia:  No  Handed:  Right  AIMS (if indicated):     Assets:  Warehouse manager Resources/Insurance  ADL's:  Impaired  Cognition:  WNL  Sleep:        Treatment Plan Summary: Daily contact with patient to assess and evaluate symptoms and progress in treatment, Medication management and Plan 51 year old man with schizophrenia. He was at [facility] for a couple weeks in December. During that time they had looked into referring him back to [facility]. It looks like they were making some progress with that when they unfortunately had to ask one of the tele-psychiatry people to do a reevaluation for commitment. At that point apparently the patient was able to present himself as well enough that they let him off commitment and then placed him at this group home. As is typical for him he has reverted into severe psychosis quickly. No known stress for it other than being in a new environment. Right now in the emergency room he has taken his medicine and has been reasonably calm. We will be in some difficulty trying to find placement for him. I doubt that our unit will be able to take care of him since he still has the colostomy in place. I am going to uphold the commitment and recommend that we continue looking into [facility] given the current events. Medications renewed. Labs reviewed.  Disposition: Recommend psychiatric Inpatient admission when medically cleared. Supportive therapy provided about ongoing stressors.  Alethia Berthold, MD 07/04/2016 4:50 PM

## 2016-07-05 DIAGNOSIS — F259 Schizoaffective disorder, unspecified: Secondary | ICD-10-CM | POA: Diagnosis not present

## 2016-07-05 NOTE — ED Notes (Addendum)
Pt's colostomy bag came loose, and stool was all over pt, bedding. Bag changed, as well as linens and pt's clothes.   Pt has some skin breakdown around colostomy bag. Area washed and dried thoroughly.

## 2016-07-05 NOTE — ED Notes (Signed)
Pt. Urinated on himself, pt. Also pulled off ostomy bag.  Pt. Encouraged to assist in cleaning and maintain ostomy bag and self cleaning.  Pt. Helped with clean up and changing of ostomy bag.

## 2016-07-05 NOTE — ED Notes (Signed)
Pt continually requesting drinks. Told pt he would be limited to water until lunch time. Pt received more water and then informed NT that he had vomited in sink. Pt denies nausea. Pt given emesis bag and told that he will not be given any more drinks for a while, as he is apparently taking in too much. Will continue to observe pt for any further symptoms.

## 2016-07-05 NOTE — ED Notes (Signed)
Gave pt new socks.

## 2016-07-05 NOTE — ED Notes (Signed)
Pt standing at sink with pants down. Changed pt's diaper (which was not wet) because he was concerned about it and it was half off. Pt requesting food and drink (again). Pt informed that lunch will be here in about an hour and he decided to lie down and rest.

## 2016-07-05 NOTE — ED Notes (Signed)
Lunch tray placed in room. Pt sleeping

## 2016-07-05 NOTE — ED Provider Notes (Signed)
-----------------------------------------   7:05 AM on 07/05/2016 -----------------------------------------   Blood pressure 127/87, pulse 87, temperature 97 F (36.1 C), temperature source Axillary, resp. rate 18, SpO2 100 %.  The patient had no acute events since last update.  Calm and cooperative at this time.  Disposition is pending Psychiatry/Behavioral Medicine team recommendations.     Paulette Blanch, MD 07/05/16 [phone]

## 2016-07-05 NOTE — ED Notes (Signed)
Gave sprite.

## 2016-07-05 NOTE — ED Notes (Signed)
Pt given supper tray.

## 2016-07-05 NOTE — ED Notes (Signed)
Gave grahm crackers

## 2016-07-05 NOTE — ED Notes (Signed)
Pt bedding changed and pt given clean burgundy scrubs to change in to.

## 2016-07-05 NOTE — ED Notes (Signed)
Pt has thrown food tray and cup down in the floor and is asking for more to drink. Pt told he will need to wait.

## 2016-07-05 NOTE — ED Notes (Signed)
Gave food tray. 

## 2016-07-05 NOTE — Consult Note (Signed)
[place] [facility] Consult   Reason for Consult:  Consult for 51 year old man with a long history of schizophrenia brought in because of bizarre dangerous behavior Referring Physician:  Jimmye Norman Patient Identification: Mario Ortega MRN:  [id number] Principal Diagnosis: Schizophrenia [facility]) Diagnosis:   Patient Active Problem List   Diagnosis Date Noted  . Auditory hallucinations [R44.0]   . Homicidal ideation [R45.850]   . Suicidal ideation [R45.851]   . Anemia [D64.9] 05/30/2016  . Pill-induced gastritis [K29.70, T50.905A] 03/16/2016  . Upper abdominal pain [R10.10]   . Altered mental status [R41.82] 03/11/2016  . Thrombocytopenia ([place]) [D69.6] 03/09/2016  . AKI (acute kidney injury) ([place]) [N17.9] 03/09/2016  . CKD (chronic kidney disease), stage III [N18.3] 03/09/2016  . Lethargy [R53.83] 03/09/2016  . Dehydration [E86.0] 03/09/2016  . Tobacco use disorder [F17.200] 05/13/2015  . MRSA (methicillin resistant Staphylococcus aureus) [A49.02] 05/13/2015  . Schizophrenia ([place]) [F20.9] 05/12/2015  . Schizoaffective disorder, bipolar type ([place]) [F25.0]   . HTN (hypertension) [I10] 05/09/2015  . GERD (gastroesophageal reflux disease) [K21.9] 05/09/2015  . GI bleed [K92.2] 04/07/2015    Total Time spent with patient: 20 minutes  Subjective:   Mario Ortega is a 51 y.o. male patient admitted with "things were okay until the devil got to me".  HPI:  Patient seen. Chart reviewed. Patient known from previous encounters. Labs reviewed. Notes from his recent stay at another hospital reviewed. This is a 51 year old man with a long history of schizophrenia who was brought here from his group home. He has only been there for about 3 days. According to them his behavior was okay for about the first day but rapidly decompensated. He started to become bizarre and threatening. Was taking off his close, lying down in the street, making threatening statements towards staff. By the time I  saw the patient this afternoon he had been given some medicine and had calm down. He told me that the devil had gotten to him and had been bothering him. He didn't want to give me any more detail than that. Denied that he had any intention of doing anything violent right now. Said that he had been taking his medicine regularly. Patient was placed in this group home after being stuck in the emergency room at one of another hospital in the system (I think it was [facility]) for a couple weeks.  Social history: Patient does not have any family who are able to take care of him. Has been institutionalized for decades. Multiple long stays in hospitals. I'm not certain but I believe he has a guardian.  Medical history: Multiple medical problems the most immediately pressing of which is that he has a colostomy in place. Not certain what kind of surgery he had bright think he had a bad GI bleed that required a partial resection at some point. The colostomy bag made it impossible for them to find any place that would accept him when he was in the other emergency room. Patient has a history of hyponatremia in response to medication as well.  Substance abuse history: I don't believe substance abuse has been a major part of his issue. He denies that he's used any drugs recently.  Past Psychiatric History: Long-standing mental illness going back decades. Patient claims that he murdered his grandfather. I'm not certain if that's true or not but I suspect it may be. He frequently presents as agitated and bizarre in his thinking often with a lot of hyper religious content to it. He certainly does  have a history of being threatening violent chaotic and aggressive. No known suicide attempts. He is maintained on clozapine and even on his medicine tends to be very disorganized at times. He can look good for short periods of time and then rapidly relapse even when he is on his medicine.  Follow-up on Wednesday the third.  Patient seen. Patient has been compliant with his medicine. He is mildly sedated this afternoon. Still disorganized in his thinking but has not been agitated or violent. Physically appears to be stable. I have asked TTS to refer him to [facility] but in the meantime we are continuing current medicine.  Risk to Self: Suicidal Ideation: Yes-Currently Present Suicidal Intent: No Is patient at risk for suicide?: No Suicidal Plan?: No Specify Current Suicidal Plan: no plan specified Access to Means: No What has been your use of drugs/alcohol within the last 12 months?: cannabis and cocaine Triggers for Past Attempts: Unknown Intentional Self Injurious Behavior: None Risk to Others: Homicidal Ideation: Yes-Currently Present Thoughts of Harm to Others: Yes-Currently Present Comment - Thoughts of Harm to Others: voices telling him to harm others Current Homicidal Intent: Yes-Currently Present Current Homicidal Plan: No Access to Homicidal Means: No Identified Victim: people at the group home,  History of harm to others?: Yes Assessment of Violence: On admission Violent Behavior Description: reports murdering his Grandfather, hurt group home staff Does patient have access to weapons?: No Criminal Charges Pending?: No Does patient have a court date: No Prior Inpatient Therapy: Prior Inpatient Therapy: Yes Prior Therapy Dates: Unknown Prior Therapy Facilty/Provider(s): [facility] Reason for Treatment: Schizoaffective Disorder Prior Outpatient Therapy: Prior Outpatient Therapy: Yes Prior Therapy Dates: Ongoing Prior Therapy Facilty[facility]Provider([facility]): [facility] Reason for Treatment: Schizoaffective Does patient have an ACCT team?: Yes Does patient have Intensive In-House Services?  : No Does patient have [facility] services? : No Does patient have P4CC services?: No  Past Medical History:  Past Medical History:  Diagnosis Date  . Anemia    "Nacrocytic"  . Cancer  [facility])    colon cancer  . Colostomy in place [facility])   . Drug abuse   . Edentulous   . GERD (gastroesophageal reflux disease)   . Hypertension   . MRSA (methicillin resistant staph aureus) culture positive   . Schizo affective schizophrenia [facility])     Past Surgical History:  Procedure Laterality Date  . arm surgery    . COLOSTOMY    . COLOSTOMY     Family History: No family history on file. Family Psychiatric  History: None known Social History:  History  Alcohol Use No     History  Drug Use  . Types: Cocaine, Marijuana    Comment: Pt denied current use    Social History   Social History  . Marital status: Single    Spouse name: N/A  . Number of children: N/A  . Years of education: N/A   Social History Main Topics  . Smoking status: Current Every Day Smoker    Packs/day: 0.50  . Smokeless tobacco: Never Used  . Alcohol use No  . Drug use:     Types: Cocaine, Marijuana     Comment: Pt denied current use  . Sexual activity: No   Other Topics Concern  . Not on file   Social History Narrative  . No narrative on file   Additional Social History:    Allergies:   Allergies  Allergen Reactions  . Penicillins Rash    Has patient  had a PCN reaction causing immediate rash, facial/tongue/throat swelling, SOB or lightheadedness with hypotension: No Has patient had a PCN reaction causing severe rash involving mucus membranes or skin necrosis: No Has patient had a PCN reaction that required hospitalization No Has patient had a PCN reaction occurring within the last 10 years: No If all of the above answers are "NO", then may proceed with Cephalosporin use.     Labs:  Results for orders placed or performed during the hospital encounter of 07/04/16 (from the past 48 hour(s))  CBC with Differential/Platelet     Status: Abnormal   Collection Time: 07/04/16 12:57 PM  Result Value Ref Range   WBC 10.5 3.8 - 10.6 K/uL   RBC 3.23 (L) 4.40 - 5.90 MIL/uL   Hemoglobin 10.4  (L) 13.0 - 18.0 g/dL   HCT 30.9 (L) 40.0 - 52.0 %   MCV 95.5 80.0 - 100.0 fL   MCH 32.1 26.0 - 34.0 pg   MCHC 33.6 32.0 - 36.0 g/dL   RDW 15.4 (H) 11.5 - 14.5 %   Platelets 312 150 - 440 K/uL   Neutrophils Relative % 69 %   Neutro Abs 7.2 (H) 1.4 - 6.5 K/uL   Lymphocytes Relative 20 %   Lymphs Abs 2.1 1.0 - 3.6 K/uL   Monocytes Relative 9 %   Monocytes Absolute 1.0 0.2 - 1.0 K/uL   Eosinophils Relative 2 %   Eosinophils Absolute 0.2 0 - 0.7 K/uL   Basophils Relative 0 %   Basophils Absolute 0.0 0 - 0.1 K/uL  Comprehensive metabolic panel     Status: None   Collection Time: 07/04/16 12:57 PM  Result Value Ref Range   Sodium 139 135 - 145 mmol/L   Potassium 3.5 3.5 - 5.1 mmol/L   Chloride 108 101 - 111 mmol/L   CO2 24 22 - 32 mmol/L   Glucose, Bld 97 65 - 99 mg/dL   BUN 10 6 - 20 mg/dL   Creatinine, Ser 1.12 0.61 - 1.24 mg/dL   Calcium 9.8 8.9 - 10.3 mg/dL   Total Protein 6.7 6.5 - 8.1 g/dL   Albumin 3.7 3.5 - 5.0 g/dL   AST 23 15 - 41 U/L   ALT 20 17 - 63 U/L   Alkaline Phosphatase 82 38 - 126 U/L   Total Bilirubin 0.9 0.3 - 1.2 mg/dL   GFR calc non Af Amer >60 >60 mL/min   GFR calc Af Amer >60 >60 mL/min    Comment: (NOTE) The eGFR has been calculated using the CKD EPI equation. This calculation has not been validated in all clinical situations. eGFR's persistently <60 mL/min signify possible Chronic Kidney Disease.    Anion gap 7 5 - 15  Ethanol     Status: None   Collection Time: 07/04/16 12:57 PM  Result Value Ref Range   Alcohol, Ethyl (B) <5 <5 mg/dL    Comment:        LOWEST DETECTABLE LIMIT FOR SERUM ALCOHOL IS 5 mg/dL FOR MEDICAL PURPOSES ONLY     Current Facility-Administered Medications  Medication Dose Route Frequency Provider Last Rate Last Dose  . amLODipine (NORVASC) tablet 10 mg  10 mg Oral Daily Gonzella Lex, MD   10 mg at 07/05/16 1029  . benztropine (COGENTIN) tablet 1 mg  1 mg Oral QHS Gonzella Lex, MD   1 mg at 07/04/16 2120  .  carvedilol (COREG) tablet 25 mg  25 mg Oral BID WC Luay Balding T Yukari Flax,  MD   25 mg at 07/05/16 0807  . cloZAPine (CLOZARIL) tablet 150 mg  150 mg Oral Daily Gonzella Lex, MD   150 mg at 07/05/16 1028  . cloZAPine (CLOZARIL) tablet 350 mg  350 mg Oral QHS Gonzella Lex, MD   350 mg at 07/04/16 2120  . desmopressin (DDAVP) tablet 0.4 mg  0.4 mg Oral QHS Gonzella Lex, MD   0.4 mg at 95/62/13 0865  . folic acid (FOLVITE) tablet 2 mg  2 mg Oral Daily Gonzella Lex, MD   2 mg at 07/05/16 1028  . haloperidol (HALDOL) tablet 5 mg  5 mg Oral Q6H PRN Gonzella Lex, MD   5 mg at 07/05/16 1055  . sertraline (ZOLOFT) tablet 100 mg  100 mg Oral QHS Gonzella Lex, MD   100 mg at 07/04/16 2121  . traZODone (DESYREL) tablet 100 mg  100 mg Oral QHS Gonzella Lex, MD   100 mg at 07/04/16 2121   Current Outpatient Prescriptions  Medication Sig Dispense Refill  . amLODipine (NORVASC) 10 MG tablet Take 1 tablet (10 mg total) by mouth at bedtime. 30 tablet 0  . benztropine (COGENTIN) 1 MG tablet Take 1 tablet (1 mg total) by mouth 2 (two) times daily. (Patient taking differently: Take 1 mg by mouth at bedtime. ) 10 tablet 0  . carvedilol (COREG) 25 MG tablet Take 1 tablet (25 mg total) by mouth 2 (two) times daily with a meal. 60 tablet 0  . cloZAPine (CLOZARIL) 100 MG tablet Take 150-350 mg by mouth 2 (two) times daily. '150mg'$  in the morning and take '350mg'$  at bedtime    . folic acid (FOLVITE) 1 MG tablet Take 2 tablets (2 mg total) by mouth daily. 30 tablet 0  . haloperidol (HALDOL) 10 MG tablet Take 0.5-1 tablets (5-10 mg total) by mouth 3 (three) times daily. Take '5mg'$  in the morning, take '10mg'$  by mouth at 2:00pm, and at bedtime. 30 tablet 0  . haloperidol (HALDOL) 5 MG tablet Take 10 mg by mouth 2 (two) times daily as needed for agitation (Also for aggressive impulses and voices).     . traZODone (DESYREL) 100 MG tablet Take 100 mg by mouth at bedtime as needed for sleep.    Marland Kitchen acetaminophen (TYLENOL) 325 MG tablet  Take 2 tablets (650 mg total) by mouth every 6 (six) hours as needed for mild pain (or Fever >/= 101). (Patient not taking: Reported on 07/04/2016)    . desmopressin (DDAVP) 0.2 MG tablet Take 2 tablets (0.4 mg total) by mouth at bedtime. (Patient not taking: Reported on 07/04/2016) 30 tablet 0  . ipratropium (ATROVENT) 0.03 % nasal spray Place 2 sprays into both nostrils every 12 (twelve) hours. (Patient not taking: Reported on 07/04/2016) 30 mL 12  . sertraline (ZOLOFT) 100 MG tablet Take 1 tablet (100 mg total) by mouth at bedtime. (Patient not taking: Reported on 07/04/2016) 30 tablet 0    Musculoskeletal: Strength & Muscle Tone: within normal limits Gait & Station: normal Patient leans: N/A  Psychiatric Specialty Exam: Physical Exam  Nursing note and vitals reviewed. Constitutional: He appears well-developed and well-nourished.  HENT:  Head: Normocephalic and atraumatic.  Eyes: Conjunctivae are normal. Pupils are equal, round, and reactive to light.  Neck: Normal range of motion.  Cardiovascular: Normal heart sounds.   Respiratory: Effort normal.  GI: Soft.    Musculoskeletal: Normal range of motion.  Neurological: He is alert.  Skin: Skin is warm  and dry.  Psychiatric: His affect is blunt. His speech is delayed. He is slowed. Thought content is paranoid and delusional. Cognition and memory are impaired. He expresses impulsivity. He expresses no suicidal ideation.    Review of Systems  Constitutional: Negative.   HENT: Negative.   Eyes: Negative.   Respiratory: Negative.   Cardiovascular: Negative.   Gastrointestinal: Negative.   Musculoskeletal: Negative.   Skin: Negative.   Neurological: Negative.   Psychiatric/Behavioral: Positive for hallucinations. Negative for depression, memory loss, substance abuse and suicidal ideas. The patient is nervous/anxious. The patient does not have insomnia.     Blood pressure 127/87, pulse 87, temperature 97 F (36.1 C), temperature source  Axillary, resp. rate 18, SpO2 100 %.There is no height or weight on file to calculate BMI.  General Appearance: Casual  Eye Contact:  Minimal  Speech:  Slow  Volume:  Decreased  Mood:  Euthymic  Affect:  Blunt  Thought Process:  Goal Directed  Orientation:  Full (Time, Place, and Person)  Thought Content:  Delusions and Paranoid Ideation  Suicidal Thoughts:  No  Homicidal Thoughts:  No  Memory:  Immediate;   Fair Recent;   Poor Remote;   Fair  Judgement:  Impaired  Insight:  Shallow  Psychomotor Activity:  Decreased  Concentration:  Concentration: Fair  Recall:  [employer] of Knowledge:  Fair  Language:  Fair  Akathisia:  No  Handed:  Right  AIMS (if indicated):     Assets:  Warehouse manager Resources/Insurance  ADL's:  Impaired  Cognition:  WNL  Sleep:        Treatment Plan Summary: If he is compliant with medication and behavior is very stable we may be able to consider discharge back to his group home if they would take him given that [facility] be a long process. No change to treatment plan for today. Continue daily evaluation. Continue medicine Disposition: Recommend psychiatric Inpatient admission when medically cleared. Supportive therapy provided about ongoing stressors.  Alethia Berthold, MD 07/05/2016 3:37 PM

## 2016-07-05 NOTE — ED Notes (Signed)
Pt calling out for nurse. When this nurse entered room, he stated that he is having flashbacks of when he killed his grandfather because he was on drugs. Pt is calm and voice is even; pt is lying in his bed at this time. Informed pt that we will check back with him in a few minutes to see how he is feeling. Will continue to monitor

## 2016-07-05 NOTE — ED Notes (Signed)
Gave water 

## 2016-07-06 DIAGNOSIS — F259 Schizoaffective disorder, unspecified: Secondary | ICD-10-CM | POA: Diagnosis not present

## 2016-07-06 LAB — URINE DRUG SCREEN, QUALITATIVE (ARMC ONLY)
AMPHETAMINES, UR SCREEN: NOT DETECTED
BENZODIAZEPINE, UR SCRN: POSITIVE — AB
Barbiturates, Ur Screen: NOT DETECTED
Cannabinoid 50 Ng, Ur ~~LOC~~: NOT DETECTED
Cocaine Metabolite,Ur ~~LOC~~: NOT DETECTED
MDMA (ECSTASY) UR SCREEN: NOT DETECTED
Methadone Scn, Ur: NOT DETECTED
Opiate, Ur Screen: NOT DETECTED
PHENCYCLIDINE (PCP) UR S: NOT DETECTED
TRICYCLIC, UR SCREEN: NOT DETECTED

## 2016-07-06 MED ORDER — LORAZEPAM 2 MG/ML IJ SOLN
1.0000 mg | Freq: Once | INTRAMUSCULAR | Status: AC
  Administered 2016-07-06: 1 mg via INTRAMUSCULAR

## 2016-07-06 MED ORDER — LORAZEPAM 2 MG/ML IJ SOLN
INTRAMUSCULAR | Status: AC
  Administered 2016-07-06: 1 mg via INTRAMUSCULAR
  Filled 2016-07-06: qty 1

## 2016-07-06 NOTE — ED Notes (Signed)
Pt. Removed all clothing and put clean diaper in sink.  Pt. Encouraged to put clothes back on, pt. Given dry diaper to put on.  With encouragement pt. Able to put clothes back on and get into bed.

## 2016-07-06 NOTE — ED Notes (Signed)
Pt. Requested help to change garments.  Pt. Helped to change into clean scrubs.

## 2016-07-06 NOTE — ED Notes (Addendum)
Pt pulled colostomy bag and wafer off leaving stool all over bed, self and floor. Assisted pt to clean self off, replaced colostomy bag and wafer with new ones, assisted pt to change in clean burgundy scrubs and placed clean sheets on bed. Pt calm and cooperative during the process. EVS was called and overhead paged to come clean floor but no answer.

## 2016-07-06 NOTE — BH Assessment (Signed)
[facility] followed up with referral for Inpatient treatment  [facility] ([id number]), No beds,   [facility]-([phone]), Declined, at this time have an appropriate bed for the patient.  [facility] Cella [phone] or [phone]), Unable to reach anyone.  [facility] ([phone]),"He was declined due to NCQMD (None Clinical Qualified Medical Needs) Colostomy Bag"  [facility]-Unable to reach anyone.  [facility]- Unable to reach anyone.   [facility] ([id number]), declined due to Colostomy bag, taking it off and throwing feces.  [place] [phone] reach anyone.

## 2016-07-06 NOTE — ED Notes (Signed)
Patient continued to adjust bed and remove cushion from top of stretcher, stretcher removed from patients room, cushion, sheet and blankets supplied to patient.  Patient laid down on mattress and stated he would try to get some sleep.

## 2016-07-06 NOTE — ED Notes (Signed)
EVS to room now to clean and mop floor.

## 2016-07-06 NOTE — BH Assessment (Signed)
Received phone call from [facility] Vertis Kelch Hicks-[phone]), patient was on their wait list.

## 2016-07-06 NOTE — Consult Note (Signed)
[facility] Consult   Reason for Consult:  Consult for 51 year old man with a long history of schizophrenia brought in because of bizarre dangerous behavior Referring Physician:  Jimmye Norman Patient Identification: Treydan Madeja MRN:  [id number] Principal Diagnosis: Schizophrenia [facility]) Diagnosis:   Patient Active Problem List   Diagnosis Date Noted  . Auditory hallucinations [R44.0]   . Homicidal ideation [R45.850]   . Suicidal ideation [R45.851]   . Anemia [D64.9] 05/30/2016  . Pill-induced gastritis [K29.70, T50.905A] 03/16/2016  . Upper abdominal pain [R10.10]   . Altered mental status [R41.82] 03/11/2016  . Thrombocytopenia ([place]) [D69.6] 03/09/2016  . AKI (acute kidney injury) ([place]) [N17.9] 03/09/2016  . CKD (chronic kidney disease), stage III [N18.3] 03/09/2016  . Lethargy [R53.83] 03/09/2016  . Dehydration [E86.0] 03/09/2016  . Tobacco use disorder [F17.200] 05/13/2015  . MRSA (methicillin resistant Staphylococcus aureus) [A49.02] 05/13/2015  . Schizophrenia ([place]) [F20.9] 05/12/2015  . Schizoaffective disorder, bipolar type ([place]) [F25.0]   . HTN (hypertension) [I10] 05/09/2015  . GERD (gastroesophageal reflux disease) [K21.9] 05/09/2015  . GI bleed [K92.2] 04/07/2015    Total Time spent with patient: 20 minutes  Subjective:   Mario Ortega is a 51 y.o. male patient admitted with "things were okay until the devil got to me".  Patient seen. This afternoon he was sound asleep having just gotten some medication. Staff tells me that he was awake and alert earlier today. Apparently fairly disruptive at one point.  HPI:  Patient seen. Chart reviewed. Patient known from previous encounters. Labs reviewed. Notes from his recent stay at another hospital reviewed. This is a 51 year old man with a long history of schizophrenia who was brought here from his group home. He has only been there for about 3 days. According to them his behavior was okay for about the first  day but rapidly decompensated. He started to become bizarre and threatening. Was taking off his close, lying down in the street, making threatening statements towards staff. By the time I saw the patient this afternoon he had been given some medicine and had calm down. He told me that the devil had gotten to him and had been bothering him. He didn't want to give me any more detail than that. Denied that he had any intention of doing anything violent right now. Said that he had been taking his medicine regularly. Patient was placed in this group home after being stuck in the emergency room at one of another hospital in the system (I think it was [facility]) for a couple weeks.  Social history: Patient does not have any family who are able to take care of him. Has been institutionalized for decades. Multiple long stays in hospitals. I'm not certain but I believe he has a guardian.  Medical history: Multiple medical problems the most immediately pressing of which is that he has a colostomy in place. Not certain what kind of surgery he had bright think he had a bad GI bleed that required a partial resection at some point. The colostomy bag made it impossible for them to find any place that would accept him when he was in the other emergency room. Patient has a history of hyponatremia in response to medication as well.  Substance abuse history: I don't believe substance abuse has been a major part of his issue. He denies that he's used any drugs recently.  Past Psychiatric History: Long-standing mental illness going back decades. Patient claims that he murdered his grandfather. I'm not certain if that's true  or not but I suspect it may be. He frequently presents as agitated and bizarre in his thinking often with a lot of hyper religious content to it. He certainly does have a history of being threatening violent chaotic and aggressive. No known suicide attempts. He is maintained on clozapine and even on his  medicine tends to be very disorganized at times. He can look good for short periods of time and then rapidly relapse even when he is on his medicine.  Follow-up on Wednesday the third. Patient seen. Patient has been compliant with his medicine. He is mildly sedated this afternoon. Still disorganized in his thinking but has not been agitated or violent. Physically appears to be stable. I have asked TTS to refer him to [facility] but in the meantime we are continuing current medicine.  Risk to Self: Suicidal Ideation: Yes-Currently Present Suicidal Intent: No Is patient at risk for suicide?: No Suicidal Plan?: No Specify Current Suicidal Plan: no plan specified Access to Means: No What has been your use of drugs/alcohol within the last 12 months?: cannabis and cocaine Triggers for Past Attempts: Unknown Intentional Self Injurious Behavior: None Risk to Others: Homicidal Ideation: Yes-Currently Present Thoughts of Harm to Others: Yes-Currently Present Comment - Thoughts of Harm to Others: voices telling him to harm others Current Homicidal Intent: Yes-Currently Present Current Homicidal Plan: No Access to Homicidal Means: No Identified Victim: people at the group home,  History of harm to others?: Yes Assessment of Violence: On admission Violent Behavior Description: reports murdering his Grandfather, hurt group home staff Does patient have access to weapons?: No Criminal Charges Pending?: No Does patient have a court date: No Prior Inpatient Therapy: Prior Inpatient Therapy: Yes Prior Therapy Dates: Unknown Prior Therapy Facilty/Provider(s): [place] Reason for Treatment: Schizoaffective Disorder Prior Outpatient Therapy: Prior Outpatient Therapy: Yes Prior Therapy Dates: Ongoing Prior Therapy Facilty/Provider([facility]): [facility] Reason for Treatment: Schizoaffective Does patient have an ACCT team?: Yes Does patient have Intensive In-House Services?  :  No Does patient have [facility] services? : No Does patient have P4CC services?: No  Past Medical History:  Past Medical History:  Diagnosis Date  . Anemia    "Nacrocytic"  . Cancer [facility])    colon cancer  . Colostomy in place [facility])   . Drug abuse   . Edentulous   . GERD (gastroesophageal reflux disease)   . Hypertension   . MRSA (methicillin resistant staph aureus) culture positive   . Schizo affective schizophrenia [facility])     Past Surgical History:  Procedure Laterality Date  . arm surgery    . COLOSTOMY    . COLOSTOMY     Family History: No family history on file. Family Psychiatric  History: None known Social History:  History  Alcohol Use No     History  Drug Use  . Types: Cocaine, Marijuana    Comment: Pt denied current use    Social History   Social History  . Marital status: Single    Spouse name: N/A  . Number of children: N/A  . Years of education: N/A   Social History Main Topics  . Smoking status: Current Every Day Smoker    Packs/day: 0.50  . Smokeless tobacco: Never Used  . Alcohol use No  . Drug use:     Types: Cocaine, Marijuana     Comment: Pt denied current use  . Sexual activity: No   Other Topics Concern  . Not on file   Social History  Narrative  . No narrative on file   Additional Social History:    Allergies:   Allergies  Allergen Reactions  . Penicillins Rash    Has patient had a PCN reaction causing immediate rash, facial/tongue/throat swelling, SOB or lightheadedness with hypotension: No Has patient had a PCN reaction causing severe rash involving mucus membranes or skin necrosis: No Has patient had a PCN reaction that required hospitalization No Has patient had a PCN reaction occurring within the last 10 years: No If all of the above answers are "NO", then may proceed with Cephalosporin use.     Labs:  No results found for this or any previous visit (from the past 48 hour(s)).  Current Facility-Administered Medications   Medication Dose Route Frequency Provider Last Rate Last Dose  . amLODipine (NORVASC) tablet 10 mg  10 mg Oral Daily Gonzella Lex, MD   10 mg at 07/06/16 0849  . benztropine (COGENTIN) tablet 1 mg  1 mg Oral QHS Gonzella Lex, MD   1 mg at 07/05/16 2149  . carvedilol (COREG) tablet 25 mg  25 mg Oral BID WC Gonzella Lex, MD   25 mg at 07/06/16 0850  . cloZAPine (CLOZARIL) tablet 150 mg  150 mg Oral Daily Gonzella Lex, MD   150 mg at 07/06/16 0853  . cloZAPine (CLOZARIL) tablet 350 mg  350 mg Oral QHS Gonzella Lex, MD   350 mg at 07/05/16 2148  . desmopressin (DDAVP) tablet 0.4 mg  0.4 mg Oral QHS Gonzella Lex, MD   0.4 mg at 07/05/16 2149  . folic acid (FOLVITE) tablet 2 mg  2 mg Oral Daily Gonzella Lex, MD   2 mg at 07/06/16 0848  . haloperidol (HALDOL) tablet 5 mg  5 mg Oral Q6H PRN Gonzella Lex, MD   5 mg at 07/05/16 1933  . sertraline (ZOLOFT) tablet 100 mg  100 mg Oral QHS Gonzella Lex, MD   100 mg at 07/05/16 2148  . traZODone (DESYREL) tablet 100 mg  100 mg Oral QHS Gonzella Lex, MD   100 mg at 07/05/16 2147   Current Outpatient Prescriptions  Medication Sig Dispense Refill  . amLODipine (NORVASC) 10 MG tablet Take 1 tablet (10 mg total) by mouth at bedtime. 30 tablet 0  . benztropine (COGENTIN) 1 MG tablet Take 1 tablet (1 mg total) by mouth 2 (two) times daily. (Patient taking differently: Take 1 mg by mouth at bedtime. ) 10 tablet 0  . carvedilol (COREG) 25 MG tablet Take 1 tablet (25 mg total) by mouth 2 (two) times daily with a meal. 60 tablet 0  . cloZAPine (CLOZARIL) 100 MG tablet Take 150-350 mg by mouth 2 (two) times daily. 150mg  in the morning and take 350mg  at bedtime    . folic acid (FOLVITE) 1 MG tablet Take 2 tablets (2 mg total) by mouth daily. 30 tablet 0  . haloperidol (HALDOL) 10 MG tablet Take 0.5-1 tablets (5-10 mg total) by mouth 3 (three) times daily. Take 5mg  in the morning, take 10mg  by mouth at 2:00pm, and at bedtime. 30 tablet 0  . haloperidol  (HALDOL) 5 MG tablet Take 10 mg by mouth 2 (two) times daily as needed for agitation (Also for aggressive impulses and voices).     . traZODone (DESYREL) 100 MG tablet Take 100 mg by mouth at bedtime as needed for sleep.    Marland Kitchen acetaminophen (TYLENOL) 325 MG tablet Take 2 tablets (650 mg  total) by mouth every 6 (six) hours as needed for mild pain (or Fever >/= 101). (Patient not taking: Reported on 07/04/2016)    . desmopressin (DDAVP) 0.2 MG tablet Take 2 tablets (0.4 mg total) by mouth at bedtime. (Patient not taking: Reported on 07/04/2016) 30 tablet 0  . ipratropium (ATROVENT) 0.03 % nasal spray Place 2 sprays into both nostrils every 12 (twelve) hours. (Patient not taking: Reported on 07/04/2016) 30 mL 12  . sertraline (ZOLOFT) 100 MG tablet Take 1 tablet (100 mg total) by mouth at bedtime. (Patient not taking: Reported on 07/04/2016) 30 tablet 0    Musculoskeletal: Strength & Muscle Tone: within normal limits Gait & Station: normal Patient leans: N/A  Psychiatric Specialty Exam: Physical Exam  Nursing note and vitals reviewed. Constitutional: He appears well-developed and well-nourished.  HENT:  Head: Normocephalic and atraumatic.  Eyes: Conjunctivae are normal. Pupils are equal, round, and reactive to light.  Neck: Normal range of motion.  Cardiovascular: Normal heart sounds.   Respiratory: Effort normal.  GI: Soft.    Musculoskeletal: Normal range of motion.  Neurological: He is alert.  Skin: Skin is warm and dry.  Psychiatric: His affect is blunt. His speech is delayed. He is slowed. Thought content is paranoid and delusional. Cognition and memory are impaired. He expresses impulsivity. He expresses no suicidal ideation.    Review of Systems  Constitutional: Negative.   HENT: Negative.   Eyes: Negative.   Respiratory: Negative.   Cardiovascular: Negative.   Gastrointestinal: Negative.   Musculoskeletal: Negative.   Skin: Negative.   Neurological: Negative.     Psychiatric/Behavioral: Positive for hallucinations. Negative for depression, memory loss, substance abuse and suicidal ideas. The patient is nervous/anxious. The patient does not have insomnia.     Blood pressure (!) 165/103, pulse 99, temperature 97.9 F (36.6 C), temperature source Oral, resp. rate 20, SpO2 100 %.There is no height or weight on file to calculate BMI.  General Appearance: Casual  Eye Contact:  Minimal  Speech:  Slow  Volume:  Decreased  Mood:  Euthymic  Affect:  Blunt  Thought Process:  Goal Directed  Orientation:  Full (Time, Place, and Person)  Thought Content:  Delusions and Paranoid Ideation  Suicidal Thoughts:  No  Homicidal Thoughts:  No  Memory:  Immediate;   Fair Recent;   Poor Remote;   Fair  Judgement:  Impaired  Insight:  Shallow  Psychomotor Activity:  Decreased  Concentration:  Concentration: Fair  Recall:  [employer] of Knowledge:  Fair  Language:  Fair  Akathisia:  No  Handed:  Right  AIMS (if indicated):     Assets:  Warehouse manager Resources/Insurance  ADL's:  Impaired  Cognition:  WNL  Sleep:        Treatment Plan Summary: Patient's behavior is still up and down and often inappropriate. There is hard to imagine managing him outside of a hospital setting right now. Continue current medicine and continue referral to state hospital. Disposition: Recommend psychiatric Inpatient admission when medically cleared. Supportive therapy provided about ongoing stressors.  Alethia Berthold, MD 07/06/2016 5:31 PM

## 2016-07-06 NOTE — BH Assessment (Signed)
Patient referred to [facility], pending review.    State Referral Form and supporting documentation completed and faxed to [employer].   Received the Authorization/Tracking # [id number]) from [place] ([id number]).   Verbal screening completed with [facility]([phone]), information faxed and confirmed it was received.   Pending Review

## 2016-07-06 NOTE — ED Notes (Signed)
Pt awake, seems to be oriented but unable to stick on one topic during conversation. Pt has not been physically aggressive but has been verbally challenging and will invade personal space. Compliant with meds so far. Oob in room and to toilet.

## 2016-07-06 NOTE — ED Provider Notes (Signed)
-----------------------------------------   10:07 AM on 07/06/2016 -----------------------------------------   Blood pressure (!) 165/103, pulse 99, temperature 97.9 F (36.6 C), temperature source Oral, resp. rate 20, SpO2 100 %.  The patient had no acute events since last update.  Calm and cooperative at this time.  Disposition is pending Psychiatry/Behavioral Medicine team recommendations.     Orbie Pyo, MD 07/06/16 1007

## 2016-07-06 NOTE — ED Notes (Signed)
Pt given meal tray and juice. 

## 2016-07-06 NOTE — ED Notes (Signed)
Pt given cup of orange juice

## 2016-07-07 DIAGNOSIS — F259 Schizoaffective disorder, unspecified: Secondary | ICD-10-CM | POA: Diagnosis not present

## 2016-07-07 MED ORDER — LORAZEPAM 2 MG PO TABS
2.0000 mg | ORAL_TABLET | Freq: Once | ORAL | Status: AC
  Administered 2016-07-07: 2 mg via ORAL
  Filled 2016-07-07: qty 1

## 2016-07-07 NOTE — ED Notes (Signed)
Patient in bathroom at this time.

## 2016-07-07 NOTE — ED Notes (Signed)
BEHAVIORAL HEALTH ROUNDING Patient sleeping: No. Patient alert and oriented:  Oriented to self Behavior appropriate: Yes.  ; If no, describe:  Nutrition and fluids offered: yes Toileting and hygiene offered: Yes  Sitter present: q15 minute observations and security monitoring Law enforcement present: Yes  ODS

## 2016-07-07 NOTE — ED Notes (Signed)
He is awake and sitting up on the mattress folding a pillowcase  NAD observed  I introduced myself  He ambulated to the BR

## 2016-07-07 NOTE — ED Provider Notes (Signed)
-----------------------------------------   7:19 AM on 07/07/2016 -----------------------------------------   Blood pressure (!) 134/92, pulse 92, temperature 98.4 F (36.9 C), temperature source Oral, resp. rate 15, SpO2 100 %.  No acute events overnight. The patient has been seen and evaluated by psychiatry, they continue to recommend inpatient admission. Currently exploring options for further treatment. Medical workup has been largely nonrevealing.     Harvest Dark, MD 07/07/16 [phone]

## 2016-07-07 NOTE — BH Assessment (Signed)
Confirmed with [facility] ([phone]), patient remain on their Wait List.

## 2016-07-07 NOTE — ED Notes (Signed)
BEHAVIORAL HEALTH ROUNDING Patient sleeping: No. Patient alert and oriented:  Oriented to self  Behavior appropriate: Yes.  ; If no, describe:  Nutrition and fluids offered: yes Toileting and hygiene offered: Yes  Sitter present: q15 minute observations and security monitoring Law enforcement present: Yes  ODS

## 2016-07-07 NOTE — ED Notes (Signed)
Patient refused to come out of bathroom patient in bathroom yelling and cursing

## 2016-07-07 NOTE — ED Notes (Signed)
IVC/  PENDING  PLACEMENT 

## 2016-07-07 NOTE — ED Notes (Signed)
Patient in bathroom

## 2016-07-07 NOTE — ED Notes (Signed)
Patient in bathroom trying to flush toilet paper down the  toilet

## 2016-07-07 NOTE — ED Notes (Signed)
Am meds administered as ordered - some early due to pt request  Assessment completed  Pt continues to have a mattress with sheets and one blanket on the floor due to his actions in the last 24 hours of taking off his colostomy and smearing feces around the room and all over the bedframe  He is in NAD at this time   Answering question appropriately  Oriented to self  I told him he is in the ED at [facility] in [place]  He appeared relieved   Speaking at times of his father "I remember what happened to my father - I saw it - I cannot talk about it because it makes me mad.  My momma stayed int he kitchen all the time but wow - she can cook."

## 2016-07-07 NOTE — ED Notes (Signed)
ED  Is the patient under IVC or is there intent for IVC: Yes.   Is the patient medically cleared: Yes.   Is there vacancy in the ED BHU: Yes.   Is the population mix appropriate for patient: Yes.   Is the patient awaiting placement in inpatient or outpatient setting: Yes.   Has the patient had a psychiatric consult: Yes with daily reassessments  Survey of unit performed for contraband, proper placement and condition of furniture, tampering with fixtures in bathroom, shower, and each patient room: Yes.  ; Findings:  APPEARANCE/BEHAVIOR Calm and cooperative NEURO ASSESSMENT Orientation: oriented to self   Denies pain Hallucinations: No.None noted (Hallucinations) Speech: Normal Gait: normal RESPIRATORY ASSESSMENT Even  Unlabored respirations  CARDIOVASCULAR ASSESSMENT Pulses equal   regular rate  Skin warm and dry   GASTROINTESTINAL ASSESSMENT no GI complaint EXTREMITIES Full ROM  PLAN OF CARE Provide calm/safe environment. Vital signs assessed twice daily. ED BHU Assessment once each 12-hour shift. Collaborate with TTS daily or as condition indicates. Assure the ED provider has rounded once each shift. Provide and encourage hygiene. Provide redirection as needed. Assess for escalating behavior; address immediately and inform ED provider.  Assess family dynamic and appropriateness for visitation as needed: Yes.  ; If necessary, describe findings:  Educate the patient/family about BHU procedures/visitation: Yes.  ; If necessary, describe findings:

## 2016-07-07 NOTE — ED Notes (Signed)
Patient in room placing blankets on floor walking around the corners of each blanket

## 2016-07-07 NOTE — ED Notes (Signed)
BEHAVIORAL HEALTH ROUNDING Patient sleeping: No. Patient alert and oriented:  Oriented to self  Behavior appropriate: Yes.  ; If no, describe:  Nutrition and fluids offered: yes  Water and milk  Toileting and hygiene offered: Yes  Sitter present: q15 minute observations and security  monitoring Law enforcement present: Yes  ODS   ENVIRONMENTAL ASSESSMENT Potentially harmful objects out of patient reach: Yes.   Personal belongings secured: Yes.   Patient dressed in hospital provided attire only: Yes.   Plastic bags out of patient reach: Yes.   Patient care equipment (cords, cables, call bells, lines, and drains) shortened, removed, or accounted for: Yes.   Equipment and supplies removed from bottom of stretcher: Yes.   Potentially toxic materials out of patient reach: Yes.   Sharps container removed or out of patient reach: Yes.

## 2016-07-07 NOTE — ED Notes (Signed)
Patient in hallway refusing to go back in room.

## 2016-07-08 DIAGNOSIS — F259 Schizoaffective disorder, unspecified: Secondary | ICD-10-CM | POA: Diagnosis not present

## 2016-07-08 MED ORDER — LORAZEPAM 2 MG PO TABS
2.0000 mg | ORAL_TABLET | Freq: Once | ORAL | Status: AC
  Administered 2016-07-08 – 2016-07-09 (×2): 2 mg via ORAL

## 2016-07-08 MED ORDER — LORAZEPAM 2 MG PO TABS
ORAL_TABLET | ORAL | Status: AC
  Administered 2016-07-08: 2 mg via ORAL
  Filled 2016-07-08: qty 1

## 2016-07-08 NOTE — ED Notes (Signed)
Pt undressing self and other attention seeking activities (water in BR trash, toilet paper in water, throwing blankets in trash, malingering and manipulating

## 2016-07-08 NOTE — ED Notes (Signed)
Pt. Has no stretcher in room.  Pt. Has mattress on floor.

## 2016-07-08 NOTE — ED Provider Notes (Signed)
-----------------------------------------   7:33 AM on 07/08/2016 -----------------------------------------   Blood pressure (!) 136/92, pulse (!) 113, temperature 98 F (36.7 C), temperature source Oral, resp. rate 18, SpO2 100 %.  The patient had no acute events since last update.  Calm and cooperative at this time.  Disposition is pending Psychiatry/Behavioral Medicine team recommendations.     Merlyn Lot, MD 07/08/16 [phone]

## 2016-07-08 NOTE — ED Notes (Signed)
Patient urinated on self. Assisted nurse in cleaning patient, and changing bed linen. AS

## 2016-07-08 NOTE — ED Notes (Signed)
Pt threw roll of toilet paper into toilet, pt was instructed to remove the roll, pt did without further instruction.

## 2016-07-08 NOTE — ED Notes (Signed)
Lunch tray given to patient with juice.

## 2016-07-08 NOTE — ED Notes (Signed)
Emptied ostomy bag for patient.

## 2016-07-08 NOTE — ED Notes (Signed)
Colostomy bag and backing 2.25" changed

## 2016-07-08 NOTE — ED Notes (Signed)
Pt colostomy bag changed

## 2016-07-08 NOTE — ED Notes (Signed)
PT GIVEN A CUP OF SPRITE PER REQUEST.

## 2016-07-08 NOTE — ED Notes (Signed)
Pt undressing, acts feeble but then displays tremendous dexterity and strength, appears to enjoy attention from malingering

## 2016-07-08 NOTE — ED Notes (Signed)
Ostomy bag changed for patient, pt tol well

## 2016-07-08 NOTE — ED Notes (Signed)
Assisted patient with fresh set of scrubs and assisted RN Ena Dawley with changing of colostomy bag and got patient new pull up on and couple warm blankets. Patient resting quietly now

## 2016-07-08 NOTE — BH Assessment (Signed)
Confirmed with [facility] ([phone]), patient remain on their Wait List.

## 2016-07-09 DIAGNOSIS — F259 Schizoaffective disorder, unspecified: Secondary | ICD-10-CM | POA: Diagnosis not present

## 2016-07-09 MED ORDER — LORAZEPAM 2 MG PO TABS
2.0000 mg | ORAL_TABLET | Freq: Once | ORAL | Status: AC
  Administered 2016-07-11: 2 mg via ORAL
  Filled 2016-07-09: qty 1

## 2016-07-09 MED ORDER — LORAZEPAM 2 MG PO TABS
ORAL_TABLET | ORAL | Status: AC
  Administered 2016-07-09: 2 mg via ORAL
  Filled 2016-07-09: qty 1

## 2016-07-09 NOTE — ED Notes (Signed)
Pt given cup of water 

## 2016-07-09 NOTE — ED Notes (Signed)
Patient is IVC and is pending placement. 

## 2016-07-09 NOTE — ED Notes (Signed)
Colostomy emptied of gas and liquid brown stool.

## 2016-07-09 NOTE — ED Notes (Signed)
Pt lying in room quietly at this time

## 2016-07-09 NOTE — ED Notes (Signed)
Pt given sprite 

## 2016-07-09 NOTE — ED Notes (Signed)
Pt urinated on self and bed. Bed linens changed and pt assisted to change into clean burgundy scrubs. Pt given sandwich tray and sprite.

## 2016-07-09 NOTE — ED Notes (Signed)
Report from amber, rn.  

## 2016-07-09 NOTE — ED Provider Notes (Signed)
-----------------------------------------   7:46 AM on 07/09/2016 -----------------------------------------   Blood pressure 112/81, pulse 98, temperature 97.6 F (36.4 C), temperature source Oral, resp. rate 18, SpO2 100 %.  The patient had no acute events since last update.  Calm and cooperative at this time.  Disposition is pending per Psychiatry/Behavioral Medicine team recommendations.     Rudene Re, MD 07/09/16 [phone]

## 2016-07-09 NOTE — ED Notes (Signed)
Changed ostomy bag on patient.  Pt. Cleaned up and changed.

## 2016-07-09 NOTE — ED Notes (Signed)
Pt given meal tray and juice. 

## 2016-07-09 NOTE — ED Notes (Signed)
Patient pulled colostomy bag off of self and got stool everywhere in room. RN Jinny Blossom placed colostomy bag back on patient in bathroom where clothes were changed by Archivist and [employer]

## 2016-07-09 NOTE — BH Assessment (Signed)
Confirmed with [facility]([phone]), patientremain on their WaitList.

## 2016-07-09 NOTE — ED Notes (Signed)
Pt to restroom without staff assistance

## 2016-07-09 NOTE — ED Notes (Signed)
Patient has large area of drool on his blanket. Will not let us take the sheet and give him a new one. Is demanding soda right now. Told by Officer that we dont serve soda at 0645 in the morning and patient became beligerant.

## 2016-07-09 NOTE — ED Notes (Signed)
Pt requesting for materials to bathe himself in his room, this tech consulting with RN Advertising account planner) pt given new behavioral clothing, towels, and shampoo and soap

## 2016-07-10 DIAGNOSIS — F259 Schizoaffective disorder, unspecified: Secondary | ICD-10-CM | POA: Diagnosis not present

## 2016-07-10 MED ORDER — CARVEDILOL 25 MG PO TABS
ORAL_TABLET | ORAL | Status: AC
  Administered 2016-07-10: 25 mg via ORAL
  Filled 2016-07-10: qty 1

## 2016-07-10 MED ORDER — HALOPERIDOL 5 MG PO TABS
ORAL_TABLET | ORAL | Status: AC
  Filled 2016-07-10: qty 1

## 2016-07-10 MED ORDER — LORAZEPAM 2 MG/ML IJ SOLN: 2.0000 mg | Freq: Once | INTRAMUSCULAR | Status: DC

## 2016-07-10 NOTE — ED Notes (Signed)
Pt up to restroom.

## 2016-07-10 NOTE — BH Assessment (Signed)
Additional information about the patient's behaviors faxed to [facility] and confirmed it was received ([phone]).

## 2016-07-10 NOTE — BH Assessment (Signed)
Updated information about the patient's behavior's, while in the ER,  faxed to [facility]. Confirmed it was received (Barbara-[phone]) and patientremain on their WaitList.

## 2016-07-10 NOTE — ED Notes (Addendum)
Pt has been up and increasingly agitated over last 20 minutes. Haldol prn administered as ordered, will administer ativan if haldol does not work in next hour.

## 2016-07-10 NOTE — ED Notes (Signed)
Pt was given clothes and all shower toiletries to get a shower pt stood in bathroom and never took shower or used the  toiletries

## 2016-07-10 NOTE — ED Notes (Signed)
Pt called out of room, answered pt pt stated, "my bag is full." assuming he is talking about his colostemy bag, I explained to pt he needed to empty bag in toilet of bathroom, pt stated," I will lay in it, if it's full I am not getting up"

## 2016-07-10 NOTE — ED Provider Notes (Signed)
-----------------------------------------   6:54 AM on 07/10/2016 -----------------------------------------   Blood pressure (!) 131/92, pulse 86, temperature 98.2 F (36.8 C), temperature source Oral, resp. rate 16, SpO2 96 %.  Patient became agitated shortly before 6 AM. He was willing to take PRN calming agents. Calm and cooperative at this time.  Disposition is pending Psychiatry/Behavioral Medicine team recommendations.     Paulette Blanch, MD 07/10/16 [phone]

## 2016-07-10 NOTE — ED Notes (Signed)
Patient agitated.  Ambulated to bathroom, ripped colostomy bag off of abdomen, stripped clothes of and standing naked in the bathroom is throwing ostomy output on floor.  Patient is confused to place and time.

## 2016-07-10 NOTE — ED Notes (Signed)
Patient ambulated to [facility] and when done, came out of Bathroom and lunged toward officer, who had directed patient to return to room.  Patient returned to room.  Raised voice and used profanity.  Patient redirected and limits set. Responded well.
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
## 2016-07-10 NOTE — ED Notes (Signed)
Pt is calmer, but continues to request "what time is breakfast, what am I getting on my tray?".

## 2016-07-10 NOTE — ED Notes (Signed)
Attempted to place pt in strecher, pt states he would rather be on mattress on floor.

## 2016-07-10 NOTE — ED Notes (Signed)
colostomy bag changed..  Bas appliance changed.  Patient redressed.

## 2016-07-10 NOTE — ED Notes (Signed)
Report to jane, rn.  

## 2016-07-10 NOTE — ED Notes (Signed)
Restless.  Pacing. Asking to "speak to the highest priest"

## 2016-07-10 NOTE — ED Notes (Signed)
Colostomy bag emptied, pt has urinated on bed and self again. Pt placed in brief for incontinence. Clean bed provided, clean clothing provided. Pt back to bed and encouraged to rest.

## 2016-07-11 DIAGNOSIS — F259 Schizoaffective disorder, unspecified: Secondary | ICD-10-CM | POA: Diagnosis not present

## 2016-07-11 MED ORDER — SERTRALINE HCL 50 MG PO TABS
150.0000 mg | ORAL_TABLET | Freq: Every day | ORAL | Status: DC
  Administered 2016-07-11 – 2016-07-21 (×11): 150 mg via ORAL
  Filled 2016-07-11 (×11): qty 1

## 2016-07-11 NOTE — ED Notes (Signed)
ED  Is the patient under IVC or is there intent for IVC: Yes.   Is the patient medically cleared: Yes.   Is there vacancy in the ED BHU: Yes.   Is the population mix appropriate for patient: pt with colostomy bag  Is the patient awaiting placement in inpatient or outpatient setting: Yes.   Has the patient had a psychiatric consult: Yes.   Survey of unit performed for contraband, proper placement and condition of furniture, tampering with fixtures in bathroom, shower, and each patient room: Yes.  ; Findings:  APPEARANCE/BEHAVIOR Calm and cooperative NEURO ASSESSMENT Orientation: oriented to self reoriented to time place and situation  Denies pain Hallucinations: No.None noted (Hallucinations) Speech: Normal Gait: normal RESPIRATORY ASSESSMENT Even  Unlabored respirations  CARDIOVASCULAR ASSESSMENT Pulses equal   regular rate  Skin warm and dry   GASTROINTESTINAL ASSESSMENT no GI complaint EXTREMITIES Full ROM  PLAN OF CARE Provide calm/safe environment. Vital signs assessed twice daily. ED BHU Assessment once each 12-hour shift. Collaborate with intake TTS or as condition indicates. Assure the ED provider has rounded once each shift. Provide and encourage hygiene. Provide redirection as needed. Assess for escalating behavior; address immediately and inform ED provider.  Assess family dynamic and appropriateness for visitation as needed: Yes.  ; If necessary, describe findings:  Educate the patient/family about BHU procedures/visitation: Yes.  ; If necessary, describe findings:

## 2016-07-11 NOTE — ED Notes (Signed)
Patient in bathroom

## 2016-07-11 NOTE — ED Notes (Signed)
Pt tore colostomy bad off and was standing in room with just underwear on, pt requesting wipes to clean himself off, this tech and Rn (Amy) gave pt new clothing and wipes to clean self off. Pt lying on mattress on the floor and refused to get up telling this tech and Amy to clean him up, pt in the past is capable of performing ADL tasks on his own without assistance. Pt refused to get off mattress.

## 2016-07-11 NOTE — ED Notes (Signed)
BEHAVIORAL HEALTH ROUNDING Patient sleeping: No. Patient alert and oriented: oriented to self Behavior appropriate: Yes.  ; If no, describe:  Nutrition and fluids offered: yes Toileting and hygiene offered: Yes  Sitter present: q15 minute observations and security monitoring Law enforcement present: Yes  ODS

## 2016-07-11 NOTE — ED Notes (Signed)
Drink and snack provided.

## 2016-07-11 NOTE — ED Notes (Signed)
Pt given Coke by this tech

## 2016-07-11 NOTE — ED Notes (Addendum)
BEHAVIORAL HEALTH ROUNDING Patient sleeping: No. Patient alert and oriented: alert  Oriented to self - I have reoriented him to place time and situation  Behavior appropriate: Yes.  ; If no, describe:  Nutrition and fluids offered: yes Toileting and hygiene offered: Yes  Sitter present: q15 minute observations and security  monitoring Law enforcement present: Yes  ODS   ENVIRONMENTAL ASSESSMENT Potentially harmful objects out of patient reach: Yes.   Personal belongings secured: Yes.   Patient dressed in hospital provided attire only: Yes.   Plastic bags out of patient reach: Yes.   Patient care equipment (cords, cables, call bells, lines, and drains) shortened, removed, or accounted for: Yes.   Equipment and supplies removed from bottom of stretcher: Yes.   Potentially toxic materials out of patient reach: Yes.   Sharps container removed or out of patient reach: Yes.

## 2016-07-11 NOTE — ED Notes (Addendum)
He is currently in the BR   NAD  Steady gait as he returned to his room

## 2016-07-11 NOTE — ED Notes (Signed)
BEHAVIORAL HEALTH ROUNDING Patient sleeping: No. Patient alert and oriented: yes Behavior appropriate: Yes.  ; If no, describe:  Nutrition and fluids offered: yes Toileting and hygiene offered: Yes  Sitter present: q15 minute observations and security  monitoring Law enforcement present: Yes  ODS  

## 2016-07-11 NOTE — BH Assessment (Signed)
Confirmed with [facility]([phone]), patientremain on their WaitList.

## 2016-07-11 NOTE — ED Notes (Signed)
BEHAVIORAL HEALTH ROUNDING Patient sleeping: Yes.   Patient alert and oriented: eyes closed  Appears to be asleep Behavior appropriate: Yes.  ; If no, describe:  Nutrition and fluids offered: Yes  Toileting and hygiene offered: sleeping Sitter present: q 15 minute observations and security monitoring Law enforcement present: yes  ODS 

## 2016-07-11 NOTE — ED Notes (Signed)
Linens and clothing changed  He has taken off his colostomy bag and poured it in the floor -  He is difficult to redirect at this time  New bag and seal placed

## 2016-07-11 NOTE — ED Notes (Signed)
meds administered as ordered  Assessment completed  Sprite provided

## 2016-07-11 NOTE — ED Notes (Signed)
Pt observed standing on his own and dressing himself with the clothing provided and without any assistance

## 2016-07-11 NOTE — ED Notes (Signed)
IVC re-newed/ [place] wait list

## 2016-07-11 NOTE — ED Notes (Signed)
Colostomy area cleansed and new bag replaced  Pt given a change of clothing socks and underwear

## 2016-07-11 NOTE — ED Notes (Signed)
Pt requested a soda this morning, this tech explained to pt that soda's weren't allowed at 7:15am and that he would be allowed a soda with his breakfast, pt agreed and was provided a cup of water by this tech per RN (Amy) approval

## 2016-07-11 NOTE — ED Provider Notes (Signed)
-----------------------------------------   1:57 PM on 07/11/2016 -----------------------------------------   Blood pressure (!) 140/95, pulse 99, temperature 98 F (36.7 C), temperature source Oral, resp. rate 20, SpO2 99 %.  The patient had no acute events since last update.  Calm and cooperative at this time.  Disposition is pending Psychiatry/Behavioral Medicine team recommendations.     Carrie Mew, MD 07/11/16 1357
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
## 2016-07-11 NOTE — BH Assessment (Signed)
Writer received phone call from [facility] Readmission Coordinator [phone]) inquiring about the status of the patient. Asked questions about his current outpatient treatment provider and his behaviors in the ER.

## 2016-07-12 DIAGNOSIS — F259 Schizoaffective disorder, unspecified: Secondary | ICD-10-CM | POA: Diagnosis not present

## 2016-07-12 NOTE — ED Notes (Signed)
BEHAVIORAL HEALTH ROUNDING Patient sleeping: No. Patient alert and oriented: alert - oriented to self, place and day Behavior appropriate: Yes.  ; If no, describe:  Nutrition and fluids offered: yes Toileting and hygiene offered: Yes  Sitter present: q15 minute observations and security monitoring Law enforcement present: Yes  ODS

## 2016-07-12 NOTE — ED Provider Notes (Signed)
  Physical Exam  BP (!) 153/96   Pulse 99   Temp 98.4 F (36.9 C) (Oral)   Resp 19   SpO2 100%   Physical Exam  ED Course  Procedures  MDM Patient was agitated last night, given meds. Still pending psych placement        Drenda Freeze, MD 07/12/16 [phone]

## 2016-07-12 NOTE — ED Notes (Signed)
Standing in the doorway of the BR

## 2016-07-12 NOTE — ED Notes (Signed)
Ambulating to the BR

## 2016-07-12 NOTE — ED Notes (Signed)
PT IVC ON [place] WAITING LIST

## 2016-07-12 NOTE — ED Notes (Signed)
ED  Is the patient under IVC or is there intent for IVC: Yes.   Is the patient medically cleared: Yes.   Is there vacancy in the ED BHU: Yes.   Is the population mix appropriate for patient: Yes.  He has a colostomy  Is the patient awaiting placement in inpatient or outpatient setting: Yes.  [place]  Has the patient had a psychiatric consult: Yes.   Survey of unit performed for contraband, proper placement and condition of furniture, tampering with fixtures in bathroom, shower, and each patient room: Yes.  ; Findings:  APPEARANCE/BEHAVIOR Calm and cooperative NEURO ASSESSMENT Orientation: oriented to self place and day  Denies pain Hallucinations: No.None noted (Hallucinations) Speech: Normal Gait: normal RESPIRATORY ASSESSMENT Even  Unlabored respirations  CARDIOVASCULAR ASSESSMENT Pulses equal   regular rate  Skin warm and dry   GASTROINTESTINAL ASSESSMENT no GI complaint EXTREMITIES Full ROM  PLAN OF CARE Provide calm/safe environment. Vital signs assessed twice daily. ED BHU Assessment once each 12-hour shift. Collaborate with daily or as condition indicates. Assure the ED provider has rounded once each shift. Provide and encourage hygiene. Provide redirection as needed. Assess for escalating behavior; address immediately and inform ED provider.  Assess family dynamic and appropriateness for visitation as needed: Yes.  ; If necessary, describe findings:  Educate the patient/family about BHU procedures/visitation: Yes.  ; If necessary, describe findings:

## 2016-07-12 NOTE — ED Notes (Signed)
Ambulating to and from the BR

## 2016-07-12 NOTE — ED Notes (Signed)
BEHAVIORAL HEALTH ROUNDING Patient sleeping: No. Patient alert and oriented:  Oriented to self place and day  Behavior appropriate: Yes.  ; If no, describe:  Nutrition and fluids offered: yes Toileting and hygiene offered: Yes  Sitter present: q15 minute observations and security monitoring Law enforcement present: Yes  ODS  ENVIRONMENTAL ASSESSMENT Potentially harmful objects out of patient reach: Yes.   Personal belongings secured: Yes.   Patient dressed in hospital provided attire only: Yes.   Plastic bags out of patient reach: Yes.   Patient care equipment (cords, cables, call bells, lines, and drains) shortened, removed, or accounted for: Yes.   Equipment and supplies removed from bottom of stretcher: Yes.   Potentially toxic materials out of patient reach: Yes.   Sharps container removed or out of patient reach: Yes.

## 2016-07-12 NOTE — ED Notes (Signed)
Pt given breakfast tray. Pt ambulated to bathroom. Pt carrying on conversations with self.

## 2016-07-12 NOTE — ED Notes (Signed)
Ambulating to the BR  Commode can be heard flushing multiple - multiple times  Pt informed he needed to retunr to his room

## 2016-07-12 NOTE — ED Notes (Signed)
Linens, clothing and colostomy changed and cleansed  meds administered as ordered  Drink provided

## 2016-07-12 NOTE — ED Notes (Signed)
Pt folding his blankets - he spoke - "good morning" as did I  -    Pt visualized with NAD  No verbalized needs or concerns at this time  Continue to monitor

## 2016-07-12 NOTE — ED Notes (Addendum)
Assessment completed   Am meds administered as ordered colostomy bag changed along with his clothing and new linens provided

## 2016-07-12 NOTE — ED Notes (Signed)
Ambulating to the BR and then standing in the doorway

## 2016-07-12 NOTE — ED Notes (Signed)
Ambulating to and from the BR  - redirection

## 2016-07-12 NOTE — ED Notes (Signed)
Ambulating back to and from BR

## 2016-07-12 NOTE — ED Notes (Signed)
BEHAVIORAL HEALTH ROUNDING Patient sleeping: No. Patient alert and oriented: oriented to self place Behavior appropriate: Yes.  ; If no, describe:  Nutrition and fluids offered: yes Toileting and hygiene offered: Yes  Sitter present: q15 minute observations and security monitoring Law enforcement present: Yes  ODS

## 2016-07-12 NOTE — ED Notes (Signed)
BEHAVIORAL HEALTH ROUNDING Patient sleeping: No. Patient alert and oriented: yes Behavior appropriate: Yes.  ; If no, describe:  Nutrition and fluids offered: yes Toileting and hygiene offered: Yes  Sitter present: q15 minute observations and security  monitoring Law enforcement present: Yes  ODS  

## 2016-07-12 NOTE — ED Notes (Signed)
Pt. Increasingly agitated along with patient across from room. Redirected.

## 2016-07-12 NOTE — ED Notes (Signed)
BEHAVIORAL HEALTH ROUNDING Patient sleeping: No. Patient alert and oriented: oriented to self, place and day Behavior appropriate: Yes.  ; If no, describe:  Nutrition and fluids offered: yes Toileting and hygiene offered: Yes  Sitter present: q15 minute observations and security  monitoring Law enforcement present: Yes  ODS

## 2016-07-12 NOTE — ED Notes (Signed)
BEHAVIORAL HEALTH ROUNDING Patient sleeping: No. Patient alert and oriented: oriented to self, place and day Behavior appropriate: Yes.  ; If no, describe:  Nutrition and fluids offered: yes Toileting and hygiene offered: Yes  Sitter present: q15 minute observations and security monitoring Law enforcement present: Yes  ODS

## 2016-07-13 DIAGNOSIS — F259 Schizoaffective disorder, unspecified: Secondary | ICD-10-CM | POA: Diagnosis not present

## 2016-07-13 LAB — CBC WITH DIFFERENTIAL/PLATELET
BASOS ABS: 0 10*3/uL (ref 0–0.1)
Basophils Relative: 0 %
EOS PCT: 3 %
Eosinophils Absolute: 0.2 10*3/uL (ref 0–0.7)
HEMATOCRIT: 34.3 % — AB (ref 40.0–52.0)
Hemoglobin: 11.5 g/dL — ABNORMAL LOW (ref 13.0–18.0)
LYMPHS ABS: 2.1 10*3/uL (ref 1.0–3.6)
LYMPHS PCT: 29 %
MCH: 31.7 pg (ref 26.0–34.0)
MCHC: 33.5 g/dL (ref 32.0–36.0)
MCV: 94.5 fL (ref 80.0–100.0)
MONO ABS: 0.6 10*3/uL (ref 0.2–1.0)
MONOS PCT: 8 %
NEUTROS ABS: 4.4 10*3/uL (ref 1.4–6.5)
Neutrophils Relative %: 60 %
PLATELETS: 318 10*3/uL (ref 150–440)
RBC: 3.63 MIL/uL — ABNORMAL LOW (ref 4.40–5.90)
RDW: 15.4 % — AB (ref 11.5–14.5)
WBC: 7.2 10*3/uL (ref 3.8–10.6)

## 2016-07-13 NOTE — ED Provider Notes (Signed)
-----------------------------------------   7:33 AM on 07/13/2016 -----------------------------------------   Blood pressure (!) 128/95, pulse (!) 117, temperature 98.2 F (36.8 C), temperature source Oral, resp. rate 20, SpO2 100 %.  The patient had no acute events since last update.  Calm and cooperative at this time.  Disposition is pending Psychiatry/Behavioral Medicine team recommendations.     Daymon Larsen, MD 07/13/16 [phone]

## 2016-07-13 NOTE — ED Notes (Signed)
Informed patient that if he took off his colostomy bag it puts himself at risk for infection.  I told him not to take off the bag at all and if he needed help emptying to let me know. Pt frequently urinating in trash can in bathroom as well as throwing away colostomy bag into trash can.  Trash can removed from the bathroom.

## 2016-07-13 NOTE — ED Notes (Signed)
Pt observed lying on mattress with no colostomy bag on, stool on pt blanket. This tech, Rn Verline Lema, Rn David cleaned pt up and given new behavioral clothing

## 2016-07-13 NOTE — ED Notes (Signed)
Pt continues to yell out for the nurse, kicking the door in the room. Pt has been told numerous times to stop this behavior. Pt took haldol and coreg without incident, colostomy bag intact at this time. Pt also requesting to have orange juice, informed patient that if his behavior continues he cannot have any orange juice with his breakfast. Pt also has destroyed a pillow this morning which had to be thrown in the trash. Pt states he wants to go home.

## 2016-07-13 NOTE — ED Notes (Signed)
BEHAVIORAL HEALTH ROUNDING  PATIENT SLEEPING: No Patient alert & oriented: Yes Behavior appropriate: Yes  Describe behavior: No inappropriate or unacceptable behaviors noted at this time. Nutrition & fluids offered: Yes Toileting & Hygiene offered: Yes Sitter present: Behavioral tech rounding every 15 minutes on patient to ensure safety. Law enforcement present: Yes Event organiser agency: [place] (ODS)

## 2016-07-13 NOTE — ED Notes (Addendum)
Patient in room shaking and banging on the garage door

## 2016-07-13 NOTE — ED Notes (Signed)
Patient banging elbows on wall

## 2016-07-13 NOTE — ED Notes (Signed)
Adjusted pt. Ostomy bag, pt. Comfortable.

## 2016-07-13 NOTE — ED Notes (Signed)
Patient found without colostomy bag attached, a new one was placed.

## 2016-07-13 NOTE — ED Notes (Signed)
IVC/Remains on [facility] waitlist

## 2016-07-13 NOTE — ED Notes (Signed)

## 2016-07-13 NOTE — ED Notes (Signed)
Pt. Up using bathroom. 

## 2016-07-13 NOTE — Consult Note (Signed)
Pt ordered clozapine- pt is enrolled in [facility] program and is eligible. Outpatient labs q 4 weeks- inpatient labs q weekly. Last lab today 07/13/16. Submitted to REM program. Next lab due 1/16 while inpatient.  Ulice Dash, PharmD Clinical Pharmacist

## 2016-07-13 NOTE — ED Notes (Signed)
Pt. Using bathroom.  Pt. Linens changed.

## 2016-07-13 NOTE — ED Notes (Signed)
Changed pt. Ostomy bag.  Pt. Changed into new scrub bottoms and underwear.

## 2016-07-14 DIAGNOSIS — F259 Schizoaffective disorder, unspecified: Secondary | ICD-10-CM | POA: Diagnosis not present

## 2016-07-14 NOTE — ED Notes (Signed)

## 2016-07-14 NOTE — ED Notes (Signed)
Assisted pt to ambulate to the bathroom to empty colostomy bag; clean peri area; change hospital gown and pants; pt able to complete tasks with verbal direction and minimal assistance. Pt at first denied being able to stand up and complete tasks without complete assistance; with verbal prompting, pt is able to complete tasks with minimal physical assistance; pt requires constant redirection to complete task at hand; pt is cooperative and non-combative.

## 2016-07-14 NOTE — ED Notes (Signed)
PT IVC/ PENDING PLACEMENT  

## 2016-07-14 NOTE — ED Notes (Signed)
Patient was giving wipes to clean himself patient had stool on hands ,and stomach, collect all things back from him.

## 2016-07-14 NOTE — ED Notes (Addendum)

## 2016-07-14 NOTE — ED Notes (Signed)
Pt pulled off colostomy bag and attempted to flush down toilet, colostomy bag replaced

## 2016-07-14 NOTE — ED Notes (Signed)

## 2016-07-14 NOTE — ED Notes (Signed)
Walked with patient to bathroom , and  Walked patient back to room

## 2016-07-14 NOTE — Progress Notes (Signed)
LCSW called [facility] and spoke to [place] and then [facility].. Currently there are no D/c today so its is unlikely patient will get a bed offer this weekend. [place] staff will contact CSW if this changes over the weekend.  [employer] LCSW [phone]

## 2016-07-14 NOTE — ED Notes (Signed)
Patient in room laughing out and yelling.

## 2016-07-14 NOTE — ED Notes (Signed)
Patient yelling and banging on the wall

## 2016-07-14 NOTE — ED Notes (Signed)
Pt in room talking to wife he explains to RN,( no one in room) pt frequently yelling out, confused at times, oriented to person, alert

## 2016-07-14 NOTE — ED Provider Notes (Signed)
-----------------------------------------   6:23 AM on 07/14/2016 -----------------------------------------   Blood pressure (!) 149/110, pulse (!) 106, temperature 98.3 F (36.8 C), temperature source Oral, resp. rate 20, SpO2 100 %.  The patient had no acute events since last update.  Calm and cooperative at this time.  Disposition is pending Psychiatry/Behavioral Medicine team recommendations.     Paulette Blanch, MD 07/14/16 [phone]

## 2016-07-14 NOTE — ED Notes (Signed)
Patient ate 100% breakfast.

## 2016-07-14 NOTE — ED Notes (Signed)
Lunch was given to patient 

## 2016-07-14 NOTE — ED Notes (Signed)
Patient in room banging on the wall

## 2016-07-15 DIAGNOSIS — F259 Schizoaffective disorder, unspecified: Secondary | ICD-10-CM | POA: Diagnosis not present

## 2016-07-15 NOTE — ED Notes (Signed)
BEHAVIORAL HEALTH ROUNDING Patient sleeping: No. Patient alert and oriented: not applicable Behavior appropriate: Yes.  ; If no, describe:  Nutrition and fluids offered: Yes  Toileting and hygiene offered: Yes  Sitter present: not applicable Law enforcement present: Yes  

## 2016-07-15 NOTE — ED Notes (Signed)
BEHAVIORAL HEALTH ROUNDING Patient sleeping: Yes.   Patient alert and oriented: not applicable Behavior appropriate: Yes.  ; If no, describe:  Nutrition and fluids offered: Yes  Toileting and hygiene offered: Yes  Sitter present: not applicable Law enforcement present: Yes

## 2016-07-15 NOTE — ED Notes (Signed)
BEHAVIORAL HEALTH ROUNDING  Patient sleeping: No.  Patient alert and oriented: yes  Behavior appropriate: Yes. ; If no, describe:  Nutrition and fluids offered: Yes  Toileting and hygiene offered: Yes  Sitter present: not applicable, Q 15 min safety rounds and observation.  Law enforcement present: Yes ODS  

## 2016-07-15 NOTE — ED Notes (Addendum)
BEHAVIORAL HEALTH ROUNDING  Patient sleeping: No.  Patient alert and oriented: yes  Behavior appropriate: Yes. ; If no, describe:  Nutrition and fluids offered: Yes  Toileting and hygiene offered: Yes  Sitter present: not applicable, Q 15 min safety rounds and observation.  Law enforcement present: Yes ODS  ED [place]  Is the patient under IVC or is there intent for IVC: Yes.  Is the patient medically cleared: Yes.  Is there vacancy in the ED BHU: Yes.  Is the population mix appropriate for patient: No Is the patient awaiting placement in inpatient or outpatient setting: Yes.  Has the patient had a psychiatric consult: Yes.  Survey of unit performed for contraband, proper placement and condition of furniture, tampering with fixtures in bathroom, shower, and each patient room: Yes. ; Findings: All clear  APPEARANCE/BEHAVIOR  calm, cooperative and adequate rapport can be established  NEURO ASSESSMENT  Orientation:  place and person per his baseline Hallucinations: Pt responding at times to internal stimuli Speech: Normal  Gait: normal  RESPIRATORY ASSESSMENT  WNL  CARDIOVASCULAR ASSESSMENT  WNL  GASTROINTESTINAL ASSESSMENT  WNL  EXTREMITIES  WNL  PLAN OF CARE  Provide calm/safe environment. Vital signs assessed TID. ED BHU Assessment once each 12-hour shift. Collaborate with TTS daily or as condition indicates. Assure the ED provider has rounded once each shift. Provide and encourage hygiene. Provide redirection as needed. Assess for escalating behavior; address immediately and inform ED provider.  Assess family dynamic and appropriateness for visitation as needed: Yes. ; If necessary, describe findings:  Educate the patient/family about BHU procedures/visitation: Yes. ; If necessary, describe findings: Pt is calm and cooperative at this time. Will continue to monitor with Q 15 min safety rounds and observation.

## 2016-07-15 NOTE — ED Notes (Signed)
Patient given couple packs of saltines and peanut butter with orange juice for snack

## 2016-07-15 NOTE — ED Notes (Signed)
Pt found urinating in sink again, when taken to bathroom pt reports "I have to go" and attempted to urinate in THAT sink, pt directed to toilet, pt states "I don't have to go anymore", pt removed colostomy bag and waifer in the bed, new bag applied, pt and bed cleaned and changed  Pt continues to request for staff to clean self, or even to help standing, when asked to be independent pt will do own ADLs and apologize and say "I'm sorry boss"

## 2016-07-15 NOTE — ED Notes (Signed)
Patient has pulled colostomy bag off x 4 today, cleaned and replaced as needed. Patient continues delusional but not aggressive at this time.

## 2016-07-15 NOTE — ED Notes (Signed)
Pt given clean behavioral scrubs to change into due to soiling the previous ones.

## 2016-07-15 NOTE — ED Notes (Signed)
Pt at all of his dinner tray.

## 2016-07-15 NOTE — ED Notes (Signed)
BEHAVIORAL HEALTH ROUNDING Patient sleeping: No. Patient alert and oriented: not applicable Behavior appropriate: No.; If no, describe:  Nutrition and fluids offered: Yes  Toileting and hygiene offered: Yes  Sitter present: not applicable Law enforcement present: Yes

## 2016-07-15 NOTE — ED Provider Notes (Signed)
-----------------------------------------   7:33 AM on 07/15/2016 -----------------------------------------   Blood pressure (!) 142/93, pulse 69, temperature 98.3 F (36.8 C), temperature source Oral, resp. rate 18, SpO2 100 %.  The patient had no acute events since last update.  Calm and cooperative at this time.  Disposition is pending Psychiatry/Behavioral Medicine team recommendations.     Schuyler Amor, MD 07/15/16 [phone]

## 2016-07-15 NOTE — ED Notes (Signed)
Pt soiled behavioral scrubs, pt given clean behavioral scrubs to change into.

## 2016-07-15 NOTE — ED Notes (Signed)
Patient pointing at people and making hand gestures of breaking something. Coming out of room and yelling. Med po for calming.

## 2016-07-15 NOTE — ED Notes (Signed)

## 2016-07-15 NOTE — ED Notes (Addendum)
BEHAVIORAL HEALTH ROUNDING Patient sleeping: Yes.   Patient alert and oriented: not applicable SLEEPING Behavior appropriate: Yes.  ; If no, describe: SLEEPING Nutrition and fluids offered: No SLEEPING Toileting and hygiene offered: NoSLEEPING Sitter present: not applicable, Q 15 min safety rounds and observation. Law enforcement present: Yes ODS  ENVIRONMENTAL ASSESSMENT  Potentially harmful objects out of patient reach: Yes.  Personal belongings secured: Yes.  Patient dressed in hospital provided attire only: Yes.  Plastic bags out of patient reach: Yes.  Patient care equipment (cords, cables, call bells, lines, and drains) shortened, removed, or accounted for: Yes.  Equipment and supplies removed from bottom of stretcher: Yes.  Potentially toxic materials out of patient reach: Yes.  Sharps container removed or out of patient reach: Yes.

## 2016-07-15 NOTE — ED Notes (Signed)
Patient more calm, continuous requests and speaking to people in room he appears to imagine but no aggressive.

## 2016-07-16 DIAGNOSIS — F259 Schizoaffective disorder, unspecified: Secondary | ICD-10-CM | POA: Diagnosis not present

## 2016-07-16 MED ORDER — LORAZEPAM 2 MG/ML IJ SOLN: 2.0000 mg | Freq: Once | INTRAMUSCULAR | Status: DC

## 2016-07-16 MED ORDER — HALOPERIDOL LACTATE 5 MG/ML IJ SOLN
5.0000 mg | Freq: Once | INTRAMUSCULAR | Status: AC
  Administered 2016-07-16: 5 mg via INTRAMUSCULAR

## 2016-07-16 MED ORDER — HALOPERIDOL LACTATE 5 MG/ML IJ SOLN
INTRAMUSCULAR | Status: AC
  Administered 2016-07-16: 5 mg via INTRAMUSCULAR
  Filled 2016-07-16: qty 1

## 2016-07-16 MED ORDER — LORAZEPAM 2 MG/ML IJ SOLN
2.0000 mg | Freq: Once | INTRAMUSCULAR | Status: AC
  Administered 2016-07-16: 2 mg via INTRAMUSCULAR

## 2016-07-16 MED ORDER — LORAZEPAM 2 MG/ML IJ SOLN
INTRAMUSCULAR | Status: AC
  Administered 2016-07-16: 2 mg via INTRAMUSCULAR
  Filled 2016-07-16: qty 1

## 2016-07-16 NOTE — ED Notes (Signed)
Pt on his bed and arms and legs held by ODS and [employer] officers for safety while pt given haldol and ativan  injection. Interaction observed by Modena Nunnery, RN Charge Nurse and pt safely released with no harm.

## 2016-07-16 NOTE — ED Notes (Addendum)
Pt in room without his clothes on., Nurse informs pt that he must keep his clothes on. Pt tries to grab nurses pants leg.  Pt thinks this nurse is his wife. Pt redirected but continues think I am his wife.  Nurse to give pt Haldol PRN as ordered for agitation

## 2016-07-16 NOTE — ED Notes (Signed)
BEHAVIORAL HEALTH ROUNDING Patient sleeping: Yes.   Patient alert and oriented: not applicable SLEEPING Behavior appropriate: Yes.  ; If no, describe: SLEEPING Nutrition and fluids offered: No SLEEPING Toileting and hygiene offered: NoSLEEPING Sitter present: not applicable, Q 15 min safety rounds and observation. Law enforcement present: Yes ODS 

## 2016-07-16 NOTE — ED Notes (Signed)
BEHAVIORAL HEALTH ROUNDING  Patient sleeping: No.  Patient alert and oriented: yes  Behavior appropriate: Yes. ; If no, describe:  Nutrition and fluids offered: Yes  Toileting and hygiene offered: Yes  Sitter present: not applicable, Q 15 min safety rounds and observation.  Law enforcement present: Yes ODS  

## 2016-07-16 NOTE — ED Notes (Signed)
Pt continues to not allow staff to clean him up or to clean up himself. Numerous attempts made by this RN and EDT's. Pt did go to the bathroom but did not clean himself. Stool noted over the floor in the patients room and also noted on his bed. Floor and bed cleansed while pt in the bathroom.

## 2016-07-16 NOTE — ED Notes (Signed)
Pt more calm at this time. EDT will attempt to get pt to allow her to take him to the shower.

## 2016-07-16 NOTE — Progress Notes (Signed)
LCSW called [facility] and spoke with Barb patient remains on wait list. No beds available.  [employer] LCSW [phone]

## 2016-07-16 NOTE — ED Notes (Signed)
Dinner given to pt with a sprite to drink.

## 2016-07-16 NOTE — ED Notes (Signed)
Pt noted to be awake. Pt has removed his colostomy bag and has BM over his abd groin region. Pt refusing to clean himself up or to allow the staff to assist him at this time. Will continue to attempt to encourage pt to clean up and/or to  allow staff to help him.

## 2016-07-16 NOTE — ED Notes (Signed)
Pt takes off colostomy. Stool noted on bed and clothing. Pt cleaned, clothing and bed linens changed. New colostomy bag placed.

## 2016-07-16 NOTE — ED Notes (Signed)
Pt back to his room and still has not cleansed himself. Pt again getting stool over the floor and room. Pt becoming more agitated with attempts to get him to clean himself or to allow staff to assist.  Dr Dahlia Client informed and orders given for haldol and ativan IM.

## 2016-07-16 NOTE — ED Notes (Signed)
Pt ripped off colostomy bag in bathroom, colostomy bag replaced by this RN and [place].

## 2016-07-16 NOTE — ED Provider Notes (Signed)
-----------------------------------------   4:09 AM on 07/16/2016 -----------------------------------------   Blood pressure 116/79, pulse 76, temperature 98.3 F (36.8 C), temperature source Oral, resp. rate 18, SpO2 98 %.  The patient was agitated overnight. He required some ativan and haldol to treat his agitation. He had removed his colostomy bag and had feces all over the floor of the room. He is currently on the waitlist for [facility] and [facility]     Loney Hering, MD 07/16/16 [phone]

## 2016-07-16 NOTE — ED Notes (Signed)
When nurse returns to room with medication pt is fully clothed as he has redressed himself.

## 2016-07-16 NOTE — ED Notes (Signed)
BEHAVIORAL HEALTH ROUNDING  Patient sleeping: No.  Patient alert and oriented: yes  Behavior appropriate: No. ; If no, describe: See nurses notes over the last hour.  Nutrition and fluids offered: Yes  Toileting and hygiene offered: Yes  Sitter present: not applicable, Q 15 min safety rounds and observation.  Law enforcement present: Yes ODS

## 2016-07-17 DIAGNOSIS — F259 Schizoaffective disorder, unspecified: Secondary | ICD-10-CM | POA: Diagnosis not present

## 2016-07-17 MED ORDER — SERTRALINE HCL 100 MG PO TABS
ORAL_TABLET | ORAL | Status: AC
  Administered 2016-07-17: 23:00:00
  Filled 2016-07-17: qty 1

## 2016-07-17 MED ORDER — SERTRALINE HCL 50 MG PO TABS
ORAL_TABLET | ORAL | Status: AC
  Administered 2016-07-17: 23:00:00
  Filled 2016-07-17: qty 1

## 2016-07-17 NOTE — ED Notes (Signed)
Pt found to be naked in room and ripped off colostomy bag again.  Pt dressed and new colostomy bag placed.  Pt put in mittens to prevent ripping off of colostomy bag.

## 2016-07-17 NOTE — ED Notes (Signed)
Pt to bathroom at this time with this tech in doorway. Pt emptied his colostomy bag in toilet successfully.

## 2016-07-17 NOTE — ED Notes (Signed)
Pt assisted to bathroom to urinate 

## 2016-07-17 NOTE — ED Notes (Signed)
Pt up to bathroom to urinate.

## 2016-07-17 NOTE — ED Notes (Signed)
Pt placed pillow, sheet and blanket into dirty linen basket after being told to keep it on his bed. Pt aware that he would not get it back at this moment because this was the forth set given to pt since this am. Pillow taken away as well and set to the side.

## 2016-07-17 NOTE — ED Notes (Signed)
Pt up to bathroom to empty colostomy bag again.

## 2016-07-17 NOTE — ED Notes (Signed)
Pt up to bathroom. Emptied colostomy bag successfully.

## 2016-07-17 NOTE — ED Notes (Signed)
Pt to bathroom at this time to urinate.

## 2016-07-17 NOTE — ED Notes (Signed)
Ileostomy emptied. approx 75cc of stool. Pt encouraged to notify staff when bag needs to be emptied.

## 2016-07-17 NOTE — ED Notes (Signed)
Pt given meal tray and juice. 

## 2016-07-17 NOTE — ED Notes (Signed)
PT IVC PENDING PLACEMENT STILL, IVC PAPERS NEED RENEWAL 07/18/16

## 2016-07-17 NOTE — ED Provider Notes (Signed)
-----------------------------------------   6:41 AM on 07/17/2016 -----------------------------------------   Blood pressure (!) 153/93, pulse 79, temperature 98.5 F (36.9 C), temperature source Oral, resp. rate 20, SpO2 98 %.  The patient had no acute events since last update.  Calm and cooperative at this time.  The patient did not require any intervention overnight. The patient is still on the wait list for [facility] [place], MD 07/17/16 [phone]

## 2016-07-18 DIAGNOSIS — F259 Schizoaffective disorder, unspecified: Secondary | ICD-10-CM | POA: Diagnosis not present

## 2016-07-18 LAB — CBC WITH DIFFERENTIAL/PLATELET
BASOS ABS: 0 10*3/uL (ref 0–0.1)
Basophils Relative: 0 %
EOS ABS: 0.2 10*3/uL (ref 0–0.7)
EOS PCT: 3 %
HCT: 33.4 % — ABNORMAL LOW (ref 40.0–52.0)
HEMOGLOBIN: 11.3 g/dL — AB (ref 13.0–18.0)
Lymphocytes Relative: 26 %
Lymphs Abs: 1.7 10*3/uL (ref 1.0–3.6)
MCH: 31.8 pg (ref 26.0–34.0)
MCHC: 33.8 g/dL (ref 32.0–36.0)
MCV: 94.2 fL (ref 80.0–100.0)
Monocytes Absolute: 0.8 10*3/uL (ref 0.2–1.0)
Monocytes Relative: 12 %
NEUTROS PCT: 59 %
Neutro Abs: 3.9 10*3/uL (ref 1.4–6.5)
PLATELETS: 282 10*3/uL (ref 150–440)
RBC: 3.55 MIL/uL — AB (ref 4.40–5.90)
RDW: 15.5 % — ABNORMAL HIGH (ref 11.5–14.5)
WBC: 6.7 10*3/uL (ref 3.8–10.6)

## 2016-07-18 NOTE — ED Provider Notes (Signed)
-----------------------------------------   7:10 AM on 07/18/2016 -----------------------------------------   Blood pressure (!) 149/88, pulse 77, temperature 98.6 F (37 C), temperature source Oral, resp. rate 16, SpO2 98 %.  The patient had no acute events since last update.  Calm and cooperative at this time.  Disposition is pending Psychiatry/Behavioral Medicine team recommendations.     Daymon Larsen, MD 07/18/16 [phone]

## 2016-07-18 NOTE — ED Notes (Signed)
Patient removed his colostomy bag. New bag was placed on the patient after area was cleaned. Patient tolerated the procedure well, but did tell this Probation officer that "You will do what I say or I will hurt you." Security and [employer] were informed. Patient did not show any other signs of aggression.

## 2016-07-18 NOTE — ED Notes (Signed)
Patient yelling out, c/o agitation. Dr. Marcelene Butte aware.

## 2016-07-18 NOTE — Consult Note (Addendum)
Pt ordered clozapine- pt is enrolled in [facility] program and is eligible. Outpatient labs q 4 weeks- inpatient labs q weekly. Last lab today 07/18/16. Submitted to REM program. Next lab due 1/23 while inpatient.  Charlane Ferretti, [facility] Pharmacist

## 2016-07-18 NOTE — ED Notes (Signed)
CSW called [facility] [phone]) to inquire about pt's status. Pt remains on the [facility] wait list.  Georga Kaufmann, MSW, [place]

## 2016-07-18 NOTE — ED Notes (Signed)
Gave patient 2 warm blankets. Patient now resting comfortably

## 2016-07-19 DIAGNOSIS — F259 Schizoaffective disorder, unspecified: Secondary | ICD-10-CM | POA: Diagnosis not present

## 2016-07-19 NOTE — ED Notes (Signed)
BEHAVIORAL HEALTH ROUNDING Patient sleeping: No. Patient alert and oriented: yes Behavior appropriate: Yes.  ; If no, describe:  Nutrition and fluids offered: yes Toileting and hygiene offered: Yes  Sitter present: q15 minute observations and security  monitoring Law enforcement present: Yes  ODS  
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
## 2016-07-19 NOTE — ED Notes (Signed)
ED  Is the patient under IVC or is there intent for IVC: Yes.   Is the patient medically cleared: Yes.   Is there vacancy in the ED BHU: Yes.   Is the population mix appropriate for patient: Yes.   Is the patient awaiting placement in inpatient or outpatient setting: Yes.   Has the patient had a psychiatric consult: Yes.   Survey of unit performed for contraband, proper placement and condition of furniture, tampering with fixtures in bathroom, shower, and each patient room: Yes.  ; Findings:  APPEARANCE/BEHAVIOR Calm and cooperative NEURO ASSESSMENT Orientation: oriented x3  Denies pain Hallucinations: No.None noted (Hallucinations) Speech: Normal Gait: normal RESPIRATORY ASSESSMENT Even  Unlabored respirations  CARDIOVASCULAR ASSESSMENT Pulses equal   regular rate  Skin warm and dry   GASTROINTESTINAL ASSESSMENT no GI complaint EXTREMITIES Full ROM  PLAN OF CARE Provide calm/safe environment. Vital signs assessed twice daily. ED BHU Assessment once each 12-hour shift. Collaborate withTTS daily or as condition indicates. Assure the ED provider has rounded once each shift. Provide and encourage hygiene. Provide redirection as needed. Assess for escalating behavior; address immediately and inform ED provider.  Assess family dynamic and appropriateness for visitation as needed: Yes.  ; If necessary, describe findings:  Educate the patient/family about BHU procedures/visitation: Yes.  ; If necessary, describe findings:   

## 2016-07-19 NOTE — ED Notes (Signed)
PT  IVC (3RD SET)  ON  [facility] WAITLIST

## 2016-07-19 NOTE — ED Notes (Signed)
Pt observed with no unusual behavior - sitting on his mattress on the floor   Appropriate to stimulation  No verbalized needs or concerns at this time  NAD assessed  Continue to monitor

## 2016-07-19 NOTE — BH Assessment (Signed)
Confirmed with [facility] ([phone]), patient remain on their Wait List.

## 2016-07-19 NOTE — ED Notes (Signed)
BEHAVIORAL HEALTH ROUNDING Patient sleeping: No. Patient alert and oriented:  Yes  Behavior appropriate: yes at this time  ; If no, describe:  Nutrition and fluids offered: yes Toileting and hygiene offered: Yes  Sitter present: q15 minute observations and security monitoring Law enforcement present: Yes  ODS  ENVIRONMENTAL ASSESSMENT Potentially harmful objects out of patient reach: Yes.   Personal belongings secured: Yes.   Patient dressed in hospital provided attire only: Yes.   Plastic bags out of patient reach: Yes.   Patient care equipment (cords, cables, call bells, lines, and drains) shortened, removed, or accounted for: Yes.   Equipment and supplies removed from bottom of stretcher: Yes.   Potentially toxic materials out of patient reach: Yes.   Sharps container removed or out of patient reach: Yes.

## 2016-07-19 NOTE — ED Provider Notes (Signed)
Vitals:   07/18/16 2205 07/19/16 0610  BP: (!) 148/97 100/72  Pulse: 74 78  Resp: 18 17  Temp: 98.8 F (37.1 C) 98.6 F (37 C)    No evidence over night, patient remains medically stable for psychiatric evaluation and disposition.   Earleen Newport, MD 07/19/16 [phone]

## 2016-07-19 NOTE — ED Notes (Signed)
Pt in bathroom repeatedly flushing toilet. Pt ask to return to room. Pt returned with no issues

## 2016-07-20 DIAGNOSIS — F259 Schizoaffective disorder, unspecified: Secondary | ICD-10-CM | POA: Diagnosis not present

## 2016-07-20 MED ORDER — HALOPERIDOL 5 MG PO TABS
ORAL_TABLET | ORAL | Status: AC
  Administered 2016-07-20: 5 mg via ORAL
  Filled 2016-07-20: qty 1

## 2016-07-20 MED ORDER — LORAZEPAM 2 MG PO TABS
2.0000 mg | ORAL_TABLET | Freq: Once | ORAL | Status: AC
  Administered 2016-07-20: 2 mg via ORAL

## 2016-07-20 MED ORDER — LORAZEPAM 2 MG PO TABS
ORAL_TABLET | ORAL | Status: AC
  Administered 2016-07-20: 2 mg via ORAL
  Filled 2016-07-20: qty 1

## 2016-07-20 MED ORDER — HALOPERIDOL 5 MG PO TABS
5.0000 mg | ORAL_TABLET | Freq: Once | ORAL | Status: AC
  Administered 2016-07-20: 5 mg via ORAL

## 2016-07-20 NOTE — ED Notes (Signed)
Pt out of his room trying to dig through trash. Pt redirected to go back in his room; became aggressive toward this nurse, threatening to "tear your head off." Pt refused to back down or calm down, so this nurse left the area to give pt a reprieve. [employer] officer directed pt back to room.

## 2016-07-20 NOTE — ED Notes (Signed)
Pt. Ambulated to bathroom and emptied colostomy bag in bathroom sink.  Pt.  Has been repeatedly told to ask for assistance before emptying bag or pulling bag off body.  Pt. Agreed he would.

## 2016-07-20 NOTE — ED Notes (Signed)
Pt calls out making comments that are sexual. Pt redirected to being asked what he needed. No needs expressed. Pt fell back asleep once nurse left room.

## 2016-07-20 NOTE — ED Notes (Signed)
Patient messed with his colostomy bag and had stool all down his legs. Assisted patient with cleaning up and changed out all of patients scrubs and new socks.

## 2016-07-20 NOTE — BH Assessment (Signed)
Confirmed with [facility]([phone]), patientremain on their WaitList.

## 2016-07-20 NOTE — ED Notes (Signed)
Pt opened colostomy bag and had feces down leg. Pt cleaned with wipes and given new paper scrubs.

## 2016-07-20 NOTE — ED Notes (Addendum)
Pt ran out of his room and jumped onto [facility] stretcher. Pt asked multiple times to move back into his room, and he refused. Pt moved (on stretcher) into room 20. Pt very hostile and defiant. Pt still very aggressive toward this nurse. Pt given a blanket and informed that he will be back on the mattress if he messes the stretcher up again.

## 2016-07-20 NOTE — ED Notes (Signed)
Patient given meal tray and is requesting salad. Meal tray that was given had lettuce and I put some ranch dressing on it. Patient very happy.

## 2016-07-20 NOTE — ED Notes (Signed)
Pt refuses to shower. Pt dressing and will be placed in hallway while room is being cleaned.

## 2016-07-20 NOTE — ED Notes (Signed)
Pt took off clothing while in restroom. This nurse attempted to empty his colostomy bag but it was coming off, so it was replaced. Pt redirected to his room and given his lunch (he slept through lunch time).

## 2016-07-20 NOTE — ED Notes (Signed)
Given dinner tray

## 2016-07-20 NOTE — ED Notes (Addendum)
Pt making sexual statements to this nurse after coming out into hallway naked and holding his clothes. Pt redirected to his room. Pt does not appear to want to wear clothing, and we will allow him to be naked as long as he remains in his room.

## 2016-07-20 NOTE — ED Notes (Signed)
Resumed care from [place], [place]. Pt resting on his mattress yelling out but calm.

## 2016-07-20 NOTE — ED Provider Notes (Signed)
-----------------------------------------   4:31 AM on 07/20/2016 -----------------------------------------   BP (!) 144/96 (BP Location: Left Arm)   Pulse 87   Temp 97.9 F (36.6 C) (Oral)   Resp 16   SpO2 100%   No acute events since last update.  Disposition is pending per Psychiatry/Behavioral Medicine team recommendations.     Nance Pear, MD 07/20/16 [phone]

## 2016-07-20 NOTE — ED Notes (Signed)
Pt has spread feces all over his room; his mattress and floor are covered with feces. Pt states that "the devil made me." Attempted reasoning with pt without success. Pt put in shower, and given towels, soap, deodorant. Clothing held outside until pt is showered.

## 2016-07-21 DIAGNOSIS — F259 Schizoaffective disorder, unspecified: Secondary | ICD-10-CM | POA: Diagnosis not present

## 2016-07-21 MED ORDER — LORAZEPAM 2 MG/ML IJ SOLN
2.0000 mg | Freq: Once | INTRAMUSCULAR | Status: AC
  Administered 2016-07-21: 2 mg via INTRAMUSCULAR
  Filled 2016-07-21: qty 1

## 2016-07-21 MED ORDER — HALOPERIDOL LACTATE 5 MG/ML IJ SOLN
10.0000 mg | Freq: Once | INTRAMUSCULAR | Status: AC
  Administered 2016-07-21: 10 mg via INTRAMUSCULAR

## 2016-07-21 MED ORDER — HALOPERIDOL LACTATE 5 MG/ML IJ SOLN
5.0000 mg | Freq: Once | INTRAMUSCULAR | Status: AC
  Administered 2016-07-21: 5 mg via INTRAMUSCULAR
  Filled 2016-07-21: qty 1

## 2016-07-21 MED ORDER — HALOPERIDOL LACTATE 5 MG/ML IJ SOLN
INTRAMUSCULAR | Status: AC
  Administered 2016-07-21: 10 mg via INTRAMUSCULAR
  Filled 2016-07-21: qty 2

## 2016-07-21 NOTE — ED Notes (Signed)
Pt given snack and drink 

## 2016-07-21 NOTE — ED Notes (Signed)
Patient frequently leaving room and trying to roam hallways. Attempting to grab staff members and charge officers. Patient continues to speak to staff members in sexual manner. Dr. Darl Householder aware. Medications given as ordered.

## 2016-07-21 NOTE — ED Notes (Signed)
Assisted pt to ambulate to the bathroom; pt urinated, washed his hands and was helped back to his room; pt is cooperative, able to follow verbal commands

## 2016-07-21 NOTE — ED Notes (Signed)
Pt outside of room speaking loudly. Pt told he needed to go back to his room.

## 2016-07-21 NOTE — ED Provider Notes (Signed)
Patient received 5 of Haldol and 2 of Ativan IM earlier slept for a brief period time but is now awake and ripping his shirt off again and acting very angry and rambunctious. We will try 10 of Haldol IM this time and had 2 more Ativan afterwards if need be.   Nena Polio, MD 07/21/16 [phone]

## 2016-07-21 NOTE — ED Notes (Signed)
Pt awake. Pt ambulated to bathroom.

## 2016-07-21 NOTE — ED Notes (Signed)
Pt unlocked bed, put all sheet on the floor, and called for the nurse. The pt stood by the bed and pointed under the bed stating "my mom is sick."

## 2016-07-21 NOTE — BHH Counselor (Signed)
Per [facility] request, faxed updated IVC and five days vital and MAR for MD review.

## 2016-07-21 NOTE — Progress Notes (Signed)
LCSW called [facility] and NO beds currently as per Alita Chyle LCSW [phone]

## 2016-07-21 NOTE — ED Provider Notes (Signed)
-----------------------------------------   7:39 AM on 07/21/2016 -----------------------------------------   Blood pressure 109/90, pulse (!) 120, temperature 98.5 F (36.9 C), temperature source Oral, resp. rate 18, SpO2 100 %.  The patient had no acute events since last update.  The patient has come out of his room a few times but was easily redirected. We will reassess the patient's pulse and he is still waiting for acceptance at [place], MD 07/21/16 [phone]

## 2016-07-21 NOTE — ED Notes (Signed)
Pt was raising his bed to a unsafe height. Pt then unlocked bed & trying to get on it. Pt's bed removed and only left with mattress and sheets.

## 2016-07-21 NOTE — ED Notes (Signed)
Pt came out of room demanding sprite. Pt settled for cup of water and ice.

## 2016-07-21 NOTE — ED Notes (Signed)
Pt takes his sheets into bathroom. Pt told he needed to go back to his room. Pt escorted by tech back into room.

## 2016-07-21 NOTE — ED Notes (Signed)
Patient continues to have to be redirected back to room frequently. Patient does not try to hit security officers, but continues to run his chest into officers.

## 2016-07-21 NOTE — ED Notes (Signed)
While in bathroom pt removed colostomy bag. Pt had feces on abdomen and down leg. Scrubs removed. Pt cleaned with wipes and new scrubs give. Pt's sheets were soiled with feces and new sheets placed on bed. Pt helped to reposition and is now sleeping.

## 2016-07-21 NOTE — ED Notes (Signed)
Pt being loud in room. Pt using vulgarities to staff.

## 2016-07-21 NOTE — ED Notes (Signed)
Pt received breakfast tray with sprite. Pt walking around room. Pt emptied colostomy bag in room sink.

## 2016-07-21 NOTE — ED Notes (Signed)
Patient went to restroom and was trying to empty colostomy bag into the sink. Patient already had water turned off to sink in his room d/t patient doing the same and trying to clog up the sink. Patient advised to empty his colostomy bag into toilet. Patient emptied bag onto side of toilet and dripped down his pants. Patient insisted that this RN "wipe his self". Patient informed that he is capable of wiping himself. Bathroom cleaned up. Patient given fresh pants and returned to room. Once patient returned to room, patient ripped his shirt and tried to escape from the quad area to run down the hall. Patient was stopped by the officer present and by a medic present and safely returned to his room. Patient continues to try to push his chest into officer. Dr. Cinda Quest notified. Haldol given as ordered. Patient remains on mattress on floor for bedding d/t patient's attempts to jump on bed after unlocking it.

## 2016-07-21 NOTE — ED Notes (Signed)
Pt very agitated in hallway. Pt assisted back into room.

## 2016-07-21 NOTE — ED Notes (Addendum)
Pt came out of room and jumped on bed in [facility] and laid down. Pt had to be escorted by security officer off of bed and back into room. Pt repositioned and no other needs expressed.

## 2016-07-22 DIAGNOSIS — F259 Schizoaffective disorder, unspecified: Secondary | ICD-10-CM | POA: Diagnosis not present

## 2016-07-22 MED ORDER — LORAZEPAM 2 MG/ML IJ SOLN
2.0000 mg | Freq: Once | INTRAMUSCULAR | Status: AC
  Administered 2016-07-22: 2 mg via INTRAMUSCULAR
  Filled 2016-07-22: qty 1

## 2016-07-22 NOTE — ED Notes (Signed)
Pt in hallway talking about wanting his shoes, after being directed to return to room pt began to slap at ODS officer, pt ushered back to room by ODS and this RN.  Pt refuses to acknowledge respect for others space and appropriate language.    Pt eventually redirected to sit in bed while staff provides Sprite.  Pt requesting nighttime meds.

## 2016-07-22 NOTE — ED Notes (Signed)
While attempting to discard the contents of his colostomy bag in the toilette, pt had the contents of the bag spill onto his leg.  Pt was assisted and directed to clean himself off with towels and clean wipes.  Pt ambulated back to his room after having his scrubs changed.

## 2016-07-22 NOTE — ED Notes (Signed)
Patient agreeable to ambulating with sheriff to car. Belonging bag x 2 and colostomy supplies sent with officer.

## 2016-07-22 NOTE — BHH Counselor (Signed)
Patient has been accepted by [facility], [address], [place], [place] [address].  Patient accepted by [facility].  Call report to [phone].  Patient can arrive anytime this morning.

## 2016-07-22 NOTE — BHH Counselor (Signed)
TTS Counselor placed call to [facility] ([facility]) to advise that due to lack of transportation over the week-end, pt will not arrive until Monday.  [facility] agreed to hold patient's bed until then.

## 2016-07-22 NOTE — ED Provider Notes (Signed)
-----------------------------------------   6:32 AM on 07/22/2016 -----------------------------------------   Blood pressure (!) 131/91, pulse 84, temperature 98.7 F (37.1 C), temperature source Oral, resp. rate 17, SpO2 100 %.  The patient received IM calming agent overnight for agitation; almost threw his ostomy bag. Calm and cooperative at this time.  Disposition is pending Psychiatry/Behavioral Medicine team recommendations.     Paulette Blanch, MD 07/22/16 [phone]

## 2016-08-22 ENCOUNTER — Ambulatory Visit (HOSPITAL_COMMUNITY): Payer: Self-pay

## 2016-08-22 ENCOUNTER — Other Ambulatory Visit (HOSPITAL_COMMUNITY): Payer: Self-pay

## 2018-02-12 DIAGNOSIS — F1411 Cocaine abuse, in remission: Secondary | ICD-10-CM | POA: Diagnosis not present

## 2018-02-12 DIAGNOSIS — Z720 Tobacco use: Secondary | ICD-10-CM | POA: Diagnosis not present

## 2018-02-12 DIAGNOSIS — R82998 Other abnormal findings in urine: Secondary | ICD-10-CM | POA: Diagnosis not present

## 2018-02-12 DIAGNOSIS — N189 Chronic kidney disease, unspecified: Secondary | ICD-10-CM | POA: Diagnosis not present

## 2018-02-12 DIAGNOSIS — Z638 Other specified problems related to primary support group: Secondary | ICD-10-CM | POA: Diagnosis not present

## 2018-02-12 DIAGNOSIS — K08409 Partial loss of teeth, unspecified cause, unspecified class: Secondary | ICD-10-CM | POA: Diagnosis not present

## 2018-02-12 DIAGNOSIS — F250 Schizoaffective disorder, bipolar type: Secondary | ICD-10-CM | POA: Diagnosis not present

## 2018-02-12 DIAGNOSIS — D509 Iron deficiency anemia, unspecified: Secondary | ICD-10-CM | POA: Diagnosis not present

## 2018-02-12 DIAGNOSIS — Z609 Problem related to social environment, unspecified: Secondary | ICD-10-CM | POA: Diagnosis not present

## 2018-02-12 DIAGNOSIS — Z599 Problem related to housing and economic circumstances, unspecified: Secondary | ICD-10-CM | POA: Diagnosis not present

## 2018-02-12 DIAGNOSIS — I10 Essential (primary) hypertension: Secondary | ICD-10-CM | POA: Diagnosis not present

## 2018-02-12 DIAGNOSIS — F1011 Alcohol abuse, in remission: Secondary | ICD-10-CM | POA: Diagnosis not present

## 2018-02-12 DIAGNOSIS — F259 Schizoaffective disorder, unspecified: Secondary | ICD-10-CM | POA: Diagnosis not present

## 2018-02-12 DIAGNOSIS — R32 Unspecified urinary incontinence: Secondary | ICD-10-CM | POA: Diagnosis not present

## 2018-02-12 DIAGNOSIS — Z933 Colostomy status: Secondary | ICD-10-CM | POA: Diagnosis not present

## 2018-02-12 DIAGNOSIS — K219 Gastro-esophageal reflux disease without esophagitis: Secondary | ICD-10-CM | POA: Diagnosis not present

## 2018-03-30 IMAGING — DX DG CHEST 2V
2 series · 2 of 2 positions shown · non-contrast
Comparison: None.

CLINICAL DATA: Weakness

EXAM:
CHEST  2 VIEW

[chest pa]
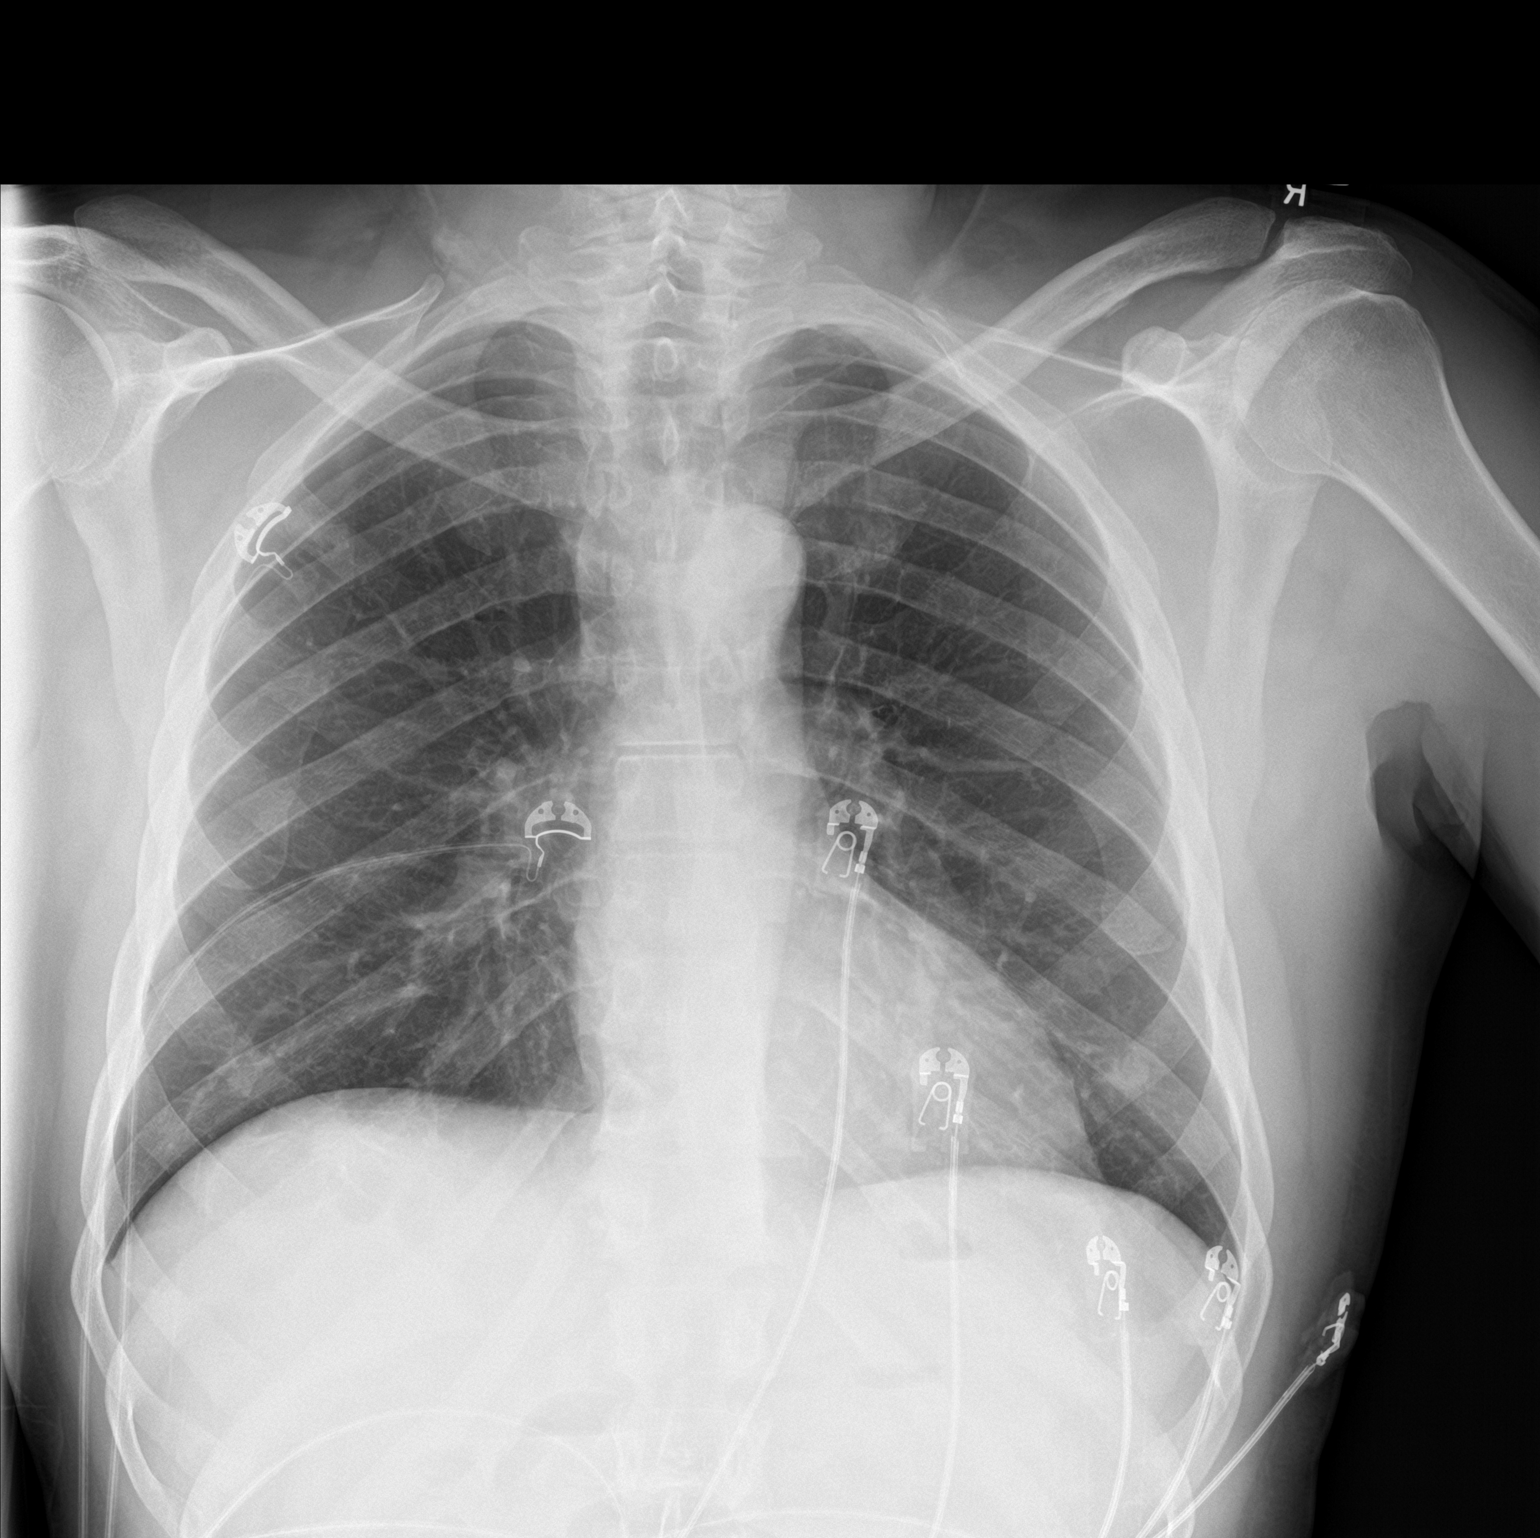

[chest lat]
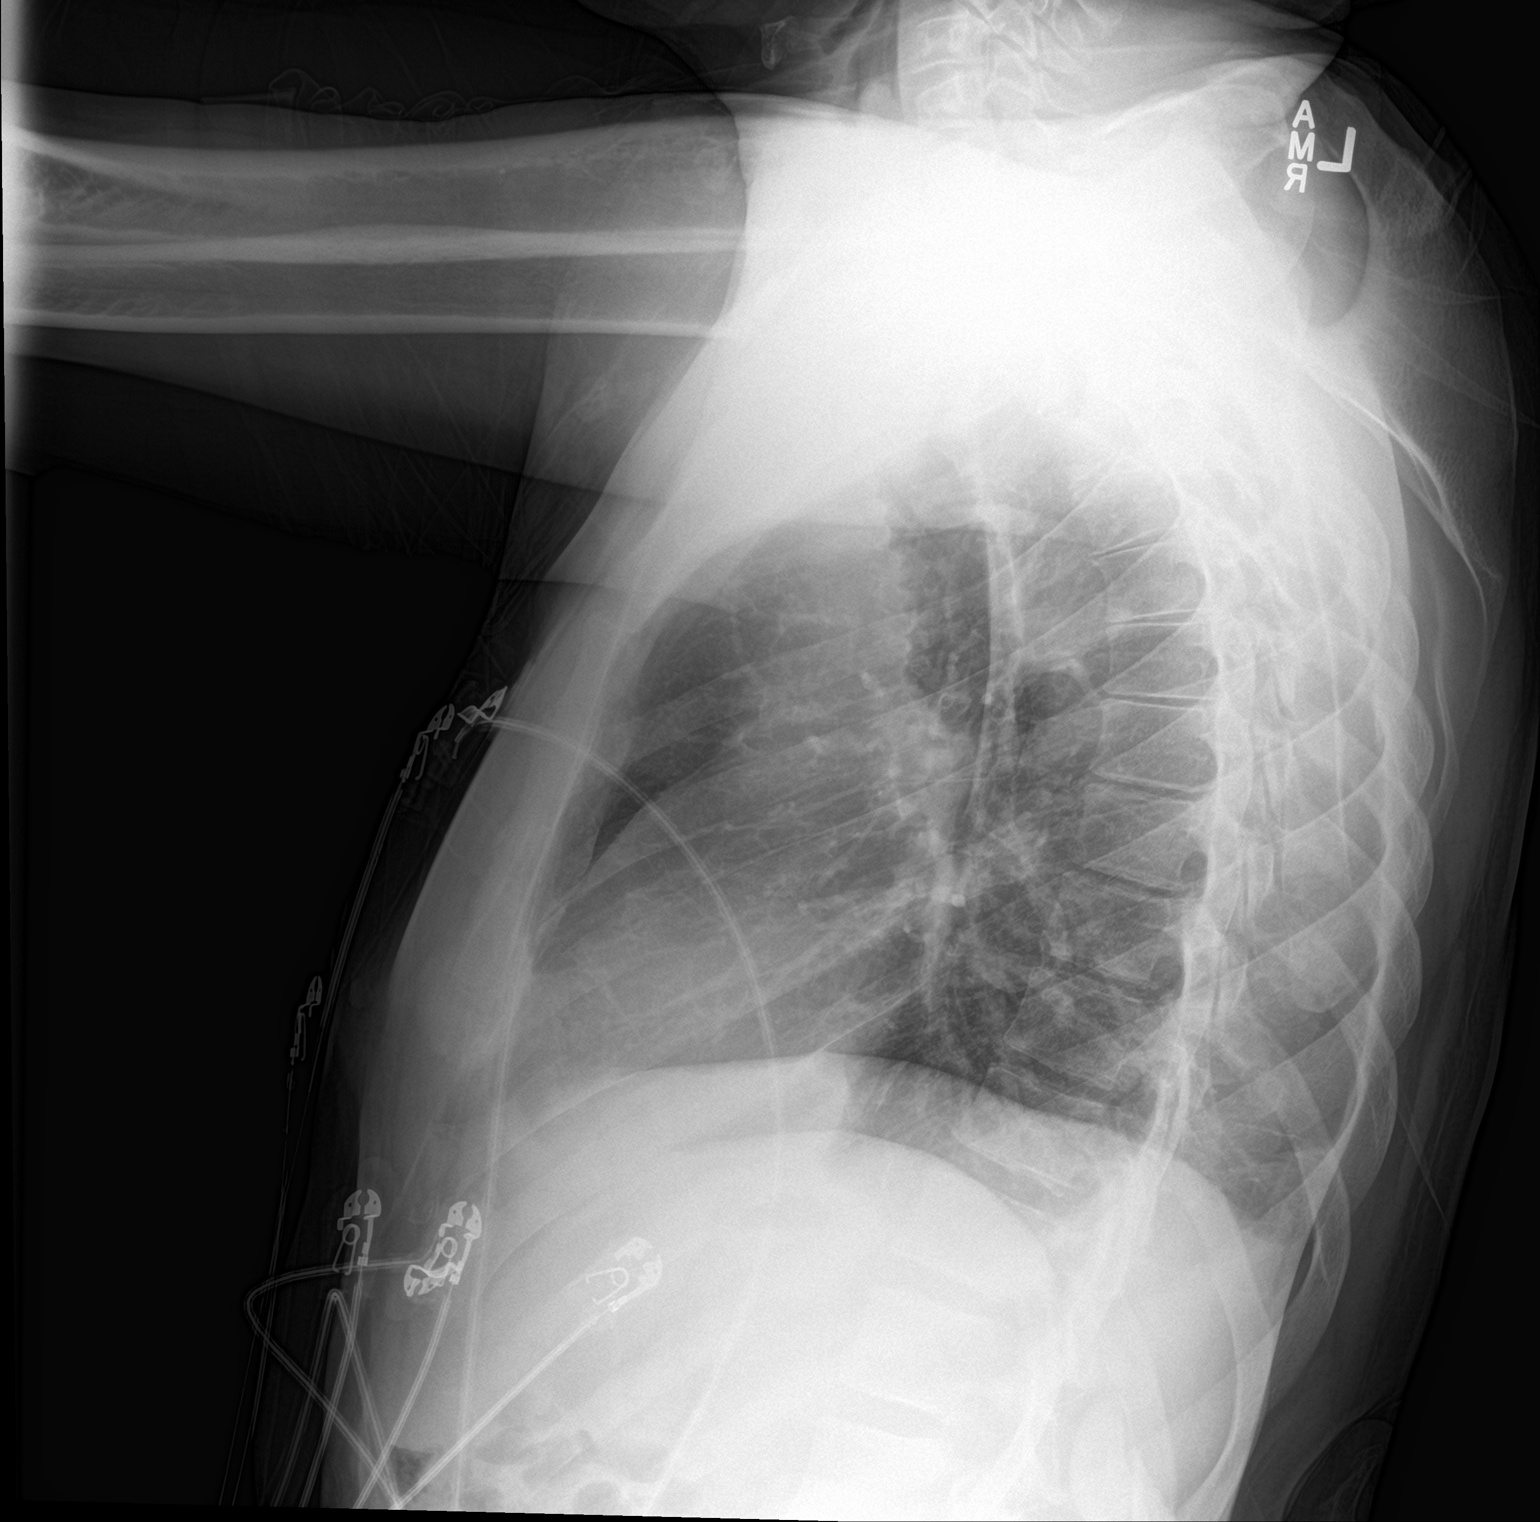

[2 of 2 positions shown; findings below may reference images not displayed]

FINDINGS: The heart size and mediastinal contours are within normal limits.
Both lungs are clear. The visualized skeletal structures are
unremarkable. Nipple shadows are noted bilaterally.
IMPRESSION: No active cardiopulmonary disease.

## 2018-04-01 IMAGING — CT CT ABD-PELV W/ CM
2 of 5 series · 15 of 46 positions shown, 17 images · IV contrast (iopamidol)
Comparison: CT of the abdomen and pelvis from 04/07/2015

CLINICAL DATA: Acute onset of loss of appetite. Decreased
hippocampal. Initial encounter.

EXAM:
CT ABDOMEN AND PELVIS WITH CONTRAST
TECHNIQUE: Multidetector CT imaging of the abdomen and pelvis was performed
using the standard protocol following bolus administration of
intravenous contrast.
CONTRAST:  100mL [id number] IOPAMIDOL (UAZ12P-[id number]) INJECTION 61%

[Series 2: routine abd pel with · axial · 0.67mm/px · z∈[-456,+4]mm · 12 of 104 slices shown, 14 images]
[im 6/104  soft-tissue]
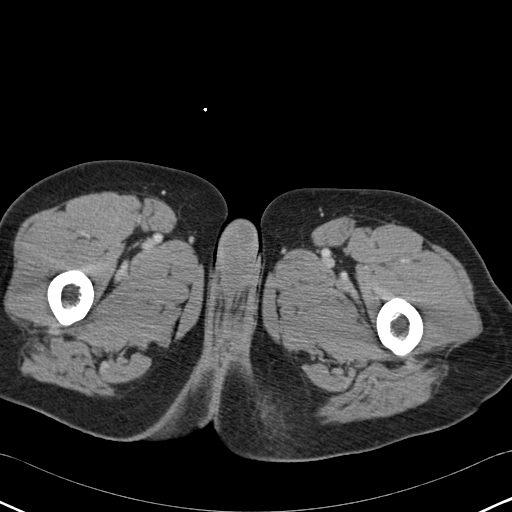
[im 6/104  bone]
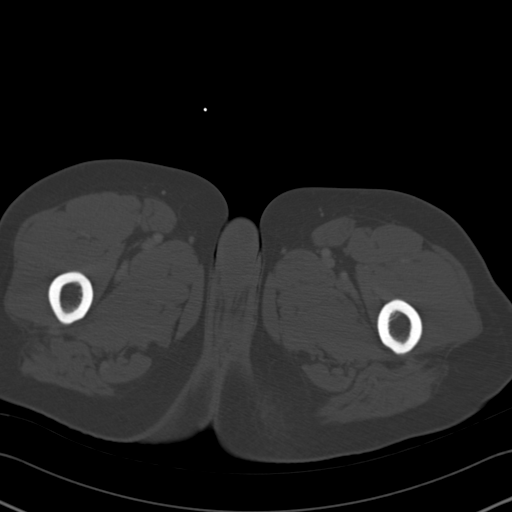
[im 17/104  soft-tissue]
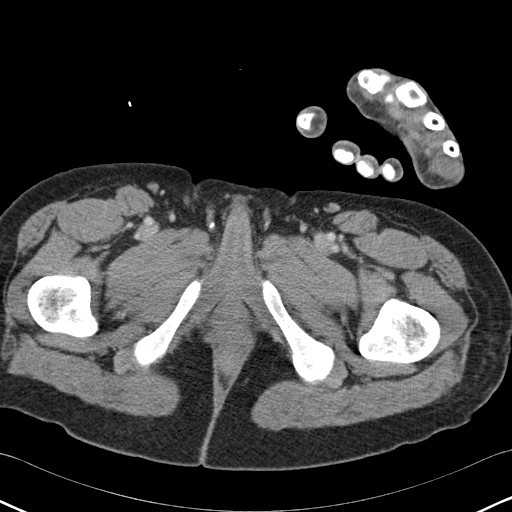
[im 22/104  soft-tissue]
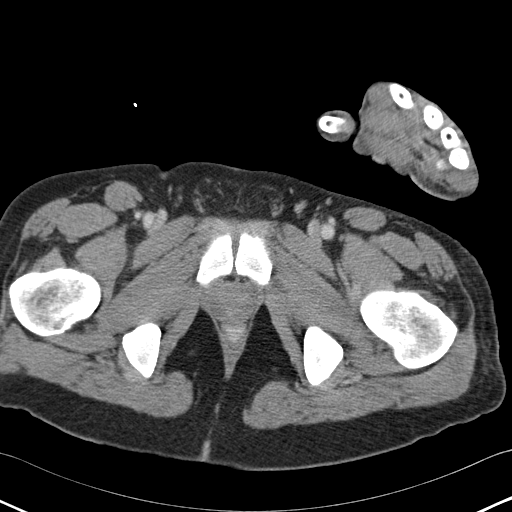
[im 33/104  soft-tissue]
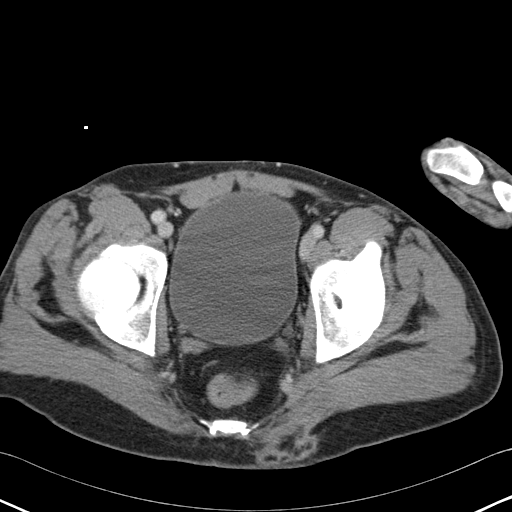
[im 38/104  soft-tissue]
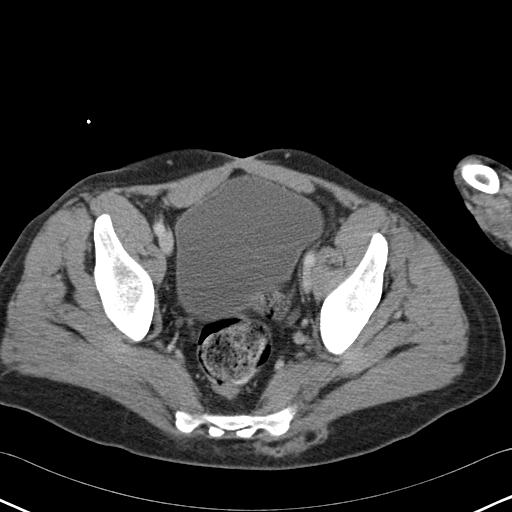
[im 49/104  soft-tissue]
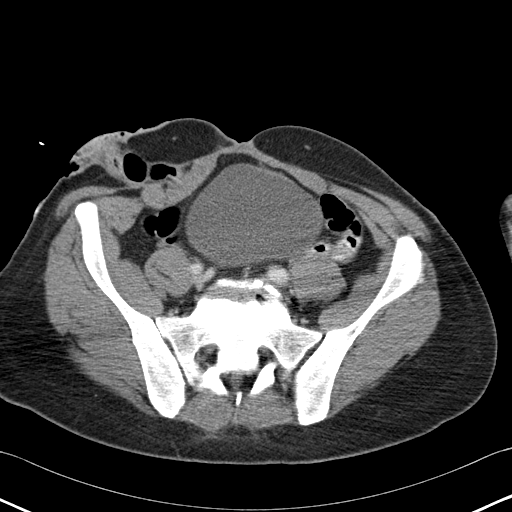
[im 55/104  soft-tissue]
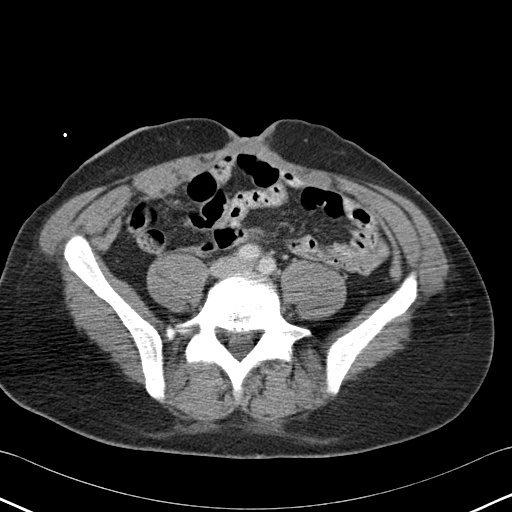
[im 66/104  soft-tissue]
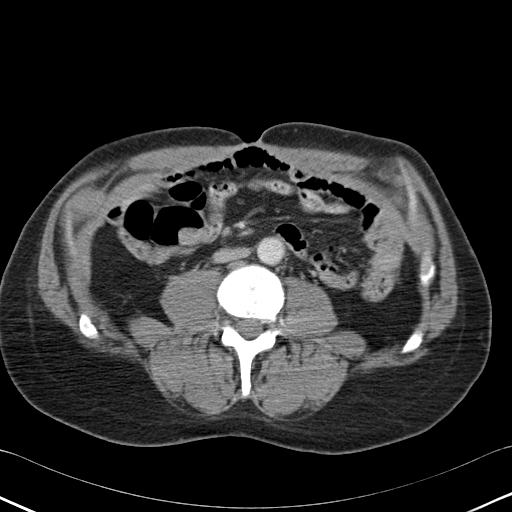
[im 71/104  soft-tissue]
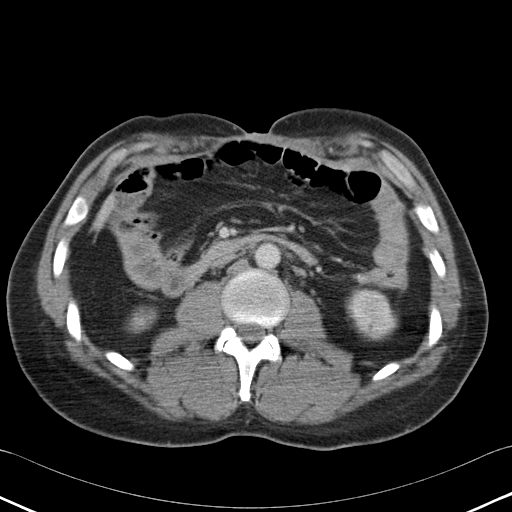
[im 71/104  bone]
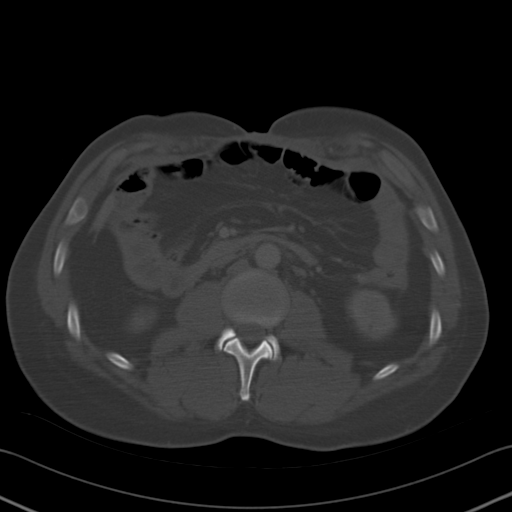
[im 82/104  soft-tissue]
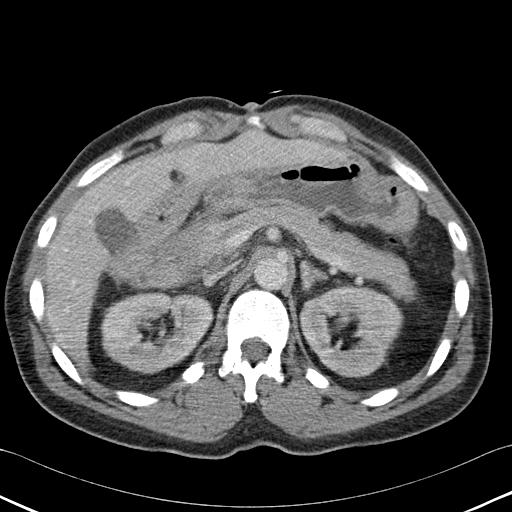
[im 87/104  soft-tissue]
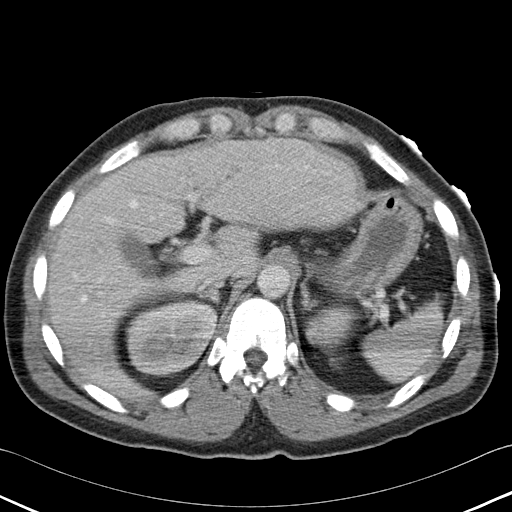
[im 98/104  soft-tissue]
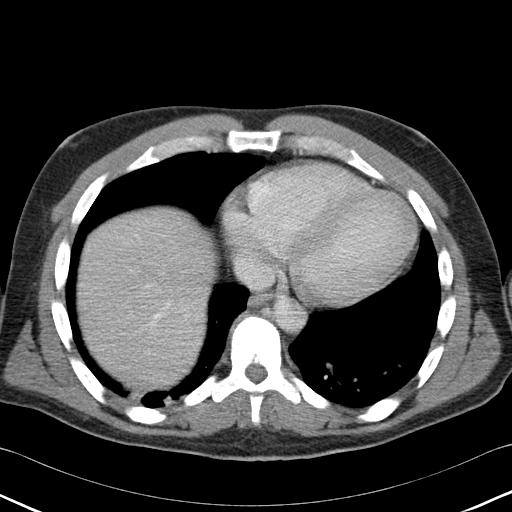

[Series 3: coronal · coronal · 0.73mm/px · 3 of 125 slices shown]
[im 42/125  soft-tissue]
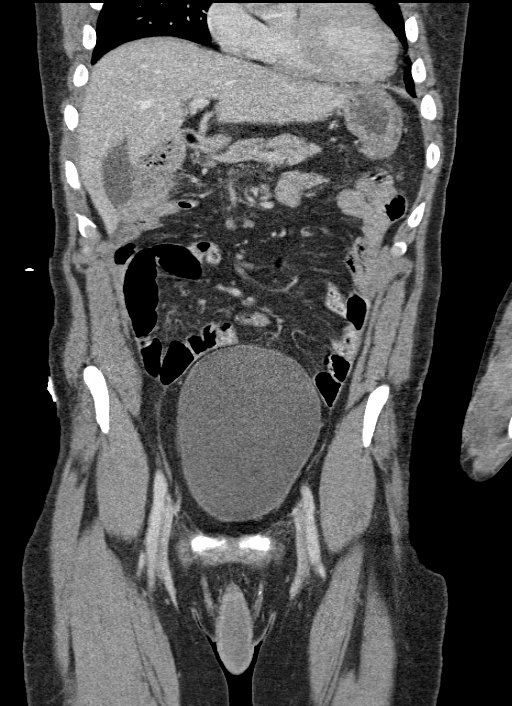
[im 56/125  soft-tissue]
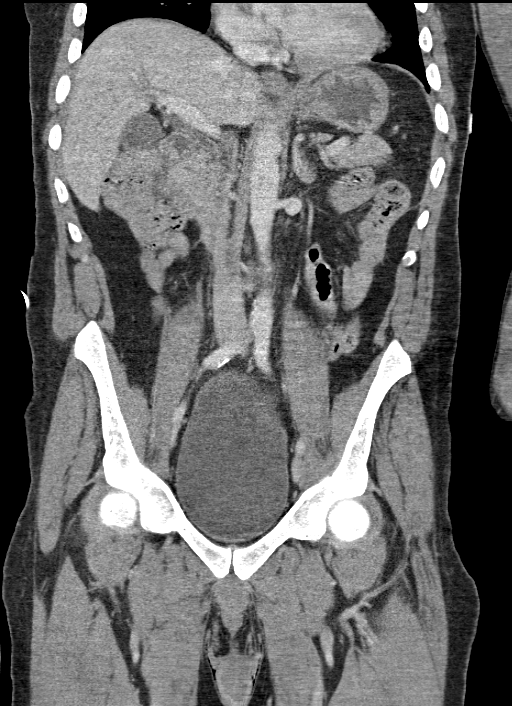
[im 69/125  soft-tissue]
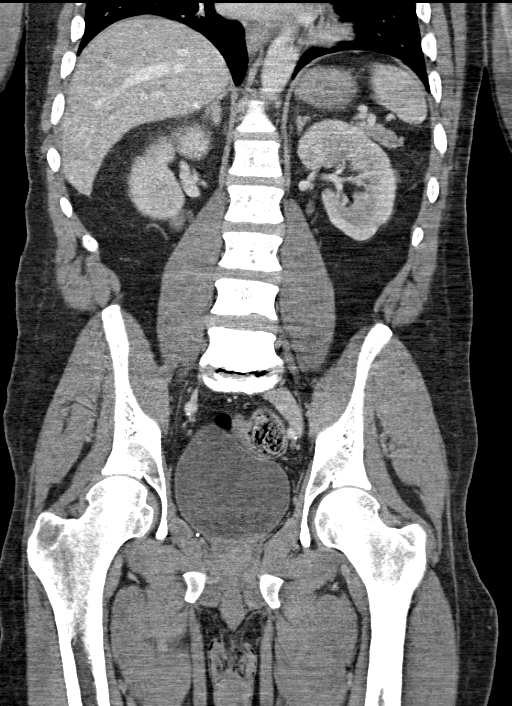

[15 of 46 positions shown; findings below may reference images not displayed]

FINDINGS: Lower chest: Minimal bibasilar atelectasis is noted. The visualized
portions of the mediastinum are unremarkable.

Hepatobiliary: The liver is grossly unremarkable in appearance.
Stones are noted dependently within the gallbladder. The common bile
duct remains normal in caliber.

Pancreas: The pancreas is unremarkable in appearance.

Spleen: The spleen is within normal limits.

Adrenals/Urinary Tract: The adrenal glands are unremarkable in
appearance. Small nonobstructing left renal stones are seen,
measuring up to 2 mm in size. The kidneys are otherwise
unremarkable. There is no evidence of hydronephrosis. No obstructing
ureteral stones are identified. No perinephric stranding is seen.

Stomach/Bowel: The stomach is unremarkable in appearance. The small
bowel is grossly unremarkable. A right lower quadrant colostomy is
unremarkable in appearance. The patient's Hartmann's pouch is within
normal limits.

Vascular/Lymphatic: Minimal calcification is seen along the distal
abdominal aorta and its branches. No acute vascular abnormalities
are seen. There is partial decompression of the IVC, likely
reflecting volume depletion. No retroperitoneal lymphadenopathy is
seen. No pelvic sidewall lymphadenopathy is identified.

Reproductive: The bladder is significantly distended and grossly
unremarkable in appearance. The prostate remains normal in size.

Musculoskeletal: No acute osseous abnormalities are identified. The
visualized musculature is within normal limits. Vacuum phenomenon is
noted at L5-S1.

Other: Mild focal soft tissue inflammation is noted at the left
lower back, overlying the left side of the sacrum, raising concern
for a mild sacral decubitus ulceration.
IMPRESSION: 1. Mild focal soft tissue inflammation at the left lower back,
overlying the left side of the sacrum, concerning for a mild sacral
decubitus ulceration. Would correlate clinically.
2. Partial decompression of the IVC likely reflects volume depletion
(dehydration).
3. Small nonobstructing left renal stones measure up to 2 mm in
size.
4. Right lower quadrant colostomy is unremarkable. Hartmann's pouch
is within normal limits.
5. Cholelithiasis.  Gallbladder otherwise grossly unremarkable.

## 2018-04-03 DIAGNOSIS — F250 Schizoaffective disorder, bipolar type: Secondary | ICD-10-CM | POA: Diagnosis not present

## 2018-04-23 DIAGNOSIS — F0280 Dementia in other diseases classified elsewhere without behavioral disturbance: Secondary | ICD-10-CM | POA: Diagnosis not present

## 2018-04-23 DIAGNOSIS — F250 Schizoaffective disorder, bipolar type: Secondary | ICD-10-CM | POA: Diagnosis not present

## 2018-04-23 DIAGNOSIS — F17210 Nicotine dependence, cigarettes, uncomplicated: Secondary | ICD-10-CM | POA: Diagnosis not present

## 2018-04-23 DIAGNOSIS — F5101 Primary insomnia: Secondary | ICD-10-CM | POA: Diagnosis not present

## 2018-04-23 DIAGNOSIS — F4312 Post-traumatic stress disorder, chronic: Secondary | ICD-10-CM | POA: Diagnosis not present

## 2018-04-24 DIAGNOSIS — F250 Schizoaffective disorder, bipolar type: Secondary | ICD-10-CM | POA: Diagnosis not present

## 2018-04-25 DIAGNOSIS — F0150 Vascular dementia without behavioral disturbance: Secondary | ICD-10-CM | POA: Diagnosis not present

## 2018-04-25 DIAGNOSIS — I10 Essential (primary) hypertension: Secondary | ICD-10-CM | POA: Diagnosis not present

## 2018-04-25 DIAGNOSIS — Z933 Colostomy status: Secondary | ICD-10-CM | POA: Diagnosis not present

## 2018-04-25 DIAGNOSIS — N189 Chronic kidney disease, unspecified: Secondary | ICD-10-CM | POA: Diagnosis not present

## 2018-04-25 DIAGNOSIS — Z0000 Encounter for general adult medical examination without abnormal findings: Secondary | ICD-10-CM | POA: Diagnosis not present

## 2018-04-25 DIAGNOSIS — F250 Schizoaffective disorder, bipolar type: Secondary | ICD-10-CM | POA: Diagnosis not present

## 2018-04-25 DIAGNOSIS — K5904 Chronic idiopathic constipation: Secondary | ICD-10-CM | POA: Diagnosis not present

## 2018-04-25 DIAGNOSIS — K080 Exfoliation of teeth due to systemic causes: Secondary | ICD-10-CM | POA: Diagnosis not present

## 2018-04-25 DIAGNOSIS — K219 Gastro-esophageal reflux disease without esophagitis: Secondary | ICD-10-CM | POA: Diagnosis not present

## 2018-04-25 DIAGNOSIS — D553 Anemia due to disorders of nucleotide metabolism: Secondary | ICD-10-CM | POA: Diagnosis not present

## 2018-04-26 DIAGNOSIS — R251 Tremor, unspecified: Secondary | ICD-10-CM | POA: Diagnosis not present

## 2018-04-26 DIAGNOSIS — R32 Unspecified urinary incontinence: Secondary | ICD-10-CM | POA: Diagnosis not present

## 2018-04-26 DIAGNOSIS — N3090 Cystitis, unspecified without hematuria: Secondary | ICD-10-CM | POA: Diagnosis present

## 2018-04-26 DIAGNOSIS — R6889 Other general symptoms and signs: Secondary | ICD-10-CM | POA: Diagnosis not present

## 2018-04-26 DIAGNOSIS — F17210 Nicotine dependence, cigarettes, uncomplicated: Secondary | ICD-10-CM | POA: Diagnosis present

## 2018-04-26 DIAGNOSIS — Z79899 Other long term (current) drug therapy: Secondary | ICD-10-CM | POA: Diagnosis not present

## 2018-04-26 DIAGNOSIS — R109 Unspecified abdominal pain: Secondary | ICD-10-CM | POA: Diagnosis not present

## 2018-04-26 DIAGNOSIS — N183 Chronic kidney disease, stage 3 (moderate): Secondary | ICD-10-CM | POA: Diagnosis not present

## 2018-04-26 DIAGNOSIS — F250 Schizoaffective disorder, bipolar type: Secondary | ICD-10-CM | POA: Diagnosis present

## 2018-04-26 DIAGNOSIS — F068 Other specified mental disorders due to known physiological condition: Secondary | ICD-10-CM | POA: Diagnosis not present

## 2018-04-26 DIAGNOSIS — F22 Delusional disorders: Secondary | ICD-10-CM | POA: Diagnosis not present

## 2018-04-26 DIAGNOSIS — R4189 Other symptoms and signs involving cognitive functions and awareness: Secondary | ICD-10-CM | POA: Diagnosis not present

## 2018-04-26 DIAGNOSIS — A419 Sepsis, unspecified organism: Secondary | ICD-10-CM | POA: Diagnosis not present

## 2018-04-26 DIAGNOSIS — F319 Bipolar disorder, unspecified: Secondary | ICD-10-CM | POA: Diagnosis not present

## 2018-04-26 DIAGNOSIS — F29 Unspecified psychosis not due to a substance or known physiological condition: Secondary | ICD-10-CM | POA: Diagnosis not present

## 2018-04-26 DIAGNOSIS — R569 Unspecified convulsions: Secondary | ICD-10-CM | POA: Diagnosis not present

## 2018-04-26 DIAGNOSIS — N184 Chronic kidney disease, stage 4 (severe): Secondary | ICD-10-CM | POA: Diagnosis not present

## 2018-04-26 DIAGNOSIS — N10 Acute tubulo-interstitial nephritis: Secondary | ICD-10-CM | POA: Diagnosis present

## 2018-04-26 DIAGNOSIS — F0390 Unspecified dementia without behavioral disturbance: Secondary | ICD-10-CM | POA: Diagnosis present

## 2018-04-26 DIAGNOSIS — I10 Essential (primary) hypertension: Secondary | ICD-10-CM | POA: Diagnosis not present

## 2018-04-26 DIAGNOSIS — K219 Gastro-esophageal reflux disease without esophagitis: Secondary | ICD-10-CM | POA: Diagnosis present

## 2018-04-26 DIAGNOSIS — F209 Schizophrenia, unspecified: Secondary | ICD-10-CM | POA: Diagnosis not present

## 2018-04-26 DIAGNOSIS — E785 Hyperlipidemia, unspecified: Secondary | ICD-10-CM | POA: Diagnosis present

## 2018-04-26 DIAGNOSIS — I129 Hypertensive chronic kidney disease with stage 1 through stage 4 chronic kidney disease, or unspecified chronic kidney disease: Secondary | ICD-10-CM | POA: Diagnosis not present

## 2018-04-26 DIAGNOSIS — E870 Hyperosmolality and hypernatremia: Secondary | ICD-10-CM | POA: Diagnosis not present

## 2018-04-26 DIAGNOSIS — N179 Acute kidney failure, unspecified: Secondary | ICD-10-CM | POA: Diagnosis not present

## 2018-04-26 DIAGNOSIS — F603 Borderline personality disorder: Secondary | ICD-10-CM | POA: Diagnosis not present

## 2018-05-01 DIAGNOSIS — F250 Schizoaffective disorder, bipolar type: Secondary | ICD-10-CM | POA: Diagnosis not present

## 2018-05-07 DIAGNOSIS — F5101 Primary insomnia: Secondary | ICD-10-CM | POA: Diagnosis not present

## 2018-05-07 DIAGNOSIS — F4312 Post-traumatic stress disorder, chronic: Secondary | ICD-10-CM | POA: Diagnosis not present

## 2018-05-07 DIAGNOSIS — F0280 Dementia in other diseases classified elsewhere without behavioral disturbance: Secondary | ICD-10-CM | POA: Diagnosis not present

## 2018-05-07 DIAGNOSIS — F17200 Nicotine dependence, unspecified, uncomplicated: Secondary | ICD-10-CM | POA: Diagnosis not present

## 2018-05-07 DIAGNOSIS — F250 Schizoaffective disorder, bipolar type: Secondary | ICD-10-CM | POA: Diagnosis not present

## 2018-05-09 DIAGNOSIS — F4312 Post-traumatic stress disorder, chronic: Secondary | ICD-10-CM | POA: Diagnosis not present

## 2018-05-09 DIAGNOSIS — F5101 Primary insomnia: Secondary | ICD-10-CM | POA: Diagnosis not present

## 2018-05-09 DIAGNOSIS — F0150 Vascular dementia without behavioral disturbance: Secondary | ICD-10-CM | POA: Diagnosis not present

## 2018-05-09 DIAGNOSIS — N189 Chronic kidney disease, unspecified: Secondary | ICD-10-CM | POA: Diagnosis not present

## 2018-05-09 DIAGNOSIS — K5904 Chronic idiopathic constipation: Secondary | ICD-10-CM | POA: Diagnosis not present

## 2018-05-09 DIAGNOSIS — F0280 Dementia in other diseases classified elsewhere without behavioral disturbance: Secondary | ICD-10-CM | POA: Diagnosis not present

## 2018-05-09 DIAGNOSIS — Z0000 Encounter for general adult medical examination without abnormal findings: Secondary | ICD-10-CM | POA: Diagnosis not present

## 2018-05-09 DIAGNOSIS — I10 Essential (primary) hypertension: Secondary | ICD-10-CM | POA: Diagnosis not present

## 2018-05-09 DIAGNOSIS — D553 Anemia due to disorders of nucleotide metabolism: Secondary | ICD-10-CM | POA: Diagnosis not present

## 2018-05-09 DIAGNOSIS — K219 Gastro-esophageal reflux disease without esophagitis: Secondary | ICD-10-CM | POA: Diagnosis not present

## 2018-05-09 DIAGNOSIS — K080 Exfoliation of teeth due to systemic causes: Secondary | ICD-10-CM | POA: Diagnosis not present

## 2018-05-09 DIAGNOSIS — F17200 Nicotine dependence, unspecified, uncomplicated: Secondary | ICD-10-CM | POA: Diagnosis not present

## 2018-05-09 DIAGNOSIS — Z933 Colostomy status: Secondary | ICD-10-CM | POA: Diagnosis not present

## 2018-05-09 DIAGNOSIS — F250 Schizoaffective disorder, bipolar type: Secondary | ICD-10-CM | POA: Diagnosis not present

## 2018-05-16 DIAGNOSIS — E559 Vitamin D deficiency, unspecified: Secondary | ICD-10-CM | POA: Diagnosis not present

## 2018-05-16 DIAGNOSIS — I10 Essential (primary) hypertension: Secondary | ICD-10-CM | POA: Diagnosis not present

## 2018-05-16 DIAGNOSIS — E119 Type 2 diabetes mellitus without complications: Secondary | ICD-10-CM | POA: Diagnosis not present

## 2018-05-16 DIAGNOSIS — D649 Anemia, unspecified: Secondary | ICD-10-CM | POA: Diagnosis not present

## 2018-05-16 DIAGNOSIS — F0390 Unspecified dementia without behavioral disturbance: Secondary | ICD-10-CM | POA: Diagnosis not present

## 2018-05-16 DIAGNOSIS — N400 Enlarged prostate without lower urinary tract symptoms: Secondary | ICD-10-CM | POA: Diagnosis not present

## 2018-05-16 DIAGNOSIS — E039 Hypothyroidism, unspecified: Secondary | ICD-10-CM | POA: Diagnosis not present

## 2018-05-16 DIAGNOSIS — N189 Chronic kidney disease, unspecified: Secondary | ICD-10-CM | POA: Diagnosis not present

## 2018-05-16 DIAGNOSIS — G40909 Epilepsy, unspecified, not intractable, without status epilepticus: Secondary | ICD-10-CM | POA: Diagnosis not present

## 2018-05-21 DIAGNOSIS — F5101 Primary insomnia: Secondary | ICD-10-CM | POA: Diagnosis not present

## 2018-05-21 DIAGNOSIS — F4312 Post-traumatic stress disorder, chronic: Secondary | ICD-10-CM | POA: Diagnosis not present

## 2018-05-21 DIAGNOSIS — F250 Schizoaffective disorder, bipolar type: Secondary | ICD-10-CM | POA: Diagnosis not present

## 2018-05-21 DIAGNOSIS — F17200 Nicotine dependence, unspecified, uncomplicated: Secondary | ICD-10-CM | POA: Diagnosis not present

## 2018-05-21 DIAGNOSIS — F0280 Dementia in other diseases classified elsewhere without behavioral disturbance: Secondary | ICD-10-CM | POA: Diagnosis not present

## 2018-05-23 DIAGNOSIS — D553 Anemia due to disorders of nucleotide metabolism: Secondary | ICD-10-CM | POA: Diagnosis not present

## 2018-05-23 DIAGNOSIS — F0150 Vascular dementia without behavioral disturbance: Secondary | ICD-10-CM | POA: Diagnosis not present

## 2018-05-23 DIAGNOSIS — K219 Gastro-esophageal reflux disease without esophagitis: Secondary | ICD-10-CM | POA: Diagnosis not present

## 2018-05-23 DIAGNOSIS — F250 Schizoaffective disorder, bipolar type: Secondary | ICD-10-CM | POA: Diagnosis not present

## 2018-05-23 DIAGNOSIS — K080 Exfoliation of teeth due to systemic causes: Secondary | ICD-10-CM | POA: Diagnosis not present

## 2018-05-23 DIAGNOSIS — K5904 Chronic idiopathic constipation: Secondary | ICD-10-CM | POA: Diagnosis not present

## 2018-05-23 DIAGNOSIS — N189 Chronic kidney disease, unspecified: Secondary | ICD-10-CM | POA: Diagnosis not present

## 2018-05-23 DIAGNOSIS — I10 Essential (primary) hypertension: Secondary | ICD-10-CM | POA: Diagnosis not present

## 2018-05-23 DIAGNOSIS — Z933 Colostomy status: Secondary | ICD-10-CM | POA: Diagnosis not present

## 2018-05-27 DIAGNOSIS — F250 Schizoaffective disorder, bipolar type: Secondary | ICD-10-CM | POA: Diagnosis not present

## 2018-05-27 DIAGNOSIS — Z933 Colostomy status: Secondary | ICD-10-CM | POA: Diagnosis not present

## 2018-05-27 DIAGNOSIS — F17210 Nicotine dependence, cigarettes, uncomplicated: Secondary | ICD-10-CM | POA: Diagnosis not present

## 2018-05-27 DIAGNOSIS — Z8614 Personal history of Methicillin resistant Staphylococcus aureus infection: Secondary | ICD-10-CM | POA: Diagnosis not present

## 2018-05-29 DIAGNOSIS — F250 Schizoaffective disorder, bipolar type: Secondary | ICD-10-CM | POA: Diagnosis not present

## 2018-06-01 DIAGNOSIS — I10 Essential (primary) hypertension: Secondary | ICD-10-CM | POA: Diagnosis not present

## 2018-06-01 DIAGNOSIS — N189 Chronic kidney disease, unspecified: Secondary | ICD-10-CM | POA: Diagnosis not present

## 2018-06-04 DIAGNOSIS — F17200 Nicotine dependence, unspecified, uncomplicated: Secondary | ICD-10-CM | POA: Diagnosis not present

## 2018-06-04 DIAGNOSIS — F4312 Post-traumatic stress disorder, chronic: Secondary | ICD-10-CM | POA: Diagnosis not present

## 2018-06-04 DIAGNOSIS — F250 Schizoaffective disorder, bipolar type: Secondary | ICD-10-CM | POA: Diagnosis not present

## 2018-06-04 DIAGNOSIS — F0280 Dementia in other diseases classified elsewhere without behavioral disturbance: Secondary | ICD-10-CM | POA: Diagnosis not present

## 2018-06-04 DIAGNOSIS — F5101 Primary insomnia: Secondary | ICD-10-CM | POA: Diagnosis not present

## 2018-06-06 DIAGNOSIS — Z0000 Encounter for general adult medical examination without abnormal findings: Secondary | ICD-10-CM | POA: Diagnosis not present

## 2018-06-06 DIAGNOSIS — N390 Urinary tract infection, site not specified: Secondary | ICD-10-CM | POA: Diagnosis not present

## 2018-06-12 DIAGNOSIS — F250 Schizoaffective disorder, bipolar type: Secondary | ICD-10-CM | POA: Diagnosis not present

## 2018-06-12 DIAGNOSIS — F0390 Unspecified dementia without behavioral disturbance: Secondary | ICD-10-CM | POA: Diagnosis not present

## 2018-06-20 DIAGNOSIS — I10 Essential (primary) hypertension: Secondary | ICD-10-CM | POA: Diagnosis not present

## 2018-06-20 DIAGNOSIS — F0150 Vascular dementia without behavioral disturbance: Secondary | ICD-10-CM | POA: Diagnosis not present

## 2018-06-20 DIAGNOSIS — Z933 Colostomy status: Secondary | ICD-10-CM | POA: Diagnosis not present

## 2018-06-20 DIAGNOSIS — F250 Schizoaffective disorder, bipolar type: Secondary | ICD-10-CM | POA: Diagnosis not present

## 2018-06-20 DIAGNOSIS — K080 Exfoliation of teeth due to systemic causes: Secondary | ICD-10-CM | POA: Diagnosis not present

## 2018-06-20 DIAGNOSIS — K219 Gastro-esophageal reflux disease without esophagitis: Secondary | ICD-10-CM | POA: Diagnosis not present

## 2018-06-20 DIAGNOSIS — E559 Vitamin D deficiency, unspecified: Secondary | ICD-10-CM | POA: Diagnosis not present

## 2018-06-20 DIAGNOSIS — D553 Anemia due to disorders of nucleotide metabolism: Secondary | ICD-10-CM | POA: Diagnosis not present

## 2018-06-20 DIAGNOSIS — K5904 Chronic idiopathic constipation: Secondary | ICD-10-CM | POA: Diagnosis not present

## 2018-06-20 DIAGNOSIS — N189 Chronic kidney disease, unspecified: Secondary | ICD-10-CM | POA: Diagnosis not present

## 2018-07-02 DIAGNOSIS — F17200 Nicotine dependence, unspecified, uncomplicated: Secondary | ICD-10-CM | POA: Diagnosis not present

## 2018-07-02 DIAGNOSIS — F250 Schizoaffective disorder, bipolar type: Secondary | ICD-10-CM | POA: Diagnosis not present

## 2018-07-02 DIAGNOSIS — F0280 Dementia in other diseases classified elsewhere without behavioral disturbance: Secondary | ICD-10-CM | POA: Diagnosis not present

## 2018-07-02 DIAGNOSIS — F5101 Primary insomnia: Secondary | ICD-10-CM | POA: Diagnosis not present

## 2018-07-02 DIAGNOSIS — F4312 Post-traumatic stress disorder, chronic: Secondary | ICD-10-CM | POA: Diagnosis not present

## 2018-07-04 DIAGNOSIS — Z0000 Encounter for general adult medical examination without abnormal findings: Secondary | ICD-10-CM | POA: Diagnosis not present

## 2018-07-05 DIAGNOSIS — N189 Chronic kidney disease, unspecified: Secondary | ICD-10-CM | POA: Diagnosis not present

## 2018-07-05 DIAGNOSIS — I129 Hypertensive chronic kidney disease with stage 1 through stage 4 chronic kidney disease, or unspecified chronic kidney disease: Secondary | ICD-10-CM | POA: Diagnosis not present

## 2018-07-05 DIAGNOSIS — F250 Schizoaffective disorder, bipolar type: Secondary | ICD-10-CM | POA: Diagnosis not present

## 2018-07-05 DIAGNOSIS — F0390 Unspecified dementia without behavioral disturbance: Secondary | ICD-10-CM | POA: Diagnosis not present

## 2018-07-17 DIAGNOSIS — F250 Schizoaffective disorder, bipolar type: Secondary | ICD-10-CM | POA: Diagnosis not present

## 2018-07-25 DIAGNOSIS — I10 Essential (primary) hypertension: Secondary | ICD-10-CM | POA: Diagnosis not present

## 2018-07-25 DIAGNOSIS — Z0189 Encounter for other specified special examinations: Secondary | ICD-10-CM | POA: Diagnosis not present

## 2018-07-25 DIAGNOSIS — E782 Mixed hyperlipidemia: Secondary | ICD-10-CM | POA: Diagnosis not present

## 2018-07-30 DIAGNOSIS — F250 Schizoaffective disorder, bipolar type: Secondary | ICD-10-CM | POA: Diagnosis not present

## 2018-07-30 DIAGNOSIS — F0280 Dementia in other diseases classified elsewhere without behavioral disturbance: Secondary | ICD-10-CM | POA: Diagnosis not present

## 2018-07-30 DIAGNOSIS — F17200 Nicotine dependence, unspecified, uncomplicated: Secondary | ICD-10-CM | POA: Diagnosis not present

## 2018-07-30 DIAGNOSIS — F4312 Post-traumatic stress disorder, chronic: Secondary | ICD-10-CM | POA: Diagnosis not present

## 2018-07-30 DIAGNOSIS — F5101 Primary insomnia: Secondary | ICD-10-CM | POA: Diagnosis not present

## 2018-08-01 DIAGNOSIS — Z0000 Encounter for general adult medical examination without abnormal findings: Secondary | ICD-10-CM | POA: Diagnosis not present

## 2018-08-01 DIAGNOSIS — E782 Mixed hyperlipidemia: Secondary | ICD-10-CM | POA: Diagnosis not present

## 2018-08-01 DIAGNOSIS — I10 Essential (primary) hypertension: Secondary | ICD-10-CM | POA: Diagnosis not present

## 2018-08-02 DIAGNOSIS — N189 Chronic kidney disease, unspecified: Secondary | ICD-10-CM | POA: Diagnosis not present

## 2018-08-02 DIAGNOSIS — I129 Hypertensive chronic kidney disease with stage 1 through stage 4 chronic kidney disease, or unspecified chronic kidney disease: Secondary | ICD-10-CM | POA: Diagnosis not present

## 2018-08-02 DIAGNOSIS — F250 Schizoaffective disorder, bipolar type: Secondary | ICD-10-CM | POA: Diagnosis not present

## 2018-08-07 DIAGNOSIS — I129 Hypertensive chronic kidney disease with stage 1 through stage 4 chronic kidney disease, or unspecified chronic kidney disease: Secondary | ICD-10-CM | POA: Diagnosis not present

## 2018-08-07 DIAGNOSIS — K5909 Other constipation: Secondary | ICD-10-CM | POA: Diagnosis not present

## 2018-08-07 DIAGNOSIS — D509 Iron deficiency anemia, unspecified: Secondary | ICD-10-CM | POA: Diagnosis not present

## 2018-08-07 DIAGNOSIS — F250 Schizoaffective disorder, bipolar type: Secondary | ICD-10-CM | POA: Diagnosis not present

## 2018-08-07 DIAGNOSIS — N189 Chronic kidney disease, unspecified: Secondary | ICD-10-CM | POA: Diagnosis not present

## 2018-08-07 DIAGNOSIS — Z433 Encounter for attention to colostomy: Secondary | ICD-10-CM | POA: Diagnosis not present

## 2018-08-07 DIAGNOSIS — F4312 Post-traumatic stress disorder, chronic: Secondary | ICD-10-CM | POA: Diagnosis not present

## 2018-08-07 DIAGNOSIS — F2089 Other schizophrenia: Secondary | ICD-10-CM | POA: Diagnosis not present

## 2018-08-07 DIAGNOSIS — F5101 Primary insomnia: Secondary | ICD-10-CM | POA: Diagnosis not present

## 2018-08-07 DIAGNOSIS — K219 Gastro-esophageal reflux disease without esophagitis: Secondary | ICD-10-CM | POA: Diagnosis not present

## 2018-08-13 DIAGNOSIS — F0280 Dementia in other diseases classified elsewhere without behavioral disturbance: Secondary | ICD-10-CM | POA: Diagnosis not present

## 2018-08-13 DIAGNOSIS — F250 Schizoaffective disorder, bipolar type: Secondary | ICD-10-CM | POA: Diagnosis not present

## 2018-08-13 DIAGNOSIS — N189 Chronic kidney disease, unspecified: Secondary | ICD-10-CM | POA: Diagnosis not present

## 2018-08-13 DIAGNOSIS — F5101 Primary insomnia: Secondary | ICD-10-CM | POA: Diagnosis not present

## 2018-08-13 DIAGNOSIS — F4312 Post-traumatic stress disorder, chronic: Secondary | ICD-10-CM | POA: Diagnosis not present

## 2018-08-13 DIAGNOSIS — F17200 Nicotine dependence, unspecified, uncomplicated: Secondary | ICD-10-CM | POA: Diagnosis not present

## 2018-08-13 DIAGNOSIS — F2089 Other schizophrenia: Secondary | ICD-10-CM | POA: Diagnosis not present

## 2018-08-13 DIAGNOSIS — I129 Hypertensive chronic kidney disease with stage 1 through stage 4 chronic kidney disease, or unspecified chronic kidney disease: Secondary | ICD-10-CM | POA: Diagnosis not present

## 2018-08-13 DIAGNOSIS — D509 Iron deficiency anemia, unspecified: Secondary | ICD-10-CM | POA: Diagnosis not present

## 2018-08-13 DIAGNOSIS — Z433 Encounter for attention to colostomy: Secondary | ICD-10-CM | POA: Diagnosis not present

## 2018-08-15 DIAGNOSIS — D509 Iron deficiency anemia, unspecified: Secondary | ICD-10-CM | POA: Diagnosis not present

## 2018-08-15 DIAGNOSIS — F0150 Vascular dementia without behavioral disturbance: Secondary | ICD-10-CM | POA: Diagnosis not present

## 2018-08-15 DIAGNOSIS — K080 Exfoliation of teeth due to systemic causes: Secondary | ICD-10-CM | POA: Diagnosis not present

## 2018-08-15 DIAGNOSIS — I129 Hypertensive chronic kidney disease with stage 1 through stage 4 chronic kidney disease, or unspecified chronic kidney disease: Secondary | ICD-10-CM | POA: Diagnosis not present

## 2018-08-15 DIAGNOSIS — K5904 Chronic idiopathic constipation: Secondary | ICD-10-CM | POA: Diagnosis not present

## 2018-08-15 DIAGNOSIS — Z933 Colostomy status: Secondary | ICD-10-CM | POA: Diagnosis not present

## 2018-08-15 DIAGNOSIS — K219 Gastro-esophageal reflux disease without esophagitis: Secondary | ICD-10-CM | POA: Diagnosis not present

## 2018-08-15 DIAGNOSIS — Z433 Encounter for attention to colostomy: Secondary | ICD-10-CM | POA: Diagnosis not present

## 2018-08-15 DIAGNOSIS — E559 Vitamin D deficiency, unspecified: Secondary | ICD-10-CM | POA: Diagnosis not present

## 2018-08-15 DIAGNOSIS — D553 Anemia due to disorders of nucleotide metabolism: Secondary | ICD-10-CM | POA: Diagnosis not present

## 2018-08-15 DIAGNOSIS — N189 Chronic kidney disease, unspecified: Secondary | ICD-10-CM | POA: Diagnosis not present

## 2018-08-15 DIAGNOSIS — F4312 Post-traumatic stress disorder, chronic: Secondary | ICD-10-CM | POA: Diagnosis not present

## 2018-08-15 DIAGNOSIS — F2089 Other schizophrenia: Secondary | ICD-10-CM | POA: Diagnosis not present

## 2018-08-15 DIAGNOSIS — F250 Schizoaffective disorder, bipolar type: Secondary | ICD-10-CM | POA: Diagnosis not present

## 2018-08-15 DIAGNOSIS — I4891 Unspecified atrial fibrillation: Secondary | ICD-10-CM | POA: Diagnosis not present

## 2018-08-15 DIAGNOSIS — I10 Essential (primary) hypertension: Secondary | ICD-10-CM | POA: Diagnosis not present

## 2018-08-19 DIAGNOSIS — F4312 Post-traumatic stress disorder, chronic: Secondary | ICD-10-CM | POA: Diagnosis not present

## 2018-08-19 DIAGNOSIS — F2089 Other schizophrenia: Secondary | ICD-10-CM | POA: Diagnosis not present

## 2018-08-19 DIAGNOSIS — D509 Iron deficiency anemia, unspecified: Secondary | ICD-10-CM | POA: Diagnosis not present

## 2018-08-19 DIAGNOSIS — Z433 Encounter for attention to colostomy: Secondary | ICD-10-CM | POA: Diagnosis not present

## 2018-08-19 DIAGNOSIS — N189 Chronic kidney disease, unspecified: Secondary | ICD-10-CM | POA: Diagnosis not present

## 2018-08-19 DIAGNOSIS — I129 Hypertensive chronic kidney disease with stage 1 through stage 4 chronic kidney disease, or unspecified chronic kidney disease: Secondary | ICD-10-CM | POA: Diagnosis not present

## 2018-08-26 DIAGNOSIS — Z433 Encounter for attention to colostomy: Secondary | ICD-10-CM | POA: Diagnosis not present

## 2018-08-26 DIAGNOSIS — I129 Hypertensive chronic kidney disease with stage 1 through stage 4 chronic kidney disease, or unspecified chronic kidney disease: Secondary | ICD-10-CM | POA: Diagnosis not present

## 2018-08-26 DIAGNOSIS — D509 Iron deficiency anemia, unspecified: Secondary | ICD-10-CM | POA: Diagnosis not present

## 2018-08-26 DIAGNOSIS — F4312 Post-traumatic stress disorder, chronic: Secondary | ICD-10-CM | POA: Diagnosis not present

## 2018-08-26 DIAGNOSIS — N189 Chronic kidney disease, unspecified: Secondary | ICD-10-CM | POA: Diagnosis not present

## 2018-08-26 DIAGNOSIS — F2089 Other schizophrenia: Secondary | ICD-10-CM | POA: Diagnosis not present

## 2018-08-28 DIAGNOSIS — H2513 Age-related nuclear cataract, bilateral: Secondary | ICD-10-CM | POA: Diagnosis not present

## 2018-08-29 DIAGNOSIS — K080 Exfoliation of teeth due to systemic causes: Secondary | ICD-10-CM | POA: Diagnosis not present

## 2018-08-29 DIAGNOSIS — E559 Vitamin D deficiency, unspecified: Secondary | ICD-10-CM | POA: Diagnosis not present

## 2018-08-29 DIAGNOSIS — Z933 Colostomy status: Secondary | ICD-10-CM | POA: Diagnosis not present

## 2018-08-29 DIAGNOSIS — N390 Urinary tract infection, site not specified: Secondary | ICD-10-CM | POA: Diagnosis not present

## 2018-08-29 DIAGNOSIS — D553 Anemia due to disorders of nucleotide metabolism: Secondary | ICD-10-CM | POA: Diagnosis not present

## 2018-08-29 DIAGNOSIS — K5904 Chronic idiopathic constipation: Secondary | ICD-10-CM | POA: Diagnosis not present

## 2018-08-29 DIAGNOSIS — F250 Schizoaffective disorder, bipolar type: Secondary | ICD-10-CM | POA: Diagnosis not present

## 2018-08-29 DIAGNOSIS — K219 Gastro-esophageal reflux disease without esophagitis: Secondary | ICD-10-CM | POA: Diagnosis not present

## 2018-08-29 DIAGNOSIS — I10 Essential (primary) hypertension: Secondary | ICD-10-CM | POA: Diagnosis not present

## 2018-08-29 DIAGNOSIS — F0150 Vascular dementia without behavioral disturbance: Secondary | ICD-10-CM | POA: Diagnosis not present

## 2018-08-29 DIAGNOSIS — N189 Chronic kidney disease, unspecified: Secondary | ICD-10-CM | POA: Diagnosis not present

## 2018-08-30 DIAGNOSIS — D539 Nutritional anemia, unspecified: Secondary | ICD-10-CM | POA: Diagnosis not present

## 2018-08-30 DIAGNOSIS — Z933 Colostomy status: Secondary | ICD-10-CM | POA: Diagnosis not present

## 2018-08-30 DIAGNOSIS — F250 Schizoaffective disorder, bipolar type: Secondary | ICD-10-CM | POA: Diagnosis not present

## 2018-08-30 DIAGNOSIS — F17210 Nicotine dependence, cigarettes, uncomplicated: Secondary | ICD-10-CM | POA: Diagnosis not present

## 2018-08-30 DIAGNOSIS — I129 Hypertensive chronic kidney disease with stage 1 through stage 4 chronic kidney disease, or unspecified chronic kidney disease: Secondary | ICD-10-CM | POA: Diagnosis not present

## 2018-08-30 DIAGNOSIS — N189 Chronic kidney disease, unspecified: Secondary | ICD-10-CM | POA: Diagnosis not present

## 2018-08-30 DIAGNOSIS — K219 Gastro-esophageal reflux disease without esophagitis: Secondary | ICD-10-CM | POA: Diagnosis not present

## 2018-08-30 DIAGNOSIS — N183 Chronic kidney disease, stage 3 (moderate): Secondary | ICD-10-CM | POA: Diagnosis not present

## 2018-08-31 DIAGNOSIS — D508 Other iron deficiency anemias: Secondary | ICD-10-CM | POA: Diagnosis not present

## 2018-08-31 DIAGNOSIS — N189 Chronic kidney disease, unspecified: Secondary | ICD-10-CM | POA: Diagnosis not present

## 2018-09-03 DIAGNOSIS — Z433 Encounter for attention to colostomy: Secondary | ICD-10-CM | POA: Diagnosis not present

## 2018-09-03 DIAGNOSIS — F2089 Other schizophrenia: Secondary | ICD-10-CM | POA: Diagnosis not present

## 2018-09-03 DIAGNOSIS — F4312 Post-traumatic stress disorder, chronic: Secondary | ICD-10-CM | POA: Diagnosis not present

## 2018-09-03 DIAGNOSIS — N189 Chronic kidney disease, unspecified: Secondary | ICD-10-CM | POA: Diagnosis not present

## 2018-09-03 DIAGNOSIS — D509 Iron deficiency anemia, unspecified: Secondary | ICD-10-CM | POA: Diagnosis not present

## 2018-09-03 DIAGNOSIS — I129 Hypertensive chronic kidney disease with stage 1 through stage 4 chronic kidney disease, or unspecified chronic kidney disease: Secondary | ICD-10-CM | POA: Diagnosis not present

## 2018-09-05 DIAGNOSIS — D649 Anemia, unspecified: Secondary | ICD-10-CM | POA: Diagnosis not present

## 2018-09-06 DIAGNOSIS — Z433 Encounter for attention to colostomy: Secondary | ICD-10-CM | POA: Diagnosis not present

## 2018-09-06 DIAGNOSIS — F5101 Primary insomnia: Secondary | ICD-10-CM | POA: Diagnosis not present

## 2018-09-06 DIAGNOSIS — K219 Gastro-esophageal reflux disease without esophagitis: Secondary | ICD-10-CM | POA: Diagnosis not present

## 2018-09-06 DIAGNOSIS — N189 Chronic kidney disease, unspecified: Secondary | ICD-10-CM | POA: Diagnosis not present

## 2018-09-06 DIAGNOSIS — I129 Hypertensive chronic kidney disease with stage 1 through stage 4 chronic kidney disease, or unspecified chronic kidney disease: Secondary | ICD-10-CM | POA: Diagnosis not present

## 2018-09-06 DIAGNOSIS — F2089 Other schizophrenia: Secondary | ICD-10-CM | POA: Diagnosis not present

## 2018-09-06 DIAGNOSIS — D509 Iron deficiency anemia, unspecified: Secondary | ICD-10-CM | POA: Diagnosis not present

## 2018-09-06 DIAGNOSIS — F4312 Post-traumatic stress disorder, chronic: Secondary | ICD-10-CM | POA: Diagnosis not present

## 2018-09-06 DIAGNOSIS — K5909 Other constipation: Secondary | ICD-10-CM | POA: Diagnosis not present

## 2018-09-09 DIAGNOSIS — F4312 Post-traumatic stress disorder, chronic: Secondary | ICD-10-CM | POA: Diagnosis not present

## 2018-09-09 DIAGNOSIS — N189 Chronic kidney disease, unspecified: Secondary | ICD-10-CM | POA: Diagnosis not present

## 2018-09-09 DIAGNOSIS — D509 Iron deficiency anemia, unspecified: Secondary | ICD-10-CM | POA: Diagnosis not present

## 2018-09-09 DIAGNOSIS — I129 Hypertensive chronic kidney disease with stage 1 through stage 4 chronic kidney disease, or unspecified chronic kidney disease: Secondary | ICD-10-CM | POA: Diagnosis not present

## 2018-09-09 DIAGNOSIS — F2089 Other schizophrenia: Secondary | ICD-10-CM | POA: Diagnosis not present

## 2018-09-09 DIAGNOSIS — Z433 Encounter for attention to colostomy: Secondary | ICD-10-CM | POA: Diagnosis not present

## 2018-09-10 DIAGNOSIS — F5101 Primary insomnia: Secondary | ICD-10-CM | POA: Diagnosis not present

## 2018-09-10 DIAGNOSIS — F17200 Nicotine dependence, unspecified, uncomplicated: Secondary | ICD-10-CM | POA: Diagnosis not present

## 2018-09-10 DIAGNOSIS — F250 Schizoaffective disorder, bipolar type: Secondary | ICD-10-CM | POA: Diagnosis not present

## 2018-09-10 DIAGNOSIS — F0280 Dementia in other diseases classified elsewhere without behavioral disturbance: Secondary | ICD-10-CM | POA: Diagnosis not present

## 2018-09-10 DIAGNOSIS — F4312 Post-traumatic stress disorder, chronic: Secondary | ICD-10-CM | POA: Diagnosis not present

## 2018-09-11 DIAGNOSIS — F5101 Primary insomnia: Secondary | ICD-10-CM | POA: Diagnosis not present

## 2018-09-11 DIAGNOSIS — F0280 Dementia in other diseases classified elsewhere without behavioral disturbance: Secondary | ICD-10-CM | POA: Diagnosis not present

## 2018-09-11 DIAGNOSIS — F17200 Nicotine dependence, unspecified, uncomplicated: Secondary | ICD-10-CM | POA: Diagnosis not present

## 2018-09-11 DIAGNOSIS — F4312 Post-traumatic stress disorder, chronic: Secondary | ICD-10-CM | POA: Diagnosis not present

## 2018-09-11 DIAGNOSIS — F250 Schizoaffective disorder, bipolar type: Secondary | ICD-10-CM | POA: Diagnosis not present

## 2018-09-12 DIAGNOSIS — I10 Essential (primary) hypertension: Secondary | ICD-10-CM | POA: Diagnosis not present

## 2018-09-12 DIAGNOSIS — D553 Anemia due to disorders of nucleotide metabolism: Secondary | ICD-10-CM | POA: Diagnosis not present

## 2018-09-12 DIAGNOSIS — N189 Chronic kidney disease, unspecified: Secondary | ICD-10-CM | POA: Diagnosis not present

## 2018-09-12 DIAGNOSIS — E559 Vitamin D deficiency, unspecified: Secondary | ICD-10-CM | POA: Diagnosis not present

## 2018-09-12 DIAGNOSIS — F0150 Vascular dementia without behavioral disturbance: Secondary | ICD-10-CM | POA: Diagnosis not present

## 2018-09-12 DIAGNOSIS — K080 Exfoliation of teeth due to systemic causes: Secondary | ICD-10-CM | POA: Diagnosis not present

## 2018-09-12 DIAGNOSIS — Z933 Colostomy status: Secondary | ICD-10-CM | POA: Diagnosis not present

## 2018-09-12 DIAGNOSIS — K219 Gastro-esophageal reflux disease without esophagitis: Secondary | ICD-10-CM | POA: Diagnosis not present

## 2018-09-12 DIAGNOSIS — K5904 Chronic idiopathic constipation: Secondary | ICD-10-CM | POA: Diagnosis not present

## 2018-09-12 DIAGNOSIS — F250 Schizoaffective disorder, bipolar type: Secondary | ICD-10-CM | POA: Diagnosis not present

## 2018-09-19 DIAGNOSIS — D649 Anemia, unspecified: Secondary | ICD-10-CM | POA: Diagnosis not present

## 2018-09-19 DIAGNOSIS — G3184 Mild cognitive impairment, so stated: Secondary | ICD-10-CM | POA: Diagnosis not present

## 2018-09-19 DIAGNOSIS — N189 Chronic kidney disease, unspecified: Secondary | ICD-10-CM | POA: Diagnosis not present

## 2018-09-19 DIAGNOSIS — E119 Type 2 diabetes mellitus without complications: Secondary | ICD-10-CM | POA: Diagnosis not present

## 2018-09-19 DIAGNOSIS — E039 Hypothyroidism, unspecified: Secondary | ICD-10-CM | POA: Diagnosis not present

## 2018-09-19 DIAGNOSIS — I10 Essential (primary) hypertension: Secondary | ICD-10-CM | POA: Diagnosis not present

## 2018-09-20 DIAGNOSIS — I129 Hypertensive chronic kidney disease with stage 1 through stage 4 chronic kidney disease, or unspecified chronic kidney disease: Secondary | ICD-10-CM | POA: Diagnosis not present

## 2018-09-20 DIAGNOSIS — N189 Chronic kidney disease, unspecified: Secondary | ICD-10-CM | POA: Diagnosis not present

## 2018-09-20 DIAGNOSIS — Z433 Encounter for attention to colostomy: Secondary | ICD-10-CM | POA: Diagnosis not present

## 2018-09-20 DIAGNOSIS — F4312 Post-traumatic stress disorder, chronic: Secondary | ICD-10-CM | POA: Diagnosis not present

## 2018-09-20 DIAGNOSIS — D509 Iron deficiency anemia, unspecified: Secondary | ICD-10-CM | POA: Diagnosis not present

## 2018-09-20 DIAGNOSIS — F2089 Other schizophrenia: Secondary | ICD-10-CM | POA: Diagnosis not present

## 2018-09-24 DIAGNOSIS — F0280 Dementia in other diseases classified elsewhere without behavioral disturbance: Secondary | ICD-10-CM | POA: Diagnosis not present

## 2018-09-24 DIAGNOSIS — F250 Schizoaffective disorder, bipolar type: Secondary | ICD-10-CM | POA: Diagnosis not present

## 2018-09-24 DIAGNOSIS — F17200 Nicotine dependence, unspecified, uncomplicated: Secondary | ICD-10-CM | POA: Diagnosis not present

## 2018-09-24 DIAGNOSIS — F5101 Primary insomnia: Secondary | ICD-10-CM | POA: Diagnosis not present

## 2018-09-24 DIAGNOSIS — F4312 Post-traumatic stress disorder, chronic: Secondary | ICD-10-CM | POA: Diagnosis not present

## 2018-09-27 DIAGNOSIS — F250 Schizoaffective disorder, bipolar type: Secondary | ICD-10-CM | POA: Diagnosis not present

## 2018-09-30 DIAGNOSIS — Z8614 Personal history of Methicillin resistant Staphylococcus aureus infection: Secondary | ICD-10-CM | POA: Diagnosis not present

## 2018-09-30 DIAGNOSIS — Z720 Tobacco use: Secondary | ICD-10-CM | POA: Diagnosis not present

## 2018-09-30 DIAGNOSIS — H9319 Tinnitus, unspecified ear: Secondary | ICD-10-CM | POA: Diagnosis not present

## 2018-09-30 DIAGNOSIS — F250 Schizoaffective disorder, bipolar type: Secondary | ICD-10-CM | POA: Diagnosis not present

## 2018-09-30 DIAGNOSIS — K117 Disturbances of salivary secretion: Secondary | ICD-10-CM | POA: Diagnosis not present

## 2018-09-30 DIAGNOSIS — N183 Chronic kidney disease, stage 3 (moderate): Secondary | ICD-10-CM | POA: Diagnosis not present

## 2018-09-30 DIAGNOSIS — R32 Unspecified urinary incontinence: Secondary | ICD-10-CM | POA: Diagnosis not present

## 2018-09-30 DIAGNOSIS — I129 Hypertensive chronic kidney disease with stage 1 through stage 4 chronic kidney disease, or unspecified chronic kidney disease: Secondary | ICD-10-CM | POA: Diagnosis not present

## 2018-10-10 DIAGNOSIS — E559 Vitamin D deficiency, unspecified: Secondary | ICD-10-CM | POA: Diagnosis not present

## 2018-10-10 DIAGNOSIS — D553 Anemia due to disorders of nucleotide metabolism: Secondary | ICD-10-CM | POA: Diagnosis not present

## 2018-10-10 DIAGNOSIS — K219 Gastro-esophageal reflux disease without esophagitis: Secondary | ICD-10-CM | POA: Diagnosis not present

## 2018-10-10 DIAGNOSIS — I10 Essential (primary) hypertension: Secondary | ICD-10-CM | POA: Diagnosis not present

## 2018-10-10 DIAGNOSIS — K080 Exfoliation of teeth due to systemic causes: Secondary | ICD-10-CM | POA: Diagnosis not present

## 2018-10-10 DIAGNOSIS — N189 Chronic kidney disease, unspecified: Secondary | ICD-10-CM | POA: Diagnosis not present

## 2018-10-10 DIAGNOSIS — K5904 Chronic idiopathic constipation: Secondary | ICD-10-CM | POA: Diagnosis not present

## 2018-10-10 DIAGNOSIS — Z933 Colostomy status: Secondary | ICD-10-CM | POA: Diagnosis not present

## 2018-10-10 DIAGNOSIS — F0150 Vascular dementia without behavioral disturbance: Secondary | ICD-10-CM | POA: Diagnosis not present

## 2018-10-10 DIAGNOSIS — F250 Schizoaffective disorder, bipolar type: Secondary | ICD-10-CM | POA: Diagnosis not present

## 2018-10-16 DIAGNOSIS — F250 Schizoaffective disorder, bipolar type: Secondary | ICD-10-CM | POA: Diagnosis not present
